# Patient Record
Sex: Male | Born: 1975 | Race: White | Hispanic: No | Smoking: Current every day smoker
Health system: Southern US, Community
[De-identification: ages and names within clinical notes are randomized; demographics above are authoritative.]

## PROBLEM LIST (undated history)

## (undated) DIAGNOSIS — K219 Gastro-esophageal reflux disease without esophagitis: Secondary | ICD-10-CM

## (undated) DIAGNOSIS — F419 Anxiety disorder, unspecified: Secondary | ICD-10-CM

## (undated) HISTORY — PX: OTHER SURGICAL HISTORY: SHX169

---

## 2004-11-08 ENCOUNTER — Emergency Department: Payer: Self-pay | Admitting: Emergency Medicine

## 2005-03-25 ENCOUNTER — Emergency Department: Payer: Self-pay | Admitting: Emergency Medicine

## 2005-07-01 ENCOUNTER — Emergency Department: Payer: Self-pay | Admitting: Emergency Medicine

## 2005-07-08 ENCOUNTER — Emergency Department: Payer: Self-pay | Admitting: Emergency Medicine

## 2006-01-12 ENCOUNTER — Emergency Department: Payer: Self-pay | Admitting: Emergency Medicine

## 2008-01-10 ENCOUNTER — Emergency Department: Payer: Self-pay | Admitting: Unknown Physician Specialty

## 2008-04-22 ENCOUNTER — Emergency Department: Payer: Self-pay | Admitting: Emergency Medicine

## 2008-04-23 ENCOUNTER — Emergency Department: Payer: Self-pay | Admitting: Emergency Medicine

## 2009-12-28 ENCOUNTER — Emergency Department: Payer: Self-pay | Admitting: Emergency Medicine

## 2010-03-08 ENCOUNTER — Emergency Department: Payer: Self-pay | Admitting: Emergency Medicine

## 2010-03-09 ENCOUNTER — Emergency Department: Payer: Self-pay | Admitting: Emergency Medicine

## 2010-05-04 ENCOUNTER — Emergency Department: Payer: Self-pay | Admitting: Emergency Medicine

## 2010-05-13 ENCOUNTER — Ambulatory Visit: Payer: Self-pay

## 2010-05-31 ENCOUNTER — Emergency Department (HOSPITAL_COMMUNITY)
Admission: EM | Admit: 2010-05-31 | Discharge: 2010-05-31 | Payer: Self-pay | Source: Home / Self Care | Admitting: Emergency Medicine

## 2010-06-13 ENCOUNTER — Emergency Department: Payer: Self-pay | Admitting: Unknown Physician Specialty

## 2010-06-18 ENCOUNTER — Emergency Department (HOSPITAL_COMMUNITY): Payer: Medicaid Other

## 2010-06-18 ENCOUNTER — Emergency Department (HOSPITAL_COMMUNITY)
Admission: EM | Admit: 2010-06-18 | Discharge: 2010-06-18 | Disposition: A | Discharge status: Home / Self Care | Payer: Medicaid Other | Attending: Emergency Medicine | Admitting: Emergency Medicine

## 2010-06-18 DIAGNOSIS — M25519 Pain in unspecified shoulder: Secondary | ICD-10-CM | POA: Insufficient documentation

## 2010-06-18 DIAGNOSIS — X500XXA Overexertion from strenuous movement or load, initial encounter: Secondary | ICD-10-CM | POA: Insufficient documentation

## 2010-06-18 DIAGNOSIS — K219 Gastro-esophageal reflux disease without esophagitis: Secondary | ICD-10-CM | POA: Insufficient documentation

## 2010-06-18 DIAGNOSIS — Y929 Unspecified place or not applicable: Secondary | ICD-10-CM | POA: Insufficient documentation

## 2010-06-18 DIAGNOSIS — E7800 Pure hypercholesterolemia, unspecified: Secondary | ICD-10-CM | POA: Insufficient documentation

## 2010-06-18 DIAGNOSIS — IMO0002 Reserved for concepts with insufficient information to code with codable children: Secondary | ICD-10-CM | POA: Insufficient documentation

## 2010-07-05 ENCOUNTER — Ambulatory Visit: Payer: Medicaid Other | Attending: Sports Medicine | Admitting: Physical Therapy

## 2010-09-03 ENCOUNTER — Inpatient Hospital Stay (INDEPENDENT_AMBULATORY_CARE_PROVIDER_SITE_OTHER)
Admission: RE | Admit: 2010-09-03 | Discharge: 2010-09-03 | Disposition: A | Discharge status: Home / Self Care | Payer: Medicaid Other | Source: Ambulatory Visit | Attending: Emergency Medicine | Admitting: Emergency Medicine

## 2010-09-03 DIAGNOSIS — R51 Headache: Secondary | ICD-10-CM

## 2012-07-18 ENCOUNTER — Emergency Department: Payer: Self-pay | Admitting: Emergency Medicine

## 2012-12-13 ENCOUNTER — Inpatient Hospital Stay: Payer: Self-pay | Admitting: Internal Medicine

## 2012-12-13 DIAGNOSIS — I369 Nonrheumatic tricuspid valve disorder, unspecified: Secondary | ICD-10-CM

## 2012-12-13 LAB — URINALYSIS, COMPLETE
Bilirubin,UR: NEGATIVE
Glucose,UR: NEGATIVE mg/dL (ref 0–75)
Granular Cast: 9
Leukocyte Esterase: NEGATIVE
Ph: 5 (ref 4.5–8.0)
Specific Gravity: 1.025 (ref 1.003–1.030)
WBC UR: 2 /HPF (ref 0–5)

## 2012-12-13 LAB — CK-MB: CK-MB: 37.4 ng/mL — ABNORMAL HIGH (ref 0.5–3.6)

## 2012-12-13 LAB — BASIC METABOLIC PANEL
Chloride: 110 mmol/L — ABNORMAL HIGH (ref 98–107)
Co2: 29 mmol/L (ref 21–32)
EGFR (Non-African Amer.): 29 — ABNORMAL LOW
Glucose: 96 mg/dL (ref 65–99)
Potassium: 4.1 mmol/L (ref 3.5–5.1)

## 2012-12-13 LAB — TROPONIN I: Troponin-I: 2.7 ng/mL — ABNORMAL HIGH

## 2012-12-13 LAB — COMPREHENSIVE METABOLIC PANEL
Bilirubin,Total: 0.8 mg/dL (ref 0.2–1.0)
Co2: 25 mmol/L (ref 21–32)
EGFR (Non-African Amer.): 21 — ABNORMAL LOW
SGPT (ALT): 43 U/L (ref 12–78)
Sodium: 136 mmol/L (ref 136–145)

## 2012-12-13 LAB — APTT: Activated PTT: >160 secs (ref 23.6–35.9)

## 2012-12-13 LAB — DRUG SCREEN, URINE
Benzodiazepine, Ur Scrn: POSITIVE (ref ?–200)
Cannabinoid 50 Ng, Ur ~~LOC~~: NEGATIVE (ref ?–50)
MDMA (Ecstasy)Ur Screen: NEGATIVE (ref ?–500)
Phencyclidine (PCP) Ur S: NEGATIVE (ref ?–25)
Tricyclic, Ur Screen: NEGATIVE (ref ?–1000)

## 2012-12-13 LAB — ETHANOL
Ethanol %: <0.003 % (ref 0.000–0.080)
Ethanol: <3 mg/dL

## 2012-12-13 LAB — PROTEIN / CREATININE RATIO, URINE: Creatinine, Urine: 319.9 mg/dL — ABNORMAL HIGH (ref 30.0–125.0)

## 2012-12-13 LAB — PHOSPHORUS: Phosphorus: 1.6 mg/dL — ABNORMAL LOW (ref 2.5–4.9)

## 2012-12-13 LAB — CBC
MCH: 29.7 pg (ref 26.0–34.0)
RBC: 5.44 10*6/uL (ref 4.40–5.90)

## 2012-12-14 DIAGNOSIS — R7989 Other specified abnormal findings of blood chemistry: Secondary | ICD-10-CM

## 2012-12-14 DIAGNOSIS — J9600 Acute respiratory failure, unspecified whether with hypoxia or hypercapnia: Secondary | ICD-10-CM

## 2012-12-14 LAB — CBC WITH DIFFERENTIAL/PLATELET
Basophil #: 0.1 10*3/uL (ref 0.0–0.1)
Basophil %: 0.5 %
Eosinophil #: 0.1 10*3/uL (ref 0.0–0.7)
Eosinophil %: 0.5 %
HGB: 12.3 g/dL — ABNORMAL LOW (ref 13.0–18.0)
MCH: 29.7 pg (ref 26.0–34.0)
Monocyte %: 3.6 %
Neutrophil #: 11.9 10*3/uL — ABNORMAL HIGH (ref 1.4–6.5)
Platelet: 180 10*3/uL (ref 150–440)

## 2012-12-14 LAB — BASIC METABOLIC PANEL
BUN: 27 mg/dL — ABNORMAL HIGH (ref 7–18)
Co2: 27 mmol/L (ref 21–32)
Creatinine: 1.67 mg/dL — ABNORMAL HIGH (ref 0.60–1.30)
EGFR (African American): >60
Glucose: 104 mg/dL — ABNORMAL HIGH (ref 65–99)

## 2012-12-14 LAB — CK: CK, Total: 3287 U/L — ABNORMAL HIGH (ref 35–232)

## 2012-12-14 LAB — MAGNESIUM: Magnesium: 1.8 mg/dL

## 2012-12-14 LAB — TSH: Thyroid Stimulating Horm: 0.649 u[IU]/mL

## 2012-12-15 LAB — BASIC METABOLIC PANEL
Anion Gap: 8 (ref 7–16)
BUN: 15 mg/dL (ref 7–18)
Chloride: 114 mmol/L — ABNORMAL HIGH (ref 98–107)
Potassium: 3.5 mmol/L (ref 3.5–5.1)
Sodium: 147 mmol/L — ABNORMAL HIGH (ref 136–145)

## 2012-12-15 LAB — POTASSIUM: Potassium: 3.3 mmol/L — ABNORMAL LOW (ref 3.5–5.1)

## 2012-12-15 LAB — MAGNESIUM: Magnesium: 2.3 mg/dL

## 2012-12-15 LAB — CK: CK, Total: 1191 U/L — ABNORMAL HIGH (ref 35–232)

## 2012-12-15 LAB — PHOSPHORUS: Phosphorus: 1.6 mg/dL — ABNORMAL LOW (ref 2.5–4.9)

## 2012-12-16 LAB — BASIC METABOLIC PANEL
BUN: 8 mg/dL (ref 7–18)
Co2: 27 mmol/L (ref 21–32)
Creatinine: 0.91 mg/dL (ref 0.60–1.30)
EGFR (Non-African Amer.): >60
Sodium: 146 mmol/L — ABNORMAL HIGH (ref 136–145)

## 2012-12-16 LAB — PHOSPHORUS: Phosphorus: 2 mg/dL — ABNORMAL LOW (ref 2.5–4.9)

## 2012-12-16 LAB — VANCOMYCIN, TROUGH: Vancomycin, Trough: 7 ug/mL — ABNORMAL LOW (ref 10–20)

## 2012-12-17 LAB — CBC WITH DIFFERENTIAL/PLATELET
Bands: 2 %
Eosinophil: 6 %
HGB: 10.1 g/dL — ABNORMAL LOW (ref 13.0–18.0)
Lymphocytes: 17 %
MCH: 29.6 pg (ref 26.0–34.0)
Platelet: 200 10*3/uL (ref 150–440)
RBC: 3.43 10*6/uL — ABNORMAL LOW (ref 4.40–5.90)
RDW: 13.8 % (ref 11.5–14.5)
WBC: 12.7 10*3/uL — ABNORMAL HIGH (ref 3.8–10.6)

## 2012-12-17 LAB — BASIC METABOLIC PANEL
BUN: 10 mg/dL (ref 7–18)
Calcium, Total: 8.5 mg/dL (ref 8.5–10.1)
Chloride: 112 mmol/L — ABNORMAL HIGH (ref 98–107)
Co2: 30 mmol/L (ref 21–32)
Creatinine: 0.9 mg/dL (ref 0.60–1.30)
EGFR (Non-African Amer.): >60
Sodium: 144 mmol/L (ref 136–145)

## 2012-12-17 LAB — MAGNESIUM: Magnesium: 2.1 mg/dL

## 2012-12-17 LAB — RAPID HIV-1/2 QL/CONFIRM: HIV-1/2,Rapid Ql: NEGATIVE

## 2012-12-17 LAB — PHOSPHORUS: Phosphorus: 3.4 mg/dL (ref 2.5–4.9)

## 2012-12-18 LAB — BASIC METABOLIC PANEL
Anion Gap: 4 — ABNORMAL LOW (ref 7–16)
BUN: 19 mg/dL — ABNORMAL HIGH (ref 7–18)
Co2: 31 mmol/L (ref 21–32)
EGFR (African American): >60
EGFR (Non-African Amer.): >60
Glucose: 111 mg/dL — ABNORMAL HIGH (ref 65–99)
Osmolality: 284 (ref 275–301)
Potassium: 4 mmol/L (ref 3.5–5.1)

## 2012-12-18 LAB — HEPATIC FUNCTION PANEL A (ARMC)
Albumin: 1.6 g/dL — ABNORMAL LOW (ref 3.4–5.0)
Bilirubin, Direct: 0.5 mg/dL — ABNORMAL HIGH (ref 0.00–0.20)
Bilirubin,Total: 0.8 mg/dL (ref 0.2–1.0)
SGOT(AST): 31 U/L (ref 15–37)
SGPT (ALT): 26 U/L (ref 12–78)

## 2012-12-18 LAB — PHOSPHORUS: Phosphorus: 3.6 mg/dL (ref 2.5–4.9)

## 2012-12-19 LAB — FERRITIN: Ferritin (ARMC): 431 ng/mL — ABNORMAL HIGH (ref 8–388)

## 2012-12-19 LAB — TRIGLYCERIDES: Triglycerides: 200 mg/dL (ref 0–200)

## 2012-12-19 LAB — IRON AND TIBC
Iron Bind.Cap.(Total): 136 ug/dL — ABNORMAL LOW (ref 250–450)
Iron Saturation: 17 %
Iron: 23 ug/dL — ABNORMAL LOW (ref 65–175)
Unbound Iron-Bind.Cap.: 113 ug/dL

## 2012-12-19 LAB — BASIC METABOLIC PANEL
Calcium, Total: 8.5 mg/dL (ref 8.5–10.1)
Chloride: 105 mmol/L (ref 98–107)
Co2: 32 mmol/L (ref 21–32)
Creatinine: 0.88 mg/dL (ref 0.60–1.30)
EGFR (African American): >60
EGFR (Non-African Amer.): >60
Glucose: 91 mg/dL (ref 65–99)
Osmolality: 283 (ref 275–301)
Potassium: 3.8 mmol/L (ref 3.5–5.1)

## 2012-12-19 LAB — CBC WITH DIFFERENTIAL/PLATELET
Bands: 6 %
Eosinophil: 4 %
HCT: 25.1 % — ABNORMAL LOW (ref 40.0–52.0)
HGB: 8.6 g/dL — ABNORMAL LOW (ref 13.0–18.0)
Lymphocytes: 15 %
MCH: 29.5 pg (ref 26.0–34.0)
MCHC: 34.5 g/dL (ref 32.0–36.0)
Metamyelocyte: 3 %
Monocytes: 3 %
Platelet: 261 10*3/uL (ref 150–440)
RBC: 2.93 10*6/uL — ABNORMAL LOW (ref 4.40–5.90)
RDW: 13.2 % (ref 11.5–14.5)
WBC: 12.2 10*3/uL — ABNORMAL HIGH (ref 3.8–10.6)

## 2012-12-19 LAB — FOLATE: Folic Acid: 4.9 ng/mL (ref 3.1–100.0)

## 2012-12-19 LAB — PHOSPHORUS: Phosphorus: 2.5 mg/dL (ref 2.5–4.9)

## 2012-12-19 LAB — HEPATIC FUNCTION PANEL A (ARMC)
Alkaline Phosphatase: 230 U/L — ABNORMAL HIGH (ref 50–136)
Bilirubin, Direct: 0.3 mg/dL — ABNORMAL HIGH (ref 0.00–0.20)
Bilirubin,Total: 0.6 mg/dL (ref 0.2–1.0)
SGOT(AST): 38 U/L — ABNORMAL HIGH (ref 15–37)
Total Protein: 5.4 g/dL — ABNORMAL LOW (ref 6.4–8.2)

## 2012-12-19 LAB — HEMOGLOBIN: HGB: 8.8 g/dL — ABNORMAL LOW

## 2012-12-20 LAB — CBC WITH DIFFERENTIAL/PLATELET
Bands: 3 %
HGB: 9 g/dL — ABNORMAL LOW (ref 13.0–18.0)
Lymphocytes: 14 %
MCHC: 34.6 g/dL (ref 32.0–36.0)
Metamyelocyte: 2 %
Myelocyte: 1 %
Platelet: 359 10*3/uL (ref 150–440)
Segmented Neutrophils: 72 %
WBC: 11.6 10*3/uL — ABNORMAL HIGH (ref 3.8–10.6)

## 2012-12-20 LAB — BASIC METABOLIC PANEL
Anion Gap: 2 — ABNORMAL LOW (ref 7–16)
BUN: 21 mg/dL — ABNORMAL HIGH (ref 7–18)
Creatinine: 0.71 mg/dL (ref 0.60–1.30)
EGFR (African American): >60
EGFR (Non-African Amer.): >60
Glucose: 97 mg/dL (ref 65–99)
Sodium: 141 mmol/L (ref 136–145)

## 2012-12-21 DIAGNOSIS — I38 Endocarditis, valve unspecified: Secondary | ICD-10-CM

## 2012-12-21 DIAGNOSIS — J9600 Acute respiratory failure, unspecified whether with hypoxia or hypercapnia: Secondary | ICD-10-CM

## 2012-12-21 LAB — COMPREHENSIVE METABOLIC PANEL
Albumin: 1.6 g/dL — ABNORMAL LOW (ref 3.4–5.0)
Alkaline Phosphatase: 219 U/L — ABNORMAL HIGH (ref 50–136)
Anion Gap: 2 — ABNORMAL LOW (ref 7–16)
Chloride: 104 mmol/L (ref 98–107)
Co2: 36 mmol/L — ABNORMAL HIGH (ref 21–32)
Creatinine: 0.74 mg/dL (ref 0.60–1.30)
EGFR (African American): >60
Potassium: 3.8 mmol/L (ref 3.5–5.1)
SGPT (ALT): 32 U/L (ref 12–78)
Total Protein: 5.6 g/dL — ABNORMAL LOW (ref 6.4–8.2)

## 2012-12-21 LAB — PHOSPHORUS: Phosphorus: 3.9 mg/dL (ref 2.5–4.9)

## 2012-12-21 LAB — CBC WITH DIFFERENTIAL/PLATELET
Eosinophil %: 2.2 %
HCT: 27.2 % — ABNORMAL LOW (ref 40.0–52.0)
HGB: 9.4 g/dL — ABNORMAL LOW (ref 13.0–18.0)
Lymphocyte %: 10 %
MCH: 29.4 pg (ref 26.0–34.0)
MCHC: 34.7 g/dL (ref 32.0–36.0)
Monocyte #: 1.1 x10 3/mm — ABNORMAL HIGH (ref 0.2–1.0)
Neutrophil #: 11.4 10*3/uL — ABNORMAL HIGH (ref 1.4–6.5)
Neutrophil %: 79.6 %
RDW: 13.5 % (ref 11.5–14.5)

## 2012-12-21 LAB — PRO B NATRIURETIC PEPTIDE: B-Type Natriuretic Peptide: 404 pg/mL — ABNORMAL HIGH (ref 0–125)

## 2012-12-21 LAB — MAGNESIUM: Magnesium: 2.1 mg/dL

## 2012-12-22 DIAGNOSIS — I495 Sick sinus syndrome: Secondary | ICD-10-CM

## 2012-12-22 LAB — BASIC METABOLIC PANEL
BUN: 25 mg/dL — ABNORMAL HIGH (ref 7–18)
Creatinine: 0.59 mg/dL — ABNORMAL LOW (ref 0.60–1.30)
EGFR (African American): >60
EGFR (Non-African Amer.): >60
Potassium: 3.6 mmol/L (ref 3.5–5.1)

## 2012-12-22 LAB — CULTURE, BLOOD (SINGLE)

## 2012-12-22 LAB — CBC WITH DIFFERENTIAL/PLATELET
Basophil #: 0.1 10*3/uL (ref 0.0–0.1)
Basophil %: 0.5 %
Eosinophil #: 0.3 10*3/uL (ref 0.0–0.7)
HCT: 26.7 % — ABNORMAL LOW (ref 40.0–52.0)
Lymphocyte %: 14.1 %
MCHC: 34.8 g/dL (ref 32.0–36.0)
MCV: 84 fL (ref 80–100)
Monocyte #: 1.1 x10 3/mm — ABNORMAL HIGH (ref 0.2–1.0)
Monocyte %: 6.2 %
Neutrophil #: 13.2 10*3/uL — ABNORMAL HIGH (ref 1.4–6.5)
Neutrophil %: 77.4 %
Platelet: 532 10*3/uL — ABNORMAL HIGH (ref 150–440)
RBC: 3.18 10*6/uL — ABNORMAL LOW (ref 4.40–5.90)
WBC: 17.1 10*3/uL — ABNORMAL HIGH (ref 3.8–10.6)

## 2012-12-23 LAB — CBC WITH DIFFERENTIAL/PLATELET
Basophil #: 0.3 10*3/uL — ABNORMAL HIGH (ref 0.0–0.1)
Basophil %: 1 %
Eosinophil #: 0.3 10*3/uL (ref 0.0–0.7)
Eosinophil %: 0.9 %
HCT: 27 % — ABNORMAL LOW (ref 40.0–52.0)
MCH: 28.5 pg (ref 26.0–34.0)
MCHC: 33.5 g/dL (ref 32.0–36.0)
Neutrophil %: 86.3 %
Platelet: 576 10*3/uL — ABNORMAL HIGH (ref 150–440)
RBC: 3.17 10*6/uL — ABNORMAL LOW (ref 4.40–5.90)
RDW: 13.7 % (ref 11.5–14.5)
WBC: 29.1 10*3/uL — ABNORMAL HIGH (ref 3.8–10.6)

## 2012-12-23 LAB — BASIC METABOLIC PANEL
Anion Gap: 3 — ABNORMAL LOW (ref 7–16)
Calcium, Total: 8.3 mg/dL — ABNORMAL LOW (ref 8.5–10.1)
Co2: 34 mmol/L — ABNORMAL HIGH (ref 21–32)
Creatinine: 0.88 mg/dL (ref 0.60–1.30)
EGFR (African American): >60
EGFR (Non-African Amer.): >60
Osmolality: 281 (ref 275–301)
Potassium: 3.1 mmol/L — ABNORMAL LOW (ref 3.5–5.1)
Sodium: 138 mmol/L (ref 136–145)

## 2012-12-23 LAB — POTASSIUM: Potassium: 3.8 mmol/L (ref 3.5–5.1)

## 2012-12-23 LAB — MAGNESIUM: Magnesium: 1.8 mg/dL

## 2012-12-23 LAB — PHOSPHORUS: Phosphorus: 3.3 mg/dL (ref 2.5–4.9)

## 2012-12-24 LAB — CBC WITH DIFFERENTIAL/PLATELET
Bands: 3 %
HCT: 27.7 % — ABNORMAL LOW (ref 40.0–52.0)
Lymphocytes: 13 %
MCH: 28.9 pg (ref 26.0–34.0)
MCHC: 34 g/dL (ref 32.0–36.0)
MCV: 85 fL (ref 80–100)
Metamyelocyte: 2 %
RBC: 3.25 10*6/uL — ABNORMAL LOW (ref 4.40–5.90)
RDW: 13.6 % (ref 11.5–14.5)
Segmented Neutrophils: 76 %
WBC: 23.1 10*3/uL — ABNORMAL HIGH (ref 3.8–10.6)

## 2012-12-24 LAB — BASIC METABOLIC PANEL
Anion Gap: 5 — ABNORMAL LOW (ref 7–16)
BUN: 25 mg/dL — ABNORMAL HIGH (ref 7–18)
Calcium, Total: 8.6 mg/dL (ref 8.5–10.1)
Co2: 33 mmol/L — ABNORMAL HIGH (ref 21–32)
EGFR (African American): >60
Osmolality: 282 (ref 275–301)
Potassium: 3.5 mmol/L (ref 3.5–5.1)

## 2012-12-24 LAB — CALCIUM: Calcium, Total: 8.2 mg/dL — ABNORMAL LOW (ref 8.5–10.1)

## 2012-12-24 LAB — MAGNESIUM: Magnesium: 1.9 mg/dL

## 2012-12-25 LAB — CBC WITH DIFFERENTIAL/PLATELET
Bands: 1 %
HCT: 29.8 % — ABNORMAL LOW (ref 40.0–52.0)
HGB: 10.1 g/dL — ABNORMAL LOW (ref 13.0–18.0)
MCH: 28.5 pg (ref 26.0–34.0)
MCHC: 34 g/dL (ref 32.0–36.0)
MCV: 84 fL (ref 80–100)
Metamyelocyte: 2 %
RDW: 13.3 % (ref 11.5–14.5)
Segmented Neutrophils: 84 %

## 2012-12-25 LAB — BASIC METABOLIC PANEL
BUN: 20 mg/dL — ABNORMAL HIGH (ref 7–18)
Creatinine: 0.63 mg/dL (ref 0.60–1.30)
EGFR (African American): >60
EGFR (Non-African Amer.): >60
Glucose: 119 mg/dL — ABNORMAL HIGH (ref 65–99)
Osmolality: 281 (ref 275–301)
Sodium: 139 mmol/L (ref 136–145)

## 2012-12-25 LAB — MAGNESIUM: Magnesium: 2.1 mg/dL

## 2012-12-25 LAB — PHOSPHORUS: Phosphorus: 2.1 mg/dL — ABNORMAL LOW (ref 2.5–4.9)

## 2012-12-25 LAB — POTASSIUM: Potassium: 3.5 mmol/L (ref 3.5–5.1)

## 2012-12-26 LAB — BASIC METABOLIC PANEL
Calcium, Total: 8.1 mg/dL — ABNORMAL LOW (ref 8.5–10.1)
Chloride: 108 mmol/L — ABNORMAL HIGH (ref 98–107)
Creatinine: 0.55 mg/dL — ABNORMAL LOW (ref 0.60–1.30)
EGFR (African American): >60
EGFR (Non-African Amer.): >60
Glucose: 104 mg/dL — ABNORMAL HIGH (ref 65–99)
Osmolality: 278 (ref 275–301)
Sodium: 139 mmol/L (ref 136–145)

## 2012-12-26 LAB — PHOSPHORUS: Phosphorus: 2.6 mg/dL (ref 2.5–4.9)

## 2012-12-26 LAB — CBC WITH DIFFERENTIAL/PLATELET
Basophil #: 0.1 10*3/uL (ref 0.0–0.1)
Basophil %: 0.6 %
Eosinophil %: 0.8 %
HGB: 10.1 g/dL — ABNORMAL LOW (ref 13.0–18.0)
Lymphocyte #: 2.7 10*3/uL (ref 1.0–3.6)
MCH: 29.2 pg (ref 26.0–34.0)
Monocyte #: 1.2 x10 3/mm — ABNORMAL HIGH (ref 0.2–1.0)
RDW: 13.2 % (ref 11.5–14.5)
WBC: 19.2 10*3/uL — ABNORMAL HIGH (ref 3.8–10.6)

## 2012-12-26 LAB — MAGNESIUM: Magnesium: 2.1 mg/dL

## 2012-12-27 LAB — CBC WITH DIFFERENTIAL/PLATELET
HCT: 27.5 % — ABNORMAL LOW (ref 40.0–52.0)
Lymphocytes: 17 %
MCHC: 35 g/dL (ref 32.0–36.0)
Metamyelocyte: 3 %
Myelocyte: 2 %
Platelet: 750 10*3/uL — ABNORMAL HIGH (ref 150–440)
RBC: 3.3 10*6/uL — ABNORMAL LOW (ref 4.40–5.90)
RDW: 13.1 % (ref 11.5–14.5)
Segmented Neutrophils: 70 %
WBC: 16.4 10*3/uL — ABNORMAL HIGH (ref 3.8–10.6)

## 2012-12-27 LAB — BASIC METABOLIC PANEL
Calcium, Total: 8.4 mg/dL — ABNORMAL LOW (ref 8.5–10.1)
Creatinine: 0.56 mg/dL — ABNORMAL LOW (ref 0.60–1.30)
EGFR (African American): >60
Osmolality: 280 (ref 275–301)
Potassium: 3.4 mmol/L — ABNORMAL LOW (ref 3.5–5.1)

## 2012-12-27 LAB — CULTURE, BLOOD (SINGLE)

## 2012-12-29 ENCOUNTER — Ambulatory Visit: Payer: Self-pay | Admitting: Neurology

## 2012-12-29 LAB — BASIC METABOLIC PANEL
Anion Gap: 7 (ref 7–16)
Co2: 24 mmol/L (ref 21–32)
Creatinine: 0.69 mg/dL (ref 0.60–1.30)
EGFR (African American): >60
EGFR (Non-African Amer.): >60
Glucose: 93 mg/dL (ref 65–99)
Potassium: 3.2 mmol/L — ABNORMAL LOW (ref 3.5–5.1)
Sodium: 139 mmol/L (ref 136–145)

## 2012-12-30 LAB — CBC WITH DIFFERENTIAL/PLATELET
Bands: 3 %
Comment - H1-Com3: NORMAL
Eosinophil: 1 %
MCH: 29.2 pg (ref 26.0–34.0)
MCHC: 35.1 g/dL (ref 32.0–36.0)
MCV: 83 fL (ref 80–100)
Metamyelocyte: 4 %
Monocytes: 11 %
Platelet: 760 10*3/uL — ABNORMAL HIGH (ref 150–440)
Segmented Neutrophils: 62 %
WBC: 17 10*3/uL — ABNORMAL HIGH (ref 3.8–10.6)

## 2013-01-02 LAB — CBC WITH DIFFERENTIAL/PLATELET
Basophil %: 0.7 %
Comment - H1-Com2: NORMAL
HCT: 34.6 % — ABNORMAL LOW (ref 40.0–52.0)
Lymphocyte #: 2.9 10*3/uL (ref 1.0–3.6)
Lymphocyte %: 20.4 %
MCH: 29.1 pg (ref 26.0–34.0)
MCHC: 34.5 g/dL (ref 32.0–36.0)
MCV: 84 fL (ref 80–100)
Monocyte %: 5 %
Neutrophil %: 71.6 %
Platelet: 633 10*3/uL — ABNORMAL HIGH (ref 150–440)
RBC: 4.11 10*6/uL — ABNORMAL LOW (ref 4.40–5.90)
RDW: 13.7 % (ref 11.5–14.5)

## 2013-01-02 LAB — BASIC METABOLIC PANEL
BUN: 9 mg/dL (ref 7–18)
Calcium, Total: 9.3 mg/dL (ref 8.5–10.1)
Co2: 22 mmol/L (ref 21–32)
Creatinine: 0.62 mg/dL (ref 0.60–1.30)
EGFR (African American): >60
EGFR (Non-African Amer.): >60
Glucose: 98 mg/dL (ref 65–99)
Potassium: 3.7 mmol/L (ref 3.5–5.1)
Sodium: 139 mmol/L (ref 136–145)

## 2013-03-17 ENCOUNTER — Emergency Department: Payer: Self-pay | Admitting: Internal Medicine

## 2013-06-24 ENCOUNTER — Emergency Department: Payer: Self-pay | Admitting: Emergency Medicine

## 2013-08-16 ENCOUNTER — Emergency Department: Payer: Self-pay | Admitting: Emergency Medicine

## 2013-09-13 ENCOUNTER — Ambulatory Visit: Payer: Self-pay | Admitting: Family Medicine

## 2014-02-19 ENCOUNTER — Emergency Department: Payer: Self-pay | Admitting: Emergency Medicine

## 2014-07-17 ENCOUNTER — Emergency Department: Payer: Self-pay | Admitting: Internal Medicine

## 2014-08-22 NOTE — H&P (Signed)
PATIENT NAME:  Philip Owen, Philip Owen MR#:  [id number] DATE OF BIRTH:  1976-04-17  DATE OF ADMISSION:  12/13/2012  REFERRING PHYSICIAN: Rebecka Apley, MD  PRIMARY CARE PHYSICIAN: None.   CHIEF COMPLAINT: Unresponsiveness.   HISTORY OF PRESENT ILLNESS: A 39 year old gentleman with history of obstructive sleep apnea, clinically diagnosed, no use of CPAP, anxiety, GERD, shoulder osteoarthritis and hyperlipidemia, who comes today with a history of being sick of his stomach 2 nights ago, had 1 episode of large vomiting which was colored dark, black to green. After that, he slept for over 24 hours. His wife was not concerned about this because he does that all the time whenever he is not feeling good. At 3:00 a.m. in the morning, he started gasping for air, and he was not able to be aroused. The wife noticed that he was having some difficulty breathing, with increase of respiratory rate and increase of sounds whenever he breathed, stridulous as described, for which she called EMS. EMS told her to start CPR. The patient was given CPR by the wife, but whenever EMS arrived, he had a pulse, but he was unresponsive. CT scan of the head was done here in the ER and was negative. He was acidotic with respiratory acidosis and a potassium of 7.7, acute kidney injury with a creatinine of 3.5. The patient was given insulin, D50, Kayexalate, boluses of NS and also calcium chloride. He was started on Zosyn and vancomycin for aspiration pneumonia. The patient is going to be transferred to the CCU, and he is sedated on propofol. I had a long discussion with his wife. The patient is positive for benzodiazepines, methadone and cocaine. The wife states that he has never used any drugs. His troponin is slightly elevated likely due to cocaine use. We are going to cycle those. His AST is elevated at 90. The wife states that he does not drink much, but we are going to start him on CIWA protocol. His ethanol level is negative. Other  than that, his blood pressure is starting to drop, for which we are going to proceed to put in a central line and start Levophed if necessary. The patient is going to be critically ill in the Critical Care Unit. The wife cannot give me any further information about what is going on, but she feels like he vomited overnight and swallowed it back again, for which this is very likely to be an aspiration. His chest x-ray showed significant whiteout of the right lung, and he has been requiring a PEEP of 12 to be ventilated.   REVIEW OF SYSTEMS: Unable to obtain due to altered mental status.   PAST MEDICAL HISTORY:  1. Obstructive of sleep apnea; apparently clinical diagnosis, never had a sleep study. He does not use a CPAP.  2. Anxiety.  3. GERD.  4. Chronic shoulder pain.  5. Hyperlipidemia.   ALLERGIES: NOVOCAINE, NAPROXEN AND ULTRAM.   SURGICAL HISTORY: None.   FAMILY HISTORY: Negative for MI. Positive for cancer in his mom of unknown source. No other family history.   MEDICATIONS: Xanax and Protonix.  SOCIAL HISTORY: The patient drinks apparently on the weekends. He drinks from 2 beers to a 6-pack or sometimes more, but the wife states that they do not drink every day. He smokes 1/2-pack a day. He has been smoking since his 29s. He is positive for cocaine, methadone and benzodiazepines on his urinalysis. The wife states that he does not use drugs, and she is with him all  the time. He is unemployed.   PHYSICAL EXAMINATION:  VITAL SIGNS: Blood pressure right now 80s over 60s. Heart rate around 110 to 112. Respirations around 22. Oxygen saturation right now is 99% on FiO2 of I believe 100%. He is on assist control with a PEEP of 12, rate of 20, tidal volume of 450.  GENERAL: The patient is unresponsive, sedated on propofol.  HEENT: Pupils pinpoint, sluggish reaction. Extraocular movements unable to assess. Anicteric sclerae. Pink conjunctivae. No oral lesions. No oropharyngeal exudates. ET tube in  place. No major secretions aspirated from the ET tube.  NECK: Supple. No JVD. No thyromegaly. No adenopathy. No carotid bruits. No rigidity.  CARDIOVASCULAR: Tachycardic. Regular rate and rhythm. No murmurs, rubs or gallops are appreciated. No displacement of PMI.  LUNGS: Significant decrease of respiratory sounds at the level of the right lower lung. The patient has some rales on the right base, but good aeration, good expansion with mechanical ventilation. Left lung is clear. No dullness to percussion.  ABDOMEN: Soft, nontender, nondistended. No hepatosplenomegaly. No masses. Bowel sounds are positive.  GENITAL: Negative for external lesions.  EXTREMITIES: No edema, cyanosis or clubbing. No track marks. No ulcers.  SKIN: Without any rashes or petechiae. The patient is slightly diaphoretic.  LYMPHATIC: Negative for lymphadenopathy in neck or supraclavicular areas.  NEUROLOGIC: Unable to fully assess due to sedation, but the patient was moving 4 extremities earlier, withdrawing to pain. Right now, he is deeply sedated on propofol. Babinski is upright.  PSYCHIATRIC: Mood: Unable to assess due to sedation.  MUSCULOSKELETAL: No significant joint effusions. Normal nails. No hemorrhages on nail beds. No Osler nodules.   LABORATORY RESULTS: Glucose 117, BUN 33, creatinine 3.5, sodium 136, potassium 7.7, CO2 25. Other electrolytes overall within normal limits. Calcium is 8.7. LFTs with AST of 90, others are normal. Troponin is 0.19. UDS positive for benzos, cocaine and methadone. White count is 12,000, hemoglobin 16, platelet count is 358. Urinalysis shows 2 white blood cells, negative nitrites, negative leukocyte esterase. Initial ABG: pH 7.23, pCO2 of 53, pO2 of 54 with FiO2 of 100. At this moment, his pH is 7.31, pCO2 is 50 and SpO2 is 80. Right now, he is saturating 100%, and he is on FiO2 of 100% as well. He is assist control 20, 450 and a PEEP of 12.   IMAGING: Chest x-ray, as mentioned above, right  lung aspiration pneumonia.   ELECTROCARDIOGRAM: Showed hyperacute T waves initially due to hyperkalemia. After treatment, actually the T waves came down, and they are normal now. Now has sinus tachycardia, no ST depression or elevation.   ASSESSMENT AND PLAN: This is a 39 year old gentleman, apparently healthy, who comes with unresponsiveness, acute respiratory failure and aspiration pneumonia.   1. Unresponsiveness. This is likely due to multiple issues: One, drugs with methadone. He did not respond to Narcan. He is now intubated, and he is now on propofol. He likely has metabolic encephalopathy due to these issues. The other reason could be also the aspiration pneumonia with hypoxemia and respiratory distress. At this moment, his CT scan is normal. We are unable to give a full prognosis on any type of ischemic injury of the brain like anoxic encephalopathy since the patient has been so hypoxic prior to coming over here, prior to being intubated. This is high a probability. We are going to continue to monitor and try to decrease sedation as his pneumonia clears up to evaluate this. Repeat CT scan if necessary.  2. Systemic inflammatory response  syndrome, tachycardia, increased white blood cells, tachypnea, unresponsiveness, acute kidney injury. This is likely due to aspiration. We are going to cover with Zosyn. The patient received 1 dose of vancomycin. Since he has acute kidney injury, this could be sufficient, and I am not going to continue this medication as this is clearcut aspiration. He does have aspiration pneumonia, not community-acquired pneumonia, not hospital-acquired pneumonia.  3. Acute respiratory failure. This is likely secondary to aspiration. The patient requiring high PEEPs. We are starting to see some changes and improvement in his respiratory pattern, for which I think we can wean off the PEEP. If not, we are going to change settings to pressure control. Pulmonary consult has been  requested. Continue nebulizers with albuterol and Atrovent to help secretions with MDI since the patient is intubated. Try to obtain a sputum culture if possible. The patient likely needs to be intubated for the next 24 to 48 hours.  4. Increased white blood cells, likely due to aspiration. We are going to follow up.  5. Acute kidney injury, likely due to acute tubular necrosis secondary to aspiration, hypoxia and other issues, including not able to drink anything for over 24 hours since he was unresponsive. The patient receiving IV fluids, large amounts. I am going to check a CK to rule out also rhabdomyolysis and check a myoglobin. Nephrology consultation obtained. The patient actually has been taking ibuprofen. We are going to avoid these medications.  6. Hyperkalemia, really high levels of 7.7. The patient has changes on his EKG. Calcium chloride has been given. The patient actually has changes on his EKG that have resolved.  7. The patient is a full code.  8. Drug use with cocaine. Avoid beta blockers. Troponin is elevated likely due to cocaine use. Consider benzodiazepines.  9. We are going to get an echo. Consult cardiology as needed.  10. Other medical issues seems to be stable. The patient is starting to show decrease of blood pressure. We are going to bolus him and put in a central line.   TIME SPENT: I spent about 60 minutes with this patient so far of critical care time. The patient critically ill with risk of potential cardiovascular collapse.   ____________________________ Felipa Furnace, MD rsg:OSi D: 12/13/2012 07:57:36 ET T: 12/13/2012 08:26:59 ET JOB#: [id number]  cc: Felipa Furnace, MD, <Dictator> Darcell Yacoub Juanda Chance MD ELECTRONICALLY SIGNED 12/15/2012 15:32

## 2014-08-22 NOTE — Consult Note (Signed)
Brief Consult Note: Diagnosis: Aspiration PNA, fevers, rash.   Patient was seen by consultant.   Consult note dictated.   Recommend further assessment or treatment.   Orders entered.   Comments: Rash likely from zosyn Rec check lfts stop zosyn and vanco cont levo and add clinda for anaerobic coverage.  This is more than enough coverage for the Group C and F strep which are likely from oral flora.  I will follow with you.Thank you for consult.  Electronic Signatures: Dierdre HarnessFitzgerald, David Patrick (MD)  (Signed 19-Aug-14 12:59)  Authored: Brief Consult Note   Last Updated: 19-Aug-14 12:59 by Dierdre HarnessFitzgerald, David Patrick (MD)

## 2014-08-22 NOTE — Consult Note (Signed)
Brief Consult Note: Diagnosis: Multifactorial delirium resolving.   Patient was seen by consultant.   Recommend further assessment or treatment.   Orders entered.   Comments: Attempted to see the patient again but he did not feel it was necessary to talk. He invited me to "come back on Tuesday". Will try again on Monday.   The patient seems much improved. He is able to hold a brief conversation. He is focused o getting a wheel chair and going home. He denies psychotic symptoms but reportedly he is still hallucinating and agitated at times.   PLAN: 1. We started low dose risperdal for delirium but it was discontinued by his primary attending except for the evening dose.   2. Will follow on Monday.  Electronic Signatures: Kristine LineaPucilowska, Conni Knighton (MD)  (Signed 01-Sep-14 00:23)  Authored: Brief Consult Note   Last Updated: 01-Sep-14 00:23 by Kristine LineaPucilowska, Jodie Leiner (MD)

## 2014-08-22 NOTE — Consult Note (Signed)
Referring Physician:  James Ivanoff, Roselie Awkward :   Primary Care Physician:  James Ivanoff, [facility] : [facility], [address], [place], [place] [address], [place] [phone]  Reason for Consult: Admit Date: 13-Dec-2012  Chief Complaint: altered mental status  Reason for Consult: altered mental status   History of Present Illness: History of Present Illness:   39 yo RHD M who was noted to be vomiting by wife and then became unresponsive.  Wife tried to perform CPR until EMS arrived who resumed this from her and intubated pt on the scene.  Pt was weaned off vent but still not cognitively quite right.  Per PT, pt does not always follow commands and is unable to ambulate.  Family notes that he has been having hallucinations and delusions as well.  Pt states that he knows he has memory problems but [place] unable to express the degree to which.  ROS:  Review of Systems   unobtainable secondary to mental status  Past Medical/Surgical Hx:  Hyperlipidemia:   HTN:   Questionable sleep apnea:   Panic Attacks:   anxiety:   GERD - Esophageal Reflux:   Denies surgical history.:   Past Medical/ Surgical Hx:  Past Medical History as above   Past Surgical History as above   Home Medications: Medication Instructions Last Modified Date/Time  Protonix 40 mg oral delayed release tablet 1 tab(s) orally once a day 14-Aug-14 07:42  Xanax 1 mg oral tablet 1 tab(s) orally 3 times a day 14-Aug-14 07:42   Allergies:  Naproxen: Itching  Nubain: Itching, Other  Ultram: Itching  Zosyn: Rash  Allergies:  Allergies NKDA   Social/Family History: Employment Status: currently employed  Lives With: spouse  Living Arrangements: apartment  Social History: + tob, social EtOH, ??? drug use  Family History: no seizures   Vital Signs: **Vital Signs.:   29-Aug-14 13:34  Vital Signs Type Routine  Temperature Temperature (F) 98.6  Celsius 37  Pulse Pulse 68  Respirations Respirations  20  Systolic BP Systolic BP 030  Diastolic BP (mmHg) Diastolic BP (mmHg) 88  Mean BP 113  Pulse Ox % Pulse Ox % 95  Oxygen Delivery 3L   Physical Exam: General: mild distress, slightly overweight  HEENT: normocephalic, sclera nonicteric, oropharynx clear  Neck: supple, no JVD, no bruits  Chest: CTA B, no wheezing, good movement  Cardiac: RRR, no murmurs, 1+ edema in lower ext, 2+ pulses  Extremities: no C/C;  mild contractures on R   Neurologic Exam: Mental Status: alert and oriented x 2 not time, moderate dysarthria, cognitive slowing with difficulty with complex commands;  nl naming and repetition, MMSE 18/30 ( can not write or draw, concentration poor with serial sevens, mild orientation misses)  Cranial Nerves: pupils 5m B and reactive, EOMI, nl VF, slight R droop, tongue midline  Motor Exam: 4/5 B but moves L>R, increased tone on R, occassional myoclonus noted  Deep Tendon Reflexes: 3+/4 B, B babinskis  Sensory Exam: intact to light touch and vibration  Coordination: unable to touch nose   Lab Results: Thyroid:  15-Aug-14 04:15   Thyroid Stimulating Hormone 0.649 (0.45-4.50 (International Unit)  ----------------------- Pregnant patients have  different reference  ranges for TSH:  - - - - - - - - - -  Pregnant, first trimetser:  0.36 - 2.50 uIU/mL)  LabObservation:  22-Aug-14 15:31   OBSERVATION Reason for Test  Hepatic:  20-Aug-14 04:00   Bilirubin, Direct  0.3 (Result(s) reported on 19 Dec 2012 at  08:23AM.)  22-Aug-14 04:47   Bilirubin, Total 0.4  Alkaline Phosphatase  219  SGPT (ALT) 32  SGOT (AST) 31  Total Protein, Serum  5.6  Albumin, Serum  1.6  TDMs:  17-Aug-14 20:34   Vancomycin, Trough LAB  7 (Result(s) reported on 16 Dec 2012 at 08:54PM.)  Routine Micro:  14-Aug-14 12:22   Organism Name GROUP F STREPTOCOCCUS  Organism Quantity MODERATE GROWTH  Organism 1 MODERATE GROWTH GROUP C STREPTOCOCCUS  Organism 2 MODERATE GROWTH GROUP F STREPTOCOCCUS   23-Aug-14 08:31   Gram Stain 1 MODERATE WHITE BLOOD CELLS  Gram Stain 2 FEW GRAM POSITIVE COCCI IN CLUSTERS  Gram Stain 3 MODERATE GRAM POSITIVE ROD  Result(s) reported on 24 Dec 2012 at 12:15PM.    16:20   Specimen Source LINE  Culture Comment NO GROWTH AEROBICALLY/ANAEROBICALLY IN 5 DAYS  Result(s) reported on 27 Dec 2012 at 04:00PM.  24-Aug-14 08:03   Micro Text Report CLOS.DIFF ASSAY, RT-PCR   COMMENT                   POSITIVE-CLOS.DIFFICILE TOXIN DETECTED BY PCR   ANTIBIOTIC                       General Ref:  14-Aug-14 12:23   ANA w/Reflex to 9 Conf.Tests ========== TEST NAME ==========  ========= RESULTS =========  = REFERENCE RANGE =  ANA W/REFLEX-9 CONF.TEST  ANA w/Reflex if Positive ANA Direct                      [   Negative             ]          Negative               [facility]            No: [id number]           [address], [place], [place] [address]           Lindon Romp, MD         [phone]   Result(s) reported on 14 Dec 2012 at 03:25PM.  ANCA Panel ========== TEST NAME ==========  ========= RESULTS =========  = REFERENCE RANGE =  ANCA PANEL  ANCA Panel Antimyeloperoxidase (MPO) Abs   [   <9.0 U/mL            ]           0.0-9.0 Antiproteinase 3 (PR-3) Abs     [   <3.5 U/mL            ]       0.0-3.5 Cytoplasmic (C-ANCA)            [   <1:20 titer          ]         Neg:<1:20 Perinuclear (P-ANCA)            [   <1:20 titer          ]         Neg:<1:20 The presence of positive fluorescence exhibiting P-ANCA or C-ANCA patterns alone is not specific for the diagnosis of Wegener's Granulomatosis (WG) or microscopic polyangiitis. Decisions about treatment should not be based solely on ANCA IFA results.  The International ANCA Group Consensus recommends follow up testing of positive sera with both PR-3 and MPO-ANCA enzyme immunoassays. As many as 5% serum  samples are positive only by EIA. Ref. AM J Clin Pathol  1999;111:507-513. Atypical pANCA                  [   <1:20 titer          ]         Neg:<1:20 The atypical pANCA pattern has been observed in a significant percentage of patients with ulcerative colitis, primary sclerosing cholangitis and autoimmune hepatitis.               [facility]            No: [id number]           [address], [place], [place] [address]           Lindon Romp, MD         [phone]   Result(s) reported on 14 Dec 2012 at 05:49PM.  Antiglomerular Base Membrane ========== TEST NAME ==========  ========= RESULTS =========  = REFERENCE RANGE =  ANTIGLOMERULAR BASE MEM.  Antiglomerular BM Ab Antiglomerular BM Ab            [   3 units              ]              0-20                                   Negative                   0 - 20                                   Weak Positive             21 - 30                                   Moderate to Strong Positive   >30               [facility]            No: [id number]           [address], [place], [place] [address]           Lindon Romp, MD         [phone]   Result(s) reported on 14 Dec 2012 at 05:49PM.  Complement C3, Serum ========== TEST NAME ==========  ========= RESULTS =========  = REFERENCE RANGE =  COMPLEMENT C3, SERUM  Complement C3, Serum Complement C3, Serum            [L  85 mg/dL Adult       ]            90-180               [facility]    No: [id number]           [address], [place], [place] [address]           Lindon Romp, MD         [phone]   Result(s) reported on 14 Dec 2012 at 05:49PM.  Complement C4 ========== TEST NAME ==========  ========= RESULTS =========  =  REFERENCE RANGE =  COMPLEMENT C4  Complement C4, Serum Complement C4, Serum            [   21 mg/dL Adult       ]              [address]            No: [id number]           [address] [place], [place] [address]            Lindon Romp, MD         [phone]   Result(s) reported on 14 Dec 2012 at 05:49PM.  HBsAg ========== TEST NAME ==========  ========= RESULTS =========  = REFERENCE RANGE =  HEPATITIS B SURFACE AG  HBsAg Screen HBsAg Screen                    [   Negative             ]          Negative               [facility]            No: [id number]           [address], [place], [place] [address]           Lindon Romp, MD         [phone]   Result(s) reported on 14 Dec 2012 at 03:47AM.  Hepatitis C Virus Antibody ========== TEST NAME ==========  ========= RESULTS =========  = REFERENCE RANGE =  HCV ANTIBODY  HCV Antibody Hep C Virus Ab                  [   <0.1 s/co ratio      ]           0.0-0.9                                                  Negative:     < 0.8                                              Indeterminate 0.8 - 0.9                                                  Positive:     > 0.9                                                                      .                  In order to reduce the  incidence of a false positive                  result, the CDC recommends that all s/co ratios                  between 1.0 and 10.9 be confirmed by a more specific                  supplemental or PCR testing. [facility] offers HCV Ab                 w/Reflex to Verification test [phone].               [facility]            No: [id number]           [address], [place], [place] [address]           Lindon Romp, MD         [phone]   Result(s) reportedon 14 Dec 2012 at 05:49PM.  Myoglobin, Urine ========== TEST NAME ==========  ========= RESULTS =========  = REFERENCE RANGE =  MYOGLOBIN,URINE  Myoglobin, Urine Myoglobin, Urine                [   12 ng/mL             ]              [address]               [facility]            No: [id number]           [address], [place], [place] [address]           Lindon Romp, MD         [phone]   Result(s) reported on 14 Dec 2012 at 03:49PM.  20-Aug-14 09:45   Haptoglobin ========== TEST NAME ==========  ========= RESULTS =========  = REFERENCE RANGE =  HAPTOGLOBIN  Haptoglobin Haptoglobin                     [H  311 mg/dL            ]            [address]               [facility]            No: [id number]           [address], [place], [place] [address]           Lindon Romp, MD         [phone]   Result(s) reported on 20 Dec 2012 at 06:49AM.  Routine Chem:  14-Aug-14 03:53   Ethanol, S. < 3  Ethanol % (comp) < 0.003 (Result(s) reported on 13 Dec 2012 at 05:22AM.)  27-CWC-37 62:83   Folic Acid, Serum 4.9 (Result(s) reported on 19 Dec 2012 at 10:44AM.)  Iron Binding Capacity (TIBC)  136  Unbound Iron Binding Capacity 113  Iron, Serum  23  Iron Saturation 17 (Result(s) reported on 19 Dec 2012 at 10:30AM.)  Ferritin [facility])  431 (Result(s) reported on 19 Dec 2012 at 10:44AM.)  LDH, Serum 191 (Result(s) reported on 19 Dec 2012 at 10:23AM.)  22-Aug-14 04:47   B-Type Natriuretic Peptide [facility])  404  25-Aug-14 08:04   Triglycerides, Serum  262 (Result(s) reported on  24 Dec 2012 at 08:41AM.)  27-Aug-14 04:08   Magnesium, Serum 2.1 (1.8-2.4 THERAPEUTIC RANGE: 4-7 mg/dL TOXIC: > 10 mg/dL  -----------------------)  Phosphorus, Serum 2.6 (Result(s) reported on 26 Dec 2012 at 04:57AM.)  Result Comment PLATELET - SLIGHT PLATELET CLUMPING IN SPECIMEN. ACTUAL  - NUMERICAL COUNT MAY BE SOMEWHAT HIGHER THAN  - THE REPORTED VALUE.  Result(s) reported on 26 Dec 2012 at 05:18AM.  28-Aug-14 06:05   Glucose, Serum  100  BUN 13  Creatinine (comp)  0.56  Sodium, Serum 140  Potassium, Serum  3.4  Chloride, Serum  110  CO2, Serum 21  Calcium (Total), Serum  8.4  Anion Gap 9  Osmolality (calc) 280  eGFR (African American) >60  eGFR (Non-African American) >60 (eGFR values <74m/min/1.73 m2 may be an indication of  chronic kidney disease (CKD). Calculated eGFR is useful in patients with stable renal function. The eGFR calculation will not be reliable in acutely ill patients when serum creatinine is changing rapidly. It is not useful in  patients on dialysis. The eGFR calculation may not be applicable to patients at the low and high extremes of body sizes, pregnant women, and vegetarians.)  Misc Urine Chem:  14-Aug-14 12:23   Creatinine, Urine  319.9  Protein, Random Urine  69  Protein/Creat Ratio (comp)  216  Urine Drugs:  [id number]  Tricyclic Antidepressant, Ur Qual (comp) NEGATIVE (Result(s) reported on 13 Dec 2012 at 06:17AM.)  Amphetamines, Urine Qual. NEGATIVE  MDMA, Urine Qual. NEGATIVE  Cocaine Metabolite, Urine Qual. POSITIVE  Opiate, Urine qual NEGATIVE  Phencyclidine, Urine Qual. NEGATIVE  Cannabinoid, Urine Qual. NEGATIVE  Barbiturates, Urine Qual. NEGATIVE  Benzodiazepine, Urine Qual. POSITIVE (----------------- The URINE DRUG SCREEN provides only a preliminary, unconfirmed analytical test result and should not be used for non-medical  purposes.  Clinical consideration and professional judgment should be  applied to any positive drug screen result due to possible interfering substances.  A more specific alternate chemical method must be used in order to obtain a confirmed analytical result.  Gas chromatography/mass spectrometry (GC/MS) is the preferred confirmatory method.)  Methadone, Urine Qual. POSITIVE  Cardiac:  14-Aug-14 10:39   CPK-MB, Serum  37.4 (Result(s) reported on 13 Dec 2012 at 01:17PM.)    18:36   Troponin I  2.70 (0.00-0.05 0.05 ng/mL or less: NEGATIVE  Repeat testing in 3-6 hrs  if clinically indicated. >0.05 ng/mL: POTENTIAL  MYOCARDIAL INJURY. Repeat  testing in 3-6 hrs if  clinically indicated. NOTE: An increase or decrease  of 30% or more on serial  testing suggests a  clinically important change)  16-Aug-14 04:02   CK, Total  1191  (Result(s) reported on 15 Dec 2012 at 09:54AM.)  Routine UA:  14-Aug-14 05:49   Color (UA) Yellow  Clarity (UA) Clear  Glucose (UA) Negative  Bilirubin (UA) Negative  Ketones (UA) 1+  Specific Gravity (UA) 1.025  Blood (UA) 3+  pH (UA) 5.0  Protein (UA) 25 mg/dL  Nitrite (UA) Negative  Leukocyte Esterase (UA) Negative (Result(s) reported on 13 Dec 2012 at 06:18AM.)  RBC (UA) 9 /HPF  WBC (UA) 2 /HPF  Bacteria (UA) TRACE  Epithelial Cells (UA) 1 /HPF  Mucous (UA) PRESENT  Granular Cast (UA) 9 /LPF (Result(s) reported on 13 Dec 2012 at 06:18AM.)  Routine Sero:  18-Aug-14 03:45   Rapid HIV 1/2 Ab Test with Confirmation ([facility]) NEG - HIV 1/2 AB This is a screening test for the presence of HIV-1/2 antibodies. False-negative and false-positive results  can occur. All preliminary positive samples are sent for confirmatory testing. Clinical correlation is necessary to assess whether repeat testing may be needed for negative results.  24-Aug-14 08:03   Occult Blood, Feces NEGATIVE (Result(s) reported on 23 Dec 2012 at 02:11PM.)  Routine Coag:  16-Aug-14 04:02   Activated PTT (APTT)  38.3 (A HCT value >55% may artifactually increase the APTT. In one study, the increase was an average of 19%. Reference: "Effect on Routine and Special Coagulation Testing Values of Citrate Anticoagulant Adjustment in Patients with High HCT Values." American Journal of Clinical Pathology 2006;126:400-405.)  Routine Hem:  26-Aug-14 04:19   Bands 1  Diff Comment 3 POLYCHROMASIA  Diff Comment 4 MICROCYTES PRESENT  Diff Comment 5 PLTS VARIED IN SIZE  Result(s) reported on 25 Dec 2012 at 05:28AM.  27-Aug-14 04:08   Neutrophil % 78.1  Lymphocyte % 14.1  Monocyte % 6.4  Eosinophil % 0.8  Basophil % 0.6  Neutrophil #  15.0  Lymphocyte # 2.7  Monocyte #  1.2  Eosinophil # 0.1  Basophil # 0.1  28-Aug-14 06:05   WBC (CBC)  16.4  RBC (CBC)  3.30  Hemoglobin (CBC)  9.6  Hematocrit (CBC)  27.5  Platelet  Count (CBC)  750 (Result(s) reported on 27 Dec 2012 at 07:53AM.)  MCV 83  MCH 29.1  MCHC 35.0  RDW 13.1  Segmented Neutrophils 70  Lymphocytes 17  Monocytes 7  Eosinophil 1  Metamyelocyte 3  Myelocyte 2  Diff Comment 1 ANISOCYTOSIS  Diff Comment 2 PLTS VARIED IN SIZE  Result(s) reported on 27 Dec 2012 at 07:53AM.   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  There is concern that this could be epileptic in origin as well. Mild R sided weakness-  this is most likely secondary to 1. needs EEG at some time start clonazepam 0.65m q8h for seizure and myoclonus control ok to use risperdone PRN for agitation may benefit from neuro-stimulation at some time after EEG recommend rehab placement with cognitive therapy please call on-call neurology with questions  Electronic Signatures: SJamison Neighbor(MD)  (Signed 29-Aug-14 14:56)  Authored: REFERRING PHYSICIAN, Primary Care Physician, Consult, History of Present Illness, Review of Systems, PAST MEDICAL/SURGICAL HISTORY, HOME MEDICATIONS, ALLERGIES, Social/Family History, NURSING VITAL SIGNS, Physical Exam-, LAB RESULTS, RADIOLOGY RESULTS, Recommendations   Last Updated: 29-Aug-14 14:56 by SJamison Neighbor(MD)

## 2014-08-22 NOTE — Discharge Summary (Signed)
PATIENT NAME:  Philip Owen, Philip Owen MR#:  [id number] DATE OF BIRTH:  1976/04/04  DATE OF ADMISSION:  12/13/2012 DATE OF DISCHARGE:  01/04/2013  FINAL DISCHARGE SUMMARY  I saw the patient only on 01/04/2013. Interim discharge summary dictated by Dr. Luberta MutterKonidena on 01/01/2013.  FINAL DIAGNOSES:  1.  Acute hypoxic respiratory failure secondary to aspiration pneumonia, resolved.  2.  Anoxic encephalopathy, improving.  3.  Anxiety/delirium, resolved. The patient is off Restoril.  4.  C. difficile colitis on vancomycin, the last dose September 8.  5.  Substance abuse with cocaine.  6.  Acute rhabdomyolysis.  7.  Generalized weakness with bilateral lower extremity weakness.  8.  Right upper extremity deep vein thrombosis on Xarelto.   CODE STATUS:  FULL CODE.   MEDICATIONS:  1.  Protonix 40 mg daily.  2.  Xanax 1 mg 3 times a day.  3.  Clonidine 0.1 mg b.i.d.  4.  Metoprolol 50 mg b.i.d.  5.  Amlodipine 5 mg daily.  6.  Clonazepam 0.5 mg daily.  7.  Xarelto 15 mg b.i.d. The last dose September 15.  8.  Xarelto 20 mg p.o. daily start from 16th of September.  9.  Vancomycin 250 mg 2 capsules every 8 hours for 3 days.  10.  Low-sodium, mechanical soft diet.   FOLLOWUP:  With Dr. Lacie ScottsNiemeyer in 2 to 4 weeks.   Physical therapy at acute inpatient rehab.   HOSPITAL COURSE:  1.  Consultations: As outlined by Dr. Luberta MutterKonidena.  2.  Overall the patient has shown a remarkable improvement regarding his cognition. He is tolerating PT. He is tolerating diet well. The patient does not have any more diarrhea, however, will complete the course with vancomycin as suggested by Infectious Disease. He is also on Zoloft for his right upper extremity DVT. Hospital stay was prolonged; however, stable at this time. The patient is also recommended to follow up with Neurology once discharged from rehab.  ____________________________ Jearl KlinefelterSona A. Allena KatzPatel, MD sap:jm D: 01/04/2013 09:38:21 ET T: 01/04/2013 09:57:00  ET JOB#: [id number]  cc: Melvenia Favela A. Allena KatzPatel, MD, <Dictator> Meindert A. Lacie ScottsNiemeyer, MD Willow OraSONA A Kamiya Acord MD ELECTRONICALLY SIGNED 01/09/2013 12:13 Jearl KlinefelterSONA A Donyae Kilner MD ELECTRONICALLY SIGNED 01/09/2013 12:13

## 2014-08-22 NOTE — Consult Note (Signed)
PATIENT NAME:  Nicole KindredBUNTING, Jodi S MR#:  [id number] DATE OF BIRTH:  [birth year]  DATE OF CONSULTATION:  12/18/2012  REFERRING PHYSICIAN:  Srikar R. Sudini, MD CONSULTING PHYSICIAN:  Stann Mainlandavid P. Sampson GoonFitzgerald, MD  REASON FOR CONSULTATION: Fever, rash and aspiration pneumonia.   HISTORY OF PRESENT ILLNESS: This is an unfortunate 39 year old gentleman who was admitted August 14 after being found unresponsive in a large amount of vomitus. He was found to be in marked respiratory distress. His tox screen was positive for cocaine, methadone and benzodiazepines. He was intubated and has been in the ICU for the last 6 days. He has been hemodynamically relatively stable, although is having difficulty with ventilation. He has had fevers off and on since admission as well as an elevated white count. He has been covered with antibiotics for aspiration pneumonia. We are consulted for further antibiotic management.   He has been noted to develop a rash on his back, abdomen, arms and legs over the last 1 to 2 days. He has had sputum cultures growing group C strep and group F strep. He continues to make some sputum on trach aspirate. His course has also been complicated by acute renal failure, which is now improving with creatinine back to baseline. His mental status remains quite poor.   PAST MEDICAL HISTORY: 1.  Anxiety.  2.  GERD.  3.  OSA.  4.  Hyperlipidemia.  5.  Osteoarthritis.   PAST SURGICAL HISTORY: None.   ALLERGIES: NOVOCAINE, NAPROXEN AND ULTRAM. I DISCUSSED WITH THE PATIENT'S PARENTS AND THEY DO NOT REMEMBER ANY ALLERGIES TO ANTIBIOTICS AS A CHILD.   FAMILY HISTORY: Positive for cancer in his mother, otherwise negative.   SOCIAL HISTORY: The patient is separated from his wife. He drinks off and on per his wife. He does continue to smoke. His urine tox is positive for cocaine, benzos and methadone, although his wife had stated she did not know he used drugs. He is currently unemployed. He does not per  the family do any unusual activities such as hiking, hunting, or have any contact with animals. He has no pets at home.   MEDICATIONS: As an outpatient included only Xanax and Protonix. Antibiotics since admission include levofloxacin begun August 15 and continued currently; Zosyn begun on admission August 13 and continued currently; vancomycin, received a dose on the 13th, again on the 15th and the 17th and today. Other medications include Lovenox, aspirin, propofol drip, fentanyl drip, Tylenol, DuoNeb, diltiazem, Colace, Haldol, Ativan, nitroglycerin ointment, lactulose, scopolamine, sliding scale insulin.   REVIEW OF SYSTEMS: Unable to be obtained.   PHYSICAL EXAMINATION: VITAL SIGNS: T-max over the last 24 hours is 101.3. He has had intermittent daily fevers over the last several days, but no high spiking fevers. Back on the 16th, his temperature had been elevated similar to 101.3. Current pulse rate is 80, blood pressure 90/52, respirations 20. He is ventilated 99%.  GENERAL: He is sedated, lying in bed, eyes open with the ventilator on. He has a Foley catheter in place.  HEENT: His pupils are equal, round, reactive to light. Conjunctivae are anicteric and not pale. ET tube is in his oropharynx.  HEART: Regular with no murmurs.  LUNGS: Coarse breath sounds bilaterally.  ABDOMEN: Soft, nontender, nondistended. No hepatosplenomegaly.  EXTREMITIES: There is 2+ edema, bilateral upper extremity and lower extremity.  SKIN: He has a diffuse morbilliform rash on his arms, legs, trunk and back.  GENITOURINARY: He has a Foley catheter in place.   LABORATORY, DIAGNOSTIC AND  RADIOLOGICAL DATA: Blood cultures x 2 from August 18 are negative. Sputum culture from August 14 is growing group C strep and group F strep. Urinalysis done on August 14 is negative. HIV rapid test was negative. White blood count day of admission was 12.1; today it is 0.7, hemoglobin 10.0, platelets of 200. He has 6% eosinophils, 66%  neutrophils, 17% lymphocytes. Creatinine currently is 1.08. Other electrolytes are normal. Last LFT check on August 14 was normal, except an AST of 90. Troponin was positive at 0.19. CT scan of the chest done August 17 showed extensive areas of air space disease bilaterally with complete consolidation in the lower lobes and significant consolidation in the upper lobes. CT of the head August 14 showed near effacement of the sulci. There was no evidence of acute ischemic or hemorrhagic infarction. Ultrasound of the right upper extremity does show DVT. Chest x-ray August 18 shows improving infiltrates both lungs, interim resolution of pleural effusions.   IMPRESSION: Unfortunate 39 year old gentleman who appears to have had a drug overdose leading to vomiting and unconsciousness and an aspiration pneumonia. His lung CAT scan was very impressive when he was admitted. He is currently requiring continued vent support. He is hemodynamically stable, has a mildly elevated white count, but has persistent low to moderate-grade fevers. He also has a new rash consistent with a drug rash.   It is most likely that this fever is just chronic critical illness with his severe pulmonary findings. The aspiration pneumonia is more likely chemical pneumonitis. However, there is high risk of infection and I would recommend continuing him on antibiotics for this. At this point, however, he has what probably is a drug rash, likely either from the Zosyn or the vancomycin. Other possible sources of infection causing fevers would of course be the deep vein thrombosis, ventilator-associated pneumonia, urinary tract infection with his Foley and hepatitis.   RECOMMENDATIONS: 1.  At this point, I would recommend stopping the Zosyn and vancomycin. We have not isolated any persistent organisms. I WOULD LIKELY CONSIDER HIM ALLERGIC TO PENICILLINS IN THE FUTURE. For coverage, I would recommend that we switch him to   levofloxacin and  clindamycin for better anaerobic coverage. If he worsens, we could change that and consolidate it to meropenem.  2.  Also recommend rechecking LFTs and we will add those on to the labs this morning.   Thank you for the consult. I will be glad to follow with you.   ____________________________ Stann Mainland. Sampson Goon, MD dpf:jm D: 12/18/2012 12:56:51 ET T: 12/18/2012 13:28:40 ET JOB#: [id number]  cc: Stann Mainland. Sampson Goon, MD, <Dictator> Ronnica Dreese Sampson Goon MD ELECTRONICALLY SIGNED 12/21/2012 19:21

## 2014-08-22 NOTE — Consult Note (Signed)
Brief Consult Note: Diagnosis: Multifactorial delirium.   Patient was seen by consultant.   Recommend further assessment or treatment.   Orders entered.   Comments: The patient refused to see me. He agreed to try again on Sunday. Dr. Katrinka BlazingSmith is yet to see the patient. Per family, the patient confused and hallucinating at times.   PLAN: 1. Will start low dose risperdal for delirium if Dr. Katrinka BlazingSmith agrees.  2. Will follow on Sunday.  Electronic Signatures: Kristine LineaPucilowska, Gabrien Mentink (MD)  (Signed 29-Aug-14 13:42)  Authored: Brief Consult Note   Last Updated: 29-Aug-14 13:42 by Kristine LineaPucilowska, Adebayo Ensminger (MD)

## 2014-08-22 NOTE — Consult Note (Signed)
General Aspect 39 year old gentleman with history of obstructive sleep apnea, not on CPAP, anxiety, GERD, and hyperlipidemia, history of GI issues, who presents with respiratory distress, LOC. Cardiology was consulted for  elevated cardiac enz.Tox screen positive for cocaine, methadone, among others.  He slept for >30 hours, then at 3:00 a.m. in the morning, he started gasping for air, and he was not able to be aroused. The wife called EMS. EMS told her to start CPR. The patient was given CPR by the wife, but whenever EMS arrived, he had a pulse, but he was unresponsive.   In teh ER, CT scan of the head was done.  He was acidotic with respiratory acidosis and a potassium of 7.7, acute kidney injury with a creatinine of 3.5. The patient was given insulin, D50, Kayexalate, boluses of NS and also calcium chloride. He was started on Zosyn and vancomycin for aspiration pneumonia. he is sedated on propofol. The patient is positive for benzodiazepines, methadone and cocaine.   Present Illness . SURGICAL HISTORY: None.   FAMILY HISTORY: Negative for MI. Positive for cancer in his mom of unknown source. No other family history.   Physical Exam:  GEN well developed   HEENT sedated   NECK supple   RESP intubated   CARD Regular rate and rhythm  No murmur   ABD soft  normal BS   LYMPH negative neck   EXTR negative edema   SKIN normal to palpation   NEURO unable to test   [facility] sedated   Review of Systems:  ROS Pt not able to provide ROS     Hyperlipidemia:    HTN:    Questionable sleep apnea:    Panic Attacks:    anxiety:    GERD - Esophageal Reflux:    Denies surgical history.:        Admit Diagnosis:   UNRESPONSIVE: Onset Date: 13-Dec-2012, Status: Active, Description: UNRESPONSIVE  Home Medications: Medication Instructions Status  Protonix 40 mg oral delayed release tablet 1 tab(s) orally once a day Active  Xanax 1 mg oral tablet 1 tab(s) orally 3 times a day  Active   Lab Results:  Thyroid:  15-Aug-14 04:15   Thyroid Stimulating Hormone 0.649 (0.45-4.50 (International Unit)  ----------------------- Pregnant patients have  different reference  ranges for TSH:  - - - - - - - - - -  Pregnant, first trimetser:  0.36 - 2.50 uIU/mL)  Routine Chem:  15-Aug-14 04:15   Glucose, Serum  104  BUN  27  Creatinine (comp)  1.67  Sodium, Serum 142  Potassium, Serum 4.3  Chloride, Serum  110  CO2, Serum 27  Calcium (Total), Serum  7.3  Anion Gap  5  Osmolality (calc) 289  eGFR (African American) >60  eGFR (Non-African American)  53 (eGFR values <24m/min/1.73 m2 may be an indication of chronic kidney disease (CKD). Calculated eGFR is useful in patients with stable renal function. The eGFR calculation will not be reliable in acutely ill patients when serum creatinine is changing rapidly. It is not useful in  patients on dialysis. The eGFR calculation may not be applicable to patients at the low and high extremes of body sizes, pregnant women, and vegetarians.)  Magnesium, Serum 1.8 (1.8-2.4 THERAPEUTIC RANGE: 4-7 mg/dL TOXIC: > 10 mg/dL  -----------------------)  Routine Hem:  15-Aug-14 04:15   WBC (CBC)  14.1  RBC (CBC)  4.13  Hemoglobin (CBC)  12.3  Hematocrit (CBC)  35.5  Platelet Count (CBC) 180  MCV 86  MCH 29.7  MCHC 34.6  RDW 13.6  Neutrophil % 84.3  Lymphocyte % 11.1  Monocyte % 3.6  Eosinophil % 0.5  Basophil % 0.5  Neutrophil #  11.9  Lymphocyte # 1.6  Monocyte # 0.5  Eosinophil # 0.1  Basophil # 0.1 (Result(s) reported on 14 Dec 2012 at 04:52AM.)   EKG:  Interpretation EKG shows sinus tachycardia with no significant ST or T wave changes   Radiology Results: XRay:    14-Aug-14 04:09, Chest Portable Single View  Chest Portable Single View   REASON FOR EXAM:    post intubation  COMMENTS:       PROCEDURE: DXR - DXR PORTABLE CHEST SINGLE VIEW  - Dec 13 2012  4:09AM     RESULT: Comparison: None    Findings:      Single portable AP chest radiograph is provided. There is right lower   lobe consolidation. The left lung is clear. There is no pleural effusion.   There is an endotracheal tube 7 cm above the carina. Normal   cardiomediastinal silhouette. The osseous structures are unremarkable.    IMPRESSION:   Right lower lobe pneumonia.    Dictation Site: 1        Verified By: Jennette Banker, M.D., MD    15-Aug-14 06:04, Chest Portable Single View  Chest Portable Single View   REASON FOR EXAM:    aspiration  COMMENTS:       PROCEDURE: DXR - DXR PORTABLE CHEST SINGLE VIEW  - Dec 14 2012  6:04AM     RESULT: Comparison: 12/13/2012    Findings:     Single portable AP chest radiograph is provided. The support lines and   tubing are in satisfactory position. There is right lower lobe   consolidation consistent with pneumonia. There is left basilar airspace   disease which may reflect atelectasis versus developing pneumonia. There   is no pleural effusion or pneumothorax. Normal cardiomediastinal   silhouette. The osseous structures are unremarkable.  IMPRESSION:     Please see above.    Dictation Site: 1        Verified By: Jennette Banker, M.D., MD  Cardiology:    14-Aug-14 13:34, Echo Doppler  Echo Doppler   REASON FOR EXAM:      COMMENTS:       PROCEDURE: [facility] - ECHO DOPPLER COMPLETE(TRANSTHOR)  - Dec 13 2012  1:34PM     RESULT: Echocardiogram Report    Patient Name:   PESACH FRISCH Date of Exam: 12/13/2012  Medical Rec #:  [id number]                Custom1:  Date of Birth:  1975/10/26             Height:       72.0 in  Patient Age:    35 years              Weight:       185.0 lb  Patient Gender: M                     BSA:          2.06 m??    Indications: Respiratory Failure  Sonographer:    Sherrie Sport RDCS  Referring Phys: James Ivanoff, ROBERTO    Sonographer Comments: Technically very difficult study due to very poor   echo windows, no parasternal window, no  apical window, echo performed   with patient  supine and on artificial respiratorand The only view   obtainable was subcostal.    Summary:   1. Challenging image quality secondary to inability to position patient.   2. Left ventricular ejection fraction, by visual estimation, is grossly   45 to 50%.   3. Mildly decreased global left ventricular systolic function.   4. Moderately reduced RV systolic function.   5. Mild tricuspid regurgitation.   6. Normal RVSP.   7. If clinically indicated, consider repeat echocardiogram once able to   position patient.  2D AND M-MODE MEASUREMENTS (normal ranges within parentheses):  Left Ventricle:          Normal  IVSd (2D):      0.98 cm (0.7-1.1)  LVPWd (2D):     1.22 cm (0.7-1.1) Aorta/LA:                  Normal  LVIDd (2D):     3.65 cm (3.4-5.7) Left Atrium (2D): 4.10 cm (1.9-4.0)  LVIDs (2D):     3.21 cm  LV FS (2D):     12.1 %   (>25%)  LV EF (2D):     26.6 %   (>50%)   Right Ventricle:                                    RVd (2D):        3.29 cm  SPECTRAL DOPPLER ANALYSIS (where applicable):  Tricuspid Valve and PA/RV Systolic Pressure: TR Max Velocity: 1.57 m/s RA   Pressure: 10 mmHg RVSP/PASP: 19.9 mmHg  Pulmonic Valve:  PV Max Velocity: 1.09 m/s PV Max PG: 4.8 mmHg PV Mean PG:    PHYSICIAN INTERPRETATION:  Left Ventricle: The left ventricular internal cavity size was normal. LV   posterior wall thickness was normal. No left ventricular hypertrophy.   Global LV systolic function was mildly decreased. Left ventricular   ejection fraction, by visual estimation, is 45 to 50%.  Right Ventricle: The right ventricular size is normal. Global RV systolic   function is moderately reduced.  Left Atrium: The left atrium is normal in size.  Right Atrium: The right atrium is normal in size.  Pericardium: There is no evidence of pericardial effusion.  Mitral Valve: The mitral valve is normal in structure. Trace mitral valve   regurgitation is  seen.  Tricuspid Valve: The tricuspid valve is normal. Mild tricuspid     regurgitation is visualized. The tricuspid regurgitant velocity is 1.57   m/s, and with an assumed right atrial pressure of 10 mmHg, the estimated   right ventricular systolic pressure is normal at 19.9 mmHg.  Aortic Valve: The aortic valve is normal. No evidence of aortic valve   regurgitation is seen.  Pulmonic Valve: The pulmonic valve is normal. Trace pulmonic valve   regurgitation.  Aorta: The aortic root is normal in size and structure.    [id number] Ida Rogue MD  Electronically signed by [id number] Ida Rogue MD  Signature Date/Time: 12/14/2012/7:59:55 AM    *** Final ***  IMPRESSION: .        Verified By: Minna Merritts, M.D., MD    Naproxen: Itching  Nubain: Itching, Other  Ultram: Itching  Vital Signs/Nurse's Notes: **Vital Signs.:   15-Aug-14 06:00  Vital Signs Type Routine  Temperature Temperature (F) 100.3  Temperature Source rectal  Pulse Pulse 98  Respirations Respirations 20  Systolic BP Systolic BP 175  Diastolic BP (mmHg) Diastolic BP (mmHg) 54  Mean BP 72  Pulse Ox % Pulse Ox % 98  Oxygen Delivery Ventilator Assisted  Pulse Ox Heart Rate 98    Impression 39 year old gentleman with history of obstructive sleep apnea, not on CPAP, anxiety, GERD, and hyperlipidemia, history of GI issues, who presents with respiratory distress, LOC. Cardiology was consulted for  elevated cardiac enz. Tox screen positive for cocaine, methadone, among others.  1) Elevated cardiac enz: Secondary to respiratory distress, hypoxia, cocaine Echo showing moderately depressed RV function, mildly depressed LV function --Supportive care at this time, hold all b-blockers Ok to use Ca channel blockers for rate control. Noral RVSP on echo, ok to give fluids. Elevated troponin likely demand ischemia, not from primary ischemic event. O to hold heparin  2) Drug positive Likely contributing to mental status  change, respiratory distress, aspiration PNA  3) Aspiration PNA from LOC from drugs, CPR given on broad ABx intubated, on pressors for BP suport.   Electronic Signatures: Ida Rogue (MD)  (Signed 15-Aug-14 08:51)  Authored: General Aspect/Present Illness, History and Physical Exam, Review of System, Past Medical History, Health Issues, Home Medications, Labs, EKG , Radiology, Allergies, Vital Signs/Nurse's Notes, Impression/Plan   Last Updated: 15-Aug-14 08:51 by Ida Rogue (MD)

## 2014-08-24 ENCOUNTER — Emergency Department
Admit: 2014-08-24 | Disposition: A | Discharge status: Home / Self Care | Payer: Self-pay | Admitting: Physician Assistant

## 2014-12-25 ENCOUNTER — Emergency Department
Admission: EM | Admit: 2014-12-25 | Discharge: 2014-12-25 | Disposition: A | Discharge status: Home / Self Care | Payer: Medicaid Other | Attending: Emergency Medicine | Admitting: Emergency Medicine

## 2014-12-25 ENCOUNTER — Emergency Department: Payer: Medicaid Other

## 2014-12-25 ENCOUNTER — Encounter: Payer: Self-pay | Admitting: Emergency Medicine

## 2014-12-25 DIAGNOSIS — S92911A Unspecified fracture of right toe(s), initial encounter for closed fracture: Secondary | ICD-10-CM

## 2014-12-25 DIAGNOSIS — Y998 Other external cause status: Secondary | ICD-10-CM | POA: Insufficient documentation

## 2014-12-25 DIAGNOSIS — Y9289 Other specified places as the place of occurrence of the external cause: Secondary | ICD-10-CM | POA: Insufficient documentation

## 2014-12-25 DIAGNOSIS — Y9389 Activity, other specified: Secondary | ICD-10-CM | POA: Insufficient documentation

## 2014-12-25 DIAGNOSIS — S99921A Unspecified injury of right foot, initial encounter: Secondary | ICD-10-CM | POA: Diagnosis present

## 2014-12-25 DIAGNOSIS — Z720 Tobacco use: Secondary | ICD-10-CM | POA: Insufficient documentation

## 2014-12-25 DIAGNOSIS — S92414A Nondisplaced fracture of proximal phalanx of right great toe, initial encounter for closed fracture: Secondary | ICD-10-CM | POA: Insufficient documentation

## 2014-12-25 DIAGNOSIS — R000 Tachycardia, unspecified: Secondary | ICD-10-CM | POA: Diagnosis not present

## 2014-12-25 DIAGNOSIS — W2209XA Striking against other stationary object, initial encounter: Secondary | ICD-10-CM | POA: Insufficient documentation

## 2014-12-25 HISTORY — DX: Gastro-esophageal reflux disease without esophagitis: K21.9

## 2014-12-25 MED ORDER — OXYCODONE-ACETAMINOPHEN 5-325 MG PO TABS: 1.0000 | ORAL_TABLET | ORAL | Status: DC | PRN

## 2014-12-25 MED ORDER — OXYCODONE-ACETAMINOPHEN 5-325 MG PO TABS
2.0000 | ORAL_TABLET | Freq: Once | ORAL | Status: AC
  Administered 2014-12-25: 2 via ORAL
  Filled 2014-12-25: qty 2

## 2014-12-25 NOTE — Discharge Instructions (Signed)
Please keep your splint clean and dry.  Do not bear any weight on your injured foot and try to keep it is still as possible.  Use her crutches when moving around.  Keep your foot elevated when you are resting in you may also use ice packs as needed.  He can take over-the-counter pain medication as needed for mild to moderate pain.  Take Percocet as prescribed for severe pain. Do not drink alcohol, drive or participate in any other potentially dangerous activities while taking this medication as it may make you sleepy. Do not take this medication with any other sedating medications, either prescription or over-the-counter. If you were prescribed Percocet or Vicodin, do not take these with acetaminophen (Tylenol) as it is already contained within these medications.   This medication is an opiate (or narcotic) pain medication and can be habit forming.  Use it as little as possible to achieve adequate pain control.  Do not use or use it with extreme caution if you have a history of opiate abuse or dependence.  If you are on a pain contract with your primary care doctor or a pain specialist, be sure to let them know you were prescribed this medication today from the [facility] Emergency Department.  This medication is intended for your use only - do not give any to anyone else and keep it in a secure place where nobody else, especially children, have access to it.  It will also cause or worsen constipation, so you may want to consider taking an over-the-counter stool softener while you are taking this medication.  Follow up with Dr. Ether Griffins early next week.   Toe Fracture Your caregiver has diagnosed you as having a fractured toe. A toe fracture is a break in the bone of a toe. "Buddy taping" is a way of splinting your broken toe, by taping the broken toe to the toe next to it. This "buddy taping" will keep the injured toe from moving beyond normal range of motion. Buddy taping also helps the toe heal in  a more normal alignment. It may take 6 to 8 weeks for the toe injury to heal. HOME CARE INSTRUCTIONS   Leave your toes taped together for as long as directed by your caregiver or until you see a doctor for a follow-up examination. You can change the tape after bathing. Always use a small piece of gauze or cotton between the toes when taping them together. This will help the skin stay dry and prevent infection.  Apply ice to the injury for 15-20 minutes each hour while awake for the first 2 days. Put the ice in a plastic bag and place a towel between the bag of ice and your skin.  After the first 2 days, apply heat to the injured area. Use heat for the next 2 to 3 days. Place a heating pad on the foot or soak the foot in warm water as directed by your caregiver.  Keep your foot elevated as much as possible to lessen swelling.  Wear sturdy, supportive shoes. The shoes should not pinch the toes or fit tightly against the toes.  Your caregiver may prescribe a rigid shoe if your foot is very swollen.  Your may be given crutches if the pain is too great and it hurts too much to walk.  Only take over-the-counter or prescription medicines for pain, discomfort, or fever as directed by your caregiver.  If your caregiver has given you a follow-up appointment, it is  very important to keep that appointment. Not keeping the appointment could result in a chronic or permanent injury, pain, and disability. If there is any problem keeping the appointment, you must call back to this facility for assistance. SEEK MEDICAL CARE IF:   You have increased pain or swelling, not relieved with medications.  The pain does not get better after 1 week.  Your injured toe is cold when the others are warm. SEEK IMMEDIATE MEDICAL CARE IF:   The toe becomes cold, numb, or white.  The toe becomes hot (inflamed) and red. Document Released: 04/15/2000 Document Revised: 07/11/2011 Document Reviewed: 12/03/2007 [facility]  Patient Information 2015 [place], [place]. This information is not intended to replace advice given to you by your health care provider. Make sure you discuss any questions you have with your health care provider.  Cast or Splint Care Casts and splints support injured limbs and keep bones from moving while they heal. It is important to care for your cast or splint at home.  HOME CARE INSTRUCTIONS  Keep the cast or splint uncovered during the drying period. It can take 24 to 48 hours to dry if it is made of plaster. A fiberglass cast will dry in less than 1 hour.  Do not rest the cast on anything harder than a pillow for the first 24 hours.  Do not put weight on your injured limb or apply pressure to the cast until your health care provider gives you permission.  Keep the cast or splint dry. Wet casts or splints can lose their shape and may not support the limb as well. A wet cast that has lost its shape can also create harmful pressure on your skin when it dries. Also, wet skin can become infected.  Cover the cast or splint with a plastic bag when bathing or when out in the rain or snow. If the cast is on the trunk of the body, take sponge baths until the cast is removed.  If your cast does become wet, dry it with a towel or a blow dryer on the cool setting only.  Keep your cast or splint clean. Soiled casts may be wiped with a moistened cloth.  Do not place any hard or soft foreign objects under your cast or splint, such as cotton, toilet paper, lotion, or powder.  Do not try to scratch the skin under the cast with any object. The object could get stuck inside the cast. Also, scratching could lead to an infection. If itching is a problem, use a blow dryer on a cool setting to relieve discomfort.  Do not trim or cut your cast or remove padding from inside of it.  Exercise all joints next to the injury that are not immobilized by the cast or splint. For example, if you have a long leg cast,  exercise the hip joint and toes. If you have an arm cast or splint, exercise the shoulder, elbow, thumb, and fingers.  Elevate your injured arm or leg on 1 or 2 pillows for the first 1 to 3 days to decrease swelling and pain.It is best if you can comfortably elevate your cast so it is higher than your heart. SEEK MEDICAL CARE IF:   Your cast or splint cracks.  Your cast or splint is too tight or too loose.  You have unbearable itching inside the cast.  Your cast becomes wet or develops a soft spot or area.  You have a bad smell coming from inside your cast.  You get an object stuck under your cast.  Your skin around the cast becomes red or raw.  You have new pain or worsening pain after the cast has been applied. SEEK IMMEDIATE MEDICAL CARE IF:   You have fluid leaking through the cast.  You are unable to move your fingers or toes.  You have discolored (blue or white), cool, painful, or very swollen fingers or toes beyond the cast.  You have tingling or numbness around the injured area.  You have severe pain or pressure under the cast.  You have any difficulty with your breathing or have shortness of breath.  You have chest pain. Document Released: 04/15/2000 Document Revised: 02/06/2013 Document Reviewed: 10/25/2012 [facility] Patient Information 2015 [place], [place]. This information is not intended to replace advice given to you by your health care provider. Make sure you discuss any questions you have with your health care provider.

## 2014-12-25 NOTE — ED Notes (Signed)
Patient to ER for c/o pain to right great toe and right 2nd toe after kicking metal bed frame.

## 2014-12-25 NOTE — ED Provider Notes (Addendum)
[facility] Emergency Department Provider Note  ____________________________________________  Time seen: Approximately 7:10 PM  I have reviewed the triage vital signs and the nursing notes.   HISTORY  Chief Complaint Toe Injury    HPI Philip Owen is a 39 y.o. male with no significant past medical history who presents with pain in his right big toe and right second toe after kicking a metal bed frame by accident while moving some furniture.  The pain onset was acute, sudden, and severe.  He did not hear a snapping or popping sound.  He is able to move the toes but they are painful.  He did not receive any other injuries.  He has not had any other similar injuries in the past.   Past Medical History  Diagnosis Date  . Acid reflux     There are no active problems to display for this patient.   History reviewed. No pertinent past surgical history.  Current Outpatient Rx  Name  Route  Sig  Dispense  Refill  . oxyCODONE-acetaminophen (ROXICET) 5-325 MG per tablet   Oral   Take 1-2 tablets by mouth every 4 (four) hours as needed for severe pain.   20 tablet   0     Allergies Tramadol  No family history on file.  Social History Social History  Substance Use Topics  . Smoking status: Current Every Day Smoker -- 0.50 packs/day  . Smokeless tobacco: None  . Alcohol Use: No    Review of Systems Constitutional: No fever/chills Cardiovascular: Denies chest pain. Respiratory: Denies shortness of breath. Gastrointestinal: No abdominal pain.  No nausea, no vomiting.   Musculoskeletal: Negative for back pain.  Severe pain in right first and second toe.  ____________________________________________   PHYSICAL EXAM:  VITAL SIGNS: ED Triage Vitals  Enc Vitals Group     BP 12/25/14 1750 139/84 mmHg     Pulse Rate 12/25/14 1750 114     Resp 12/25/14 1750 18     Temp 12/25/14 1750 98.1 F (36.7 C)     Temp Source 12/25/14 1750 Oral      SpO2 12/25/14 1750 97 %     Weight 12/25/14 1750 154 lb 8 oz (70.081 kg)     Height 12/25/14 1750  (1.727 m)     Head Cir --      Peak Flow --      Pain Score 12/25/14 1750 8     Pain Loc --      Pain Edu? --      Excl. in GC? --     Constitutional: Alert and oriented. Well appearing and in no acute distress.  Does appear to be in pain Eyes: Conjunctivae are normal. PERRL. EOMI. Head: Atraumatic. Cardiovascular: Slight tachycardia, regular rhythm. Grossly normal heart sounds.  Good peripheral circulation. Respiratory: Normal respiratory effort.  No retractions. Lungs CTAB. Musculoskeletal: Mild swelling and tenderness to palpation of the right great toe and second toe.  Normal movement, neurovascularly intact.  Slight erythema.  No gross deformities.  No open wounds. Skin:  Skin is warm, dry and intact. No rash noted. Psychiatric: Mood and affect are normal. Speech and behavior are normal.  ____________________________________________   LABS (all labs ordered are listed, but only abnormal results are displayed)  Labs Reviewed - No data to display ____________________________________________  EKG  Not indicated ____________________________________________  RADIOLOGY I, Yamira Papa, personally viewed and evaluated these images (plain radiographs) as part of my medical decision making.  Of note, on my personal interpretation of the film, I am concerned about the comminuted nature of the fracture and what I feel is a slight disruption of the cortex on the articular surface.  Dg Foot Complete Right  12/25/2014   CLINICAL DATA:  Right great toe and second toe pain after kicking a metal bed frame.  EXAM: RIGHT FOOT COMPLETE - 3+ VIEW  COMPARISON:  None.  FINDINGS: Nondisplaced fracture of the first proximal phalanx. No intra-articular extension or angulation. Associated soft tissue swelling. No additional fractures seen.  IMPRESSION: Nondisplaced first proximal phalanx fracture.    Electronically Signed   By: Beckie Salts M.D.   On: 12/25/2014 18:23     ____________________________________________   PROCEDURES  Procedure(s) performed: splint, see procedure note(s).   SPLINT APPLICATION Date/Time: 11:09 PM Authorized by: Loleta Rose Consent: Verbal consent obtained. Risks and benefits: risks, benefits and alternatives were discussed Consent given by: patient Splint applied by: ED technician Location details: RLE Splint type: posterior short leg Supplies used: ortho-glass Post-procedure: The splinted body part was neurovascularly unchanged following the procedure. Patient tolerance: Patient tolerated the procedure well with no immediate complications.   Critical Care performed: No ____________________________________________   INITIAL IMPRESSION / ASSESSMENT AND PLAN / ED COURSE  Pertinent labs & imaging results that were available during my care of the patient were reviewed by me and considered in my medical decision making (see chart for details).  Discussed by phone with Dr. Ether Griffins who recommended a posterior short leg splint extending beyond the toes and he will see the patient in follow-up in clinic early next week.  I discussed this with the patient who understands and agrees the importance of nonweightbearing and not moving the toe.`  ____________________________________________  FINAL CLINICAL IMPRESSION(S) / ED DIAGNOSES  Final diagnoses:  Fracture of toe of right foot, closed, initial encounter      NEW MEDICATIONS STARTED DURING THIS VISIT:  New Prescriptions   OXYCODONE-ACETAMINOPHEN (ROXICET) 5-325 MG PER TABLET    Take 1-2 tablets by mouth every 4 (four) hours as needed for severe pain.     Loleta Rose, MD 12/25/14 2022  Loleta Rose, MD 12/25/14 [phone]

## 2015-03-09 ENCOUNTER — Emergency Department
Admission: EM | Admit: 2015-03-09 | Discharge: 2015-03-09 | Disposition: A | Discharge status: Home / Self Care | Payer: Medicaid Other | Attending: Emergency Medicine | Admitting: Emergency Medicine

## 2015-03-09 ENCOUNTER — Emergency Department: Payer: Medicaid Other

## 2015-03-09 DIAGNOSIS — R0602 Shortness of breath: Secondary | ICD-10-CM | POA: Diagnosis not present

## 2015-03-09 DIAGNOSIS — Z720 Tobacco use: Secondary | ICD-10-CM | POA: Diagnosis not present

## 2015-03-09 DIAGNOSIS — R079 Chest pain, unspecified: Secondary | ICD-10-CM | POA: Diagnosis not present

## 2015-03-09 DIAGNOSIS — R0981 Nasal congestion: Secondary | ICD-10-CM | POA: Insufficient documentation

## 2015-03-09 DIAGNOSIS — F419 Anxiety disorder, unspecified: Secondary | ICD-10-CM | POA: Insufficient documentation

## 2015-03-09 HISTORY — DX: Anxiety disorder, unspecified: F41.9

## 2015-03-09 LAB — BASIC METABOLIC PANEL
ANION GAP: 8 (ref 5–15)
BUN: 17 mg/dL (ref 6–20)
CALCIUM: 9 mg/dL (ref 8.9–10.3)
CO2: 25 mmol/L (ref 22–32)
CREATININE: 1.04 mg/dL (ref 0.61–1.24)
Chloride: 107 mmol/L (ref 101–111)
GFR calc Af Amer: >60 mL/min (ref >60)
GFR calc non Af Amer: >60 mL/min (ref >60)
GLUCOSE: 85 mg/dL (ref 65–99)
Potassium: 3.5 mmol/L (ref 3.5–5.1)
Sodium: 140 mmol/L (ref 135–145)

## 2015-03-09 LAB — TROPONIN I
Troponin I: <0.03 ng/mL (ref <0.031)
Troponin I: <0.03 ng/mL (ref <0.031)

## 2015-03-09 LAB — CBC
HEMATOCRIT: 40.8 % (ref 40.0–52.0)
Hemoglobin: 14 g/dL (ref 13.0–18.0)
MCH: 29.5 pg (ref 26.0–34.0)
MCHC: 34.4 g/dL (ref 32.0–36.0)
MCV: 85.8 fL (ref 80.0–100.0)
Platelets: 284 10*3/uL (ref 150–440)
RBC: 4.76 MIL/uL (ref 4.40–5.90)
RDW: 12.9 % (ref 11.5–14.5)
WBC: 8.2 10*3/uL (ref 3.8–10.6)

## 2015-03-09 NOTE — ED Provider Notes (Signed)
[facility] Emergency Department Provider Note ____________________________________________  Time seen: Approximately 208 AM  I have reviewed the triage vital signs and the nursing notes.   HISTORY  Chief Complaint Chest Pain    HPI Philip Owen is a 39 y.o. male who comes into the hospital today with shortness of breath and chest pain. The patient reports it started a few hours ago. He was sitting around when it started. The patient denies a cough. He did have a cold recently with some congestion. The patient reports that his chest pain is sharp and on the left side of his chest. He denies any sweats nausea vomiting or radiation. The patient does have a history of anxiety reports he's had this pain before. He did not take any pain medication for this chest pain. The patient reports today though that he misses anxiety medication and he does have a prescription to pick up at the pharmacy. He reports that the pain is feeling better and a 5 out of 10 in intensity. The patient came in for evaluation of this pain.   Past Medical History  Diagnosis Date  . Acid reflux   . Anxiety     There are no active problems to display for this patient.   History reviewed. No pertinent past surgical history.  Current Outpatient Rx  Name  Route  Sig  Dispense  Refill  . oxyCODONE-acetaminophen (ROXICET) 5-325 MG per tablet   Oral   Take 1-2 tablets by mouth every 4 (four) hours as needed for severe pain.   20 tablet   0     Allergies Tramadol  History reviewed. No pertinent family history.  Social History Social History  Substance Use Topics  . Smoking status: Current Every Day Smoker -- 0.50 packs/day  . Smokeless tobacco: None  . Alcohol Use: No    Review of Systems Constitutional: No fever/chills Eyes: No visual changes. ENT: No sore throat. Cardiovascular: chest pain. Respiratory:  shortness of breath. Gastrointestinal: No abdominal pain.  No  nausea, no vomiting.  No diarrhea.  No constipation. Genitourinary: Negative for dysuria. Musculoskeletal: Negative for back pain. Skin: Negative for rash. Neurological: Negative for headaches, focal weakness or numbness.  10-point ROS otherwise negative.  ____________________________________________   PHYSICAL EXAM:  VITAL SIGNS: ED Triage Vitals  Enc Vitals Group     BP 03/09/15 0127 127/95 mmHg     Pulse Rate 03/09/15 0127 77     Resp 03/09/15 0127 12     Temp 03/09/15 0127 97.8 F (36.6 C)     Temp Source 03/09/15 0127 Oral     SpO2 03/09/15 0127 99 %     Weight 03/09/15 0127 164 lb (74.39 kg)     Height 03/09/15 0127  (1.727 m)     Head Cir --      Peak Flow --      Pain Score 03/09/15 0128 5     Pain Loc --      Pain Edu? --      Excl. in GC? --     Constitutional: Alert and oriented. Well appearing and in mild distress. Eyes: Conjunctivae are normal. PERRL. EOMI. Head: Atraumatic. Nose: No congestion/rhinnorhea. Mouth/Throat: Mucous membranes are moist.  Oropharynx non-erythematous. Cardiovascular: Normal rate, regular rhythm. Grossly normal heart sounds.  Good peripheral circulation. Respiratory: Normal respiratory effort.  No retractions. Lungs CTAB. Gastrointestinal: Soft and nontender. No distention. Positive bowel sounds Musculoskeletal: No lower extremity tenderness nor edema.   Neurologic:  Normal speech and language. No gross focal neurologic deficits are appreciated.  Skin:  Skin is warm, dry and intact.  Psychiatric: Mood and affect are normal.   ____________________________________________   LABS (all labs ordered are listed, but only abnormal results are displayed)  Labs Reviewed  CBC  BASIC METABOLIC PANEL  TROPONIN I  TROPONIN I   ____________________________________________  EKG  ED ECG REPORT I, Rebecka ApleyWebster,  Allison P, the attending physician, personally viewed and interpreted this ECG.   Date: 03/09/2015  EKG Time: 128  Rate:  87  Rhythm: normal sinus rhythm  Axis: Normal  Intervals:none  ST&T Change: None  ____________________________________________  RADIOLOGY  Chest x-ray: Normal chest x-ray ____________________________________________   PROCEDURES  Procedure(s) performed: None  Critical Care performed: No  ____________________________________________   INITIAL IMPRESSION / ASSESSMENT AND PLAN / ED COURSE  Pertinent labs & imaging results that were available during my care of the patient were reviewed by me and considered in my medical decision making (see chart for details).  The patient is a 39 year old male who has a history of anxiety who comes in tonight with chest pain and shortness of breath which she reports feels much like his anxiety. When I walked into the patient's room he was sleeping and did not appear to be in any acute distress. He reports that he has prescriptions for his anxiety medication to pick up in the pharmacy. I did 2 sets of troponins and the patient did not complain of any worsening symptoms and he was feeling comfortable and better than he had been when he arrived. I will discharge the patient to home as both of his cardiac enzymes are negative and have him follow back up with his primary care physician. ____________________________________________   FINAL CLINICAL IMPRESSION(S) / ED DIAGNOSES  Final diagnoses:  Chest pain, unspecified chest pain type  Anxiety      Rebecka ApleyAllison P Webster, MD 03/09/15 [phone]

## 2015-03-09 NOTE — ED Notes (Signed)
Pt to X Ray.

## 2015-03-09 NOTE — ED Notes (Signed)
Pt arrived via EMS c/o chest pain made worse with breathing and feels light headed at times denies nausea, vomiting, or pain radiating. Patient believes chest pain is related to his anxiety and states he has not taken his anxiety  medications (Xanax 1 mg) in 2 days.

## 2015-03-09 NOTE — ED Notes (Signed)
Pt uprite on stretcher in exam room with eyes closed; resp even/unlab, no distress noted; pt awakens easily for repeat blood draw

## 2015-03-09 NOTE — Discharge Instructions (Signed)
Nonspecific Chest Pain  °Chest pain can be caused by many different conditions. There is always a chance that your pain could be related to something serious, such as a heart attack or a blood clot in your lungs. Chest pain can also be caused by conditions that are not life-threatening. If you have chest pain, it is very important to follow up with your health care provider. °CAUSES  °Chest pain can be caused by: °· Heartburn. °· Pneumonia or bronchitis. °· Anxiety or stress. °· Inflammation around your heart (pericarditis) or lung (pleuritis or pleurisy). °· A blood clot in your lung. °· A collapsed lung (pneumothorax). It can develop suddenly on its own (spontaneous pneumothorax) or from trauma to the chest. °· Shingles infection (varicella-zoster virus). °· Heart attack. °· Damage to the bones, muscles, and cartilage that make up your chest wall. This can include: °¨ Bruised bones due to injury. °¨ Strained muscles or cartilage due to frequent or repeated coughing or overwork. °¨ Fracture to one or more ribs. °¨ Sore cartilage due to inflammation (costochondritis). °RISK FACTORS  °Risk factors for chest pain may include: °· Activities that increase your risk for trauma or injury to your chest. °· Respiratory infections or conditions that cause frequent coughing. °· Medical conditions or overeating that can cause heartburn. °· Heart disease or family history of heart disease. °· Conditions or health behaviors that increase your risk of developing a blood clot. °· Having had chicken pox (varicella zoster). °SIGNS AND SYMPTOMS °Chest pain can feel like: °· Burning or tingling on the surface of your chest or deep in your chest. °· Crushing, pressure, aching, or squeezing pain. °· Dull or sharp pain that is worse when you move, cough, or take a deep breath. °· Pain that is also felt in your back, neck, shoulder, or arm, or pain that spreads to any of these areas. °Your chest pain may come and go, or it may stay  constant. °DIAGNOSIS °Lab tests or other studies may be needed to find the cause of your pain. Your health care provider may have you take a test called an ambulatory ECG (electrocardiogram). An ECG records your heartbeat patterns at the time the test is performed. You may also have other tests, such as: °· Transthoracic echocardiogram (TTE). During echocardiography, sound waves are used to create a picture of all of the heart structures and to look at how blood flows through your heart. °· Transesophageal echocardiogram (TEE). This is a more advanced imaging test that obtains images from inside your body. It allows your health care provider to see your heart in finer detail. °· Cardiac monitoring. This allows your health care provider to monitor your heart rate and rhythm in real time. °· Holter monitor. This is a portable device that records your heartbeat and can help to diagnose abnormal heartbeats. It allows your health care provider to track your heart activity for several days, if needed. °· Stress tests. These can be done through exercise or by taking medicine that makes your heart beat more quickly. °· Blood tests. °· Imaging tests. °TREATMENT  °Your treatment depends on what is causing your chest pain. Treatment may include: °· Medicines. These may include: °¨ Acid blockers for heartburn. °¨ Anti-inflammatory medicine. °¨ Pain medicine for inflammatory conditions. °¨ Antibiotic medicine, if an infection is present. °¨ Medicines to dissolve blood clots. °¨ Medicines to treat coronary artery disease. °· Supportive care for conditions that do not require medicines. This may include: °¨ Resting. °¨ Applying heat   or cold packs to injured areas. °¨ Limiting activities until pain decreases. °HOME CARE INSTRUCTIONS °· If you were prescribed an antibiotic medicine, finish it all even if you start to feel better. °· Avoid any activities that bring on chest pain. °· Do not use any tobacco products, including  cigarettes, chewing tobacco, or electronic cigarettes. If you need help quitting, ask your health care provider. °· Do not drink alcohol. °· Take medicines only as directed by your health care provider. °· Keep all follow-up visits as directed by your health care provider. This is important. This includes any further testing if your chest pain does not go away. °· If heartburn is the cause for your chest pain, you may be told to keep your head raised (elevated) while sleeping. This reduces the chance that acid will go from your stomach into your esophagus. °· Make lifestyle changes as directed by your health care provider. These may include: °¨ Getting regular exercise. Ask your health care provider to suggest some activities that are safe for you. °¨ Eating a heart-healthy diet. A registered dietitian can help you to learn healthy eating options. °¨ Maintaining a healthy weight. °¨ Managing diabetes, if necessary. °¨ Reducing stress. °SEEK MEDICAL CARE IF: °· Your chest pain does not go away after treatment. °· You have a rash with blisters on your chest. °· You have a fever. °SEEK IMMEDIATE MEDICAL CARE IF:  °· Your chest pain is worse. °· You have an increasing cough, or you cough up blood. °· You have severe abdominal pain. °· You have severe weakness. °· You faint. °· You have chills. °· You have sudden, unexplained chest discomfort. °· You have sudden, unexplained discomfort in your arms, back, neck, or jaw. °· You have shortness of breath at any time. °· You suddenly start to sweat, or your skin gets clammy. °· You feel nauseous or you vomit. °· You suddenly feel light-headed or dizzy. °· Your heart begins to beat quickly, or it feels like it is skipping beats. °These symptoms may represent a serious problem that is an emergency. Do not wait to see if the symptoms will go away. Get medical help right away. Call your local emergency services (911 in the [place] Do not drive yourself to the hospital. °  °This  information is not intended to replace advice given to you by your health care provider. Make sure you discuss any questions you have with your health care provider. °  °Document Released: 01/26/2005 Document Revised: 05/09/2014 Document Reviewed: 11/22/2013 °Elsevier Interactive Patient Education ©2016 [employer] °Generalized Anxiety Disorder °Generalized anxiety disorder (GAD) is a mental disorder. It interferes with life functions, including relationships, work, and school. °GAD is different from normal anxiety, which everyone experiences at some point in their lives in response to specific life events and activities. Normal anxiety actually helps us prepare for and get through these life events and activities. Normal anxiety goes away after the event or activity is over.  °GAD causes anxiety that is not necessarily related to specific events or activities. It also causes excess anxiety in proportion to specific events or activities. The anxiety associated with GAD is also difficult to control. GAD can vary from mild to severe. People with severe GAD can have intense waves of anxiety with physical symptoms (panic attacks).  °SYMPTOMS °The anxiety and worry associated with GAD are difficult to control. This anxiety and worry are related to many life events and activities and also occur more days than not   for 6 months or longer. People with GAD also have three or more of the following symptoms (one or more in children): °· Restlessness.   °· Fatigue. °· Difficulty concentrating.   °· Irritability. °· Muscle tension. °· Difficulty sleeping or unsatisfying sleep. °DIAGNOSIS °GAD is diagnosed through an assessment by your health care provider. Your health care provider will ask you questions about your mood, physical symptoms, and events in your life. Your health care provider may ask you about your medical history and use of alcohol or drugs, including prescription medicines. Your health care provider may also do  a physical exam and blood tests. Certain medical conditions and the use of certain substances can cause symptoms similar to those associated with GAD. Your health care provider may refer you to a mental health specialist for further evaluation. °TREATMENT °The following therapies are usually used to treat GAD:  °· Medication. Antidepressant medication usually is prescribed for long-term daily control. Antianxiety medicines may be added in severe cases, especially when panic attacks occur.   °· Talk therapy (psychotherapy). Certain types of talk therapy can be helpful in treating GAD by providing support, education, and guidance. A form of talk therapy called cognitive behavioral therapy can teach you healthy ways to think about and react to daily life events and activities. °· Stress management techniques. These include yoga, meditation, and exercise and can be very helpful when they are practiced regularly. °A mental health specialist can help determine which treatment is best for you. Some people see improvement with one therapy. However, other people require a combination of therapies. °  °This information is not intended to replace advice given to you by your health care provider. Make sure you discuss any questions you have with your health care provider. °  °Document Released: 08/13/2012 Document Revised: 05/09/2014 Document Reviewed: 08/13/2012 °Elsevier Interactive Patient Education ©2016 [employer] ° °

## 2015-05-19 ENCOUNTER — Encounter: Payer: Self-pay | Admitting: Emergency Medicine

## 2015-05-19 ENCOUNTER — Emergency Department
Admission: EM | Admit: 2015-05-19 | Discharge: 2015-05-19 | Disposition: A | Discharge status: Home / Self Care | Payer: Medicaid Other | Attending: Emergency Medicine | Admitting: Emergency Medicine

## 2015-05-19 DIAGNOSIS — J069 Acute upper respiratory infection, unspecified: Secondary | ICD-10-CM | POA: Diagnosis not present

## 2015-05-19 DIAGNOSIS — R05 Cough: Secondary | ICD-10-CM | POA: Diagnosis present

## 2015-05-19 DIAGNOSIS — F17210 Nicotine dependence, cigarettes, uncomplicated: Secondary | ICD-10-CM | POA: Diagnosis not present

## 2015-05-19 MED ORDER — NAPROXEN 500 MG PO TABS
500.0000 mg | ORAL_TABLET | Freq: Once | ORAL | Status: AC
  Administered 2015-05-19: 500 mg via ORAL
  Filled 2015-05-19: qty 1

## 2015-05-19 MED ORDER — AZITHROMYCIN 250 MG PO TABS: ORAL_TABLET | ORAL | Status: AC

## 2015-05-19 MED ORDER — NAPROXEN 500 MG PO TABS: 500.0000 mg | ORAL_TABLET | Freq: Two times a day (BID) | ORAL | Status: DC

## 2015-05-19 NOTE — Discharge Instructions (Signed)
Upper Respiratory Infection, Adult Most upper respiratory infections (URIs) are a viral infection of the air passages leading to the lungs. A URI affects the nose, throat, and upper air passages. The most common type of URI is nasopharyngitis and is typically referred to as "the common cold." URIs run their course and usually go away on their own. Most of the time, a URI does not require medical attention, but sometimes a bacterial infection in the upper airways can follow a viral infection. This is called a secondary infection. Sinus and middle ear infections are common types of secondary upper respiratory infections. Bacterial pneumonia can also complicate a URI. A URI can worsen asthma and chronic obstructive pulmonary disease (COPD). Sometimes, these complications can require emergency medical care and may be life threatening.  CAUSES Almost all URIs are caused by viruses. A virus is a type of germ and can spread from one person to another.  RISKS FACTORS You may be at risk for a URI if:   You smoke.   You have chronic heart or lung disease.  You have a weakened defense (immune) system.   You are very young or very old.   You have nasal allergies or asthma.  You work in crowded or poorly ventilated areas.  You work in health care facilities or schools. SIGNS AND SYMPTOMS  Symptoms typically develop 2-3 days after you come in contact with a cold virus. Most viral URIs last 7-10 days. However, viral URIs from the influenza virus (flu virus) can last 14-18 days and are typically more severe. Symptoms may include:   Runny or stuffy (congested) nose.   Sneezing.   Cough.   Sore throat.   Headache.   Fatigue.   Fever.   Loss of appetite.   Pain in your forehead, behind your eyes, and over your cheekbones (sinus pain).  Muscle aches.  DIAGNOSIS  Your health care provider may diagnose a URI by:  Physical exam.  Tests to check that your symptoms are not due to  another condition such as:  Strep throat.  Sinusitis.  Pneumonia.  Asthma. TREATMENT  A URI goes away on its own with time. It cannot be cured with medicines, but medicines may be prescribed or recommended to relieve symptoms. Medicines may help:  Reduce your fever.  Reduce your cough.  Relieve nasal congestion. HOME CARE INSTRUCTIONS   Take medicines only as directed by your health care provider.   Gargle warm saltwater or take cough drops to comfort your throat as directed by your health care provider.  Use a warm mist humidifier or inhale steam from a shower to increase air moisture. This may make it easier to breathe.  Drink enough fluid to keep your urine clear or pale yellow.   Eat soups and other clear broths and maintain good nutrition.   Rest as needed.   Return to work when your temperature has returned to normal or as your health care provider advises. You may need to stay home longer to avoid infecting others. You can also use a face mask and careful hand washing to prevent spread of the virus.  Increase the usage of your inhaler if you have asthma.   Do not use any tobacco products, including cigarettes, chewing tobacco, or electronic cigarettes. If you need help quitting, ask your health care provider. PREVENTION  The best way to protect yourself from getting a cold is to practice good hygiene.   Avoid oral or hand contact with people with cold   symptoms.   Wash your hands often if contact occurs.  There is no clear evidence that vitamin C, vitamin E, echinacea, or exercise reduces the chance of developing a cold. However, it is always recommended to get plenty of rest, exercise, and practice good nutrition.  SEEK MEDICAL CARE IF:   You are getting worse rather than better.   Your symptoms are not controlled by medicine.   You have chills.  You have worsening shortness of breath.  You have brown or red mucus.  You have yellow or brown nasal  discharge.  You have pain in your face, especially when you bend forward.  You have a fever.  You have swollen neck glands.  You have pain while swallowing.  You have white areas in the back of your throat. SEEK IMMEDIATE MEDICAL CARE IF:   You have severe or persistent:  Headache.  Ear pain.  Sinus pain.  Chest pain.  You have chronic lung disease and any of the following:  Wheezing.  Prolonged cough.  Coughing up blood.  A change in your usual mucus.  You have a stiff neck.  You have changes in your:  Vision.  Hearing.  Thinking.  Mood. MAKE SURE YOU:   Understand these instructions.  Will watch your condition.  Will get help right away if you are not doing well or get worse.   This information is not intended to replace advice given to you by your health care provider. Make sure you discuss any questions you have with your health care provider.   Document Released: 10/12/2000 Document Revised: 09/02/2014 Document Reviewed: 07/24/2013 Elsevier Interactive Patient Education 2016 [employer] Inc.  

## 2015-05-19 NOTE — ED Notes (Signed)
Pt presents with cough for one week.  

## 2015-05-19 NOTE — ED Provider Notes (Signed)
CSN: [id number]     Arrival date & time 05/19/15  1151 History   First MD Initiated Contact with Patient 05/19/15 1250     Chief Complaint  Patient presents with  . Cough     HPI Comments: 40 year old male presents today complaining of cough for the past week. It has been productive at times of thick, green mucous. No difficulty breathing but has felt some wheezing in his chest. Smokes 1/2ppd of cigarettes. Taking over the counter cold medicine without relief. No fevers, sweats or chills. Has a dull frontal headache associated with his symptoms.    Past Medical History  Diagnosis Date  . Acid reflux   . Anxiety    History reviewed. No pertinent past surgical history. No family history on file. Social History  Substance Use Topics  . Smoking status: Current Every Day Smoker -- 0.50 packs/day  . Smokeless tobacco: None  . Alcohol Use: No    Review of Systems  Constitutional: Negative for fever and chills.  HENT: Positive for congestion.   Respiratory: Positive for cough and wheezing. Negative for shortness of breath.   Musculoskeletal: Negative for myalgias and arthralgias.  Skin: Negative for rash.  Neurological: Positive for headaches.  All other systems reviewed and are negative.     Allergies  Tramadol  Home Medications   Prior to Admission medications   Medication Sig Start Date End Date Taking? Authorizing Provider  azithromycin (ZITHROMAX Z-PAK) 250 MG tablet Take 2 tablets (500 mg) on  Day 1,  followed by 1 tablet (250 mg) once daily on Days 2 through 5. 05/19/15 05/24/15  Christella Scheuermann, PA-C  naproxen (NAPROSYN) 500 MG tablet Take 1 tablet (500 mg total) by mouth 2 (two) times daily with a meal. 05/19/15   Christella Scheuermann, PA-C   BP 143/97 mmHg  Pulse 84  Temp(Src) 98.6 F (37 C) (Oral)  Resp 16  Ht  (1.702 m)  Wt 71.668 kg  BMI 24.74 kg/m2  SpO2 96% Physical Exam  Constitutional: He is oriented to person, place, and time. Vital signs are normal. He  appears well-developed and well-nourished. He is active.  Non-toxic appearance. He does not have a sickly appearance. He does not appear ill.  HENT:  Head: Normocephalic and atraumatic.  Right Ear: Tympanic membrane, external ear and ear canal normal.  Left Ear: Tympanic membrane, external ear and ear canal normal.  Nose: Mucosal edema and rhinorrhea present.  Mouth/Throat: Uvula is midline, oropharynx is clear and moist and mucous membranes are normal.  Eyes: Conjunctivae and EOM are normal. Pupils are equal, round, and reactive to light.  Neck: Normal range of motion. Neck supple.  Cardiovascular: Normal rate, regular rhythm, normal heart sounds and intact distal pulses.  Exam reveals no gallop and no friction rub.   No murmur heard. Pulmonary/Chest: Effort normal and breath sounds normal. No respiratory distress. He has no wheezes. He has no rales.  Musculoskeletal: Normal range of motion.  Lymphadenopathy:    He has no cervical adenopathy.  Neurological: He is alert and oriented to person, place, and time.  Skin: Skin is warm and dry.  Psychiatric: He has a normal mood and affect. His behavior is normal. Judgment and thought content normal.  Nursing note and vitals reviewed.   ED Course  Procedures (including critical care time) Labs Review Labs Reviewed - No data to display  Imaging Review No results found. I have personally reviewed and evaluated these images and lab results  as part of my medical decision-making.   EKG Interpretation None      MDM  Normal exam, afebrile VSS. Treat with Zpak, encouraged mucinex over the counter, drink plenty of fluids, antipyretics as needed. Gave naproxen to take as needed for headaches - do not combine with motrin  Final diagnoses:  URI (upper respiratory infection)        Christella Scheuermann, PA-C 05/19/15 1400  Myrna Blazer, MD 05/19/15 [phone]

## 2015-06-01 ENCOUNTER — Emergency Department: Payer: Medicaid Other

## 2015-06-01 ENCOUNTER — Emergency Department
Admission: EM | Admit: 2015-06-01 | Discharge: 2015-06-01 | Disposition: A | Discharge status: Home / Self Care | Payer: Medicaid Other | Attending: Emergency Medicine | Admitting: Emergency Medicine

## 2015-06-01 ENCOUNTER — Encounter: Payer: Self-pay | Admitting: Emergency Medicine

## 2015-06-01 DIAGNOSIS — F17210 Nicotine dependence, cigarettes, uncomplicated: Secondary | ICD-10-CM | POA: Insufficient documentation

## 2015-06-01 DIAGNOSIS — R05 Cough: Secondary | ICD-10-CM | POA: Diagnosis present

## 2015-06-01 DIAGNOSIS — Z791 Long term (current) use of non-steroidal anti-inflammatories (NSAID): Secondary | ICD-10-CM | POA: Diagnosis not present

## 2015-06-01 DIAGNOSIS — J209 Acute bronchitis, unspecified: Secondary | ICD-10-CM | POA: Insufficient documentation

## 2015-06-01 HISTORY — DX: Gastro-esophageal reflux disease without esophagitis: K21.9

## 2015-06-01 MED ORDER — PREDNISONE 10 MG PO TABS: ORAL_TABLET | ORAL | Status: DC

## 2015-06-01 MED ORDER — ACETAMINOPHEN 500 MG PO TABS
1000.0000 mg | ORAL_TABLET | Freq: Once | ORAL | Status: AC
  Administered 2015-06-01: 1000 mg via ORAL
  Filled 2015-06-01: qty 2

## 2015-06-01 NOTE — ED Provider Notes (Signed)
[facility] Emergency Department Provider Note  ____________________________________________  Time seen: Approximately 2:10 PM  I have reviewed the triage vital signs and the nursing notes.   HISTORY  Chief Complaint Cough   HPI Philip Owen is a 40 y.o. male is here with complaint of intermittent coughing for 1 month. He states it is been worse the last 3 days. Patient was seen on 1/17 at which time he was placed on a Z-Pakwhich he states he does not believe he was on long enough. He still continues to cough but denies any wheezing. He is not aware of any fever, denies throat or ear pain. Patient smokes one half pack cigarettes per day.   Past Medical History  Diagnosis Date  . Acid reflux   . Anxiety   . GERD (gastroesophageal reflux disease)     There are no active problems to display for this patient.   History reviewed. No pertinent past surgical history.  Current Outpatient Rx  Name  Route  Sig  Dispense  Refill  . naproxen (NAPROSYN) 500 MG tablet   Oral   Take 1 tablet (500 mg total) by mouth 2 (two) times daily with a meal.   30 tablet   0   . predniSONE (DELTASONE) 10 MG tablet      Take 6 tablets  today, on day 2 take 5 tablets, day 3 take 4 tablets, day 4 take 3 tablets, day 5 take  2 tablets and 1 tablet the last day   21 tablet   0     Allergies Tramadol  No family history on file.  Social History Social History  Substance Use Topics  . Smoking status: Current Every Day Smoker -- 0.50 packs/day  . Smokeless tobacco: None  . Alcohol Use: No    Review of Systems Constitutional: No fever/chills Eyes: No visual changes. ENT: No sore throat. Cardiovascular: Denies chest pain. Respiratory: Denies shortness of breath. Positive cough Gastrointestinal: No abdominal pain.  No nausea, no vomiting.  No diarrhea.  No constipation. Genitourinary: Negative for dysuria. Musculoskeletal: Negative for back pain. Skin:  Negative for rash. Neurological: Negative for headaches, focal weakness or numbness.  10-point ROS otherwise negative.  ____________________________________________   PHYSICAL EXAM:  VITAL SIGNS: ED Triage Vitals  Enc Vitals Group     BP 06/01/15 1339 128/73 mmHg     Pulse Rate 06/01/15 1339 64     Resp 06/01/15 1339 20     Temp 06/01/15 1339 98.4 F (36.9 C)     Temp src --      SpO2 06/01/15 1339 96 %     Weight 06/01/15 1339 160 lb (72.576 kg)     Height 06/01/15 1339  (1.727 m)     Head Cir --      Peak Flow --      Pain Score 06/01/15 1340 8     Pain Loc --      Pain Edu? --      Excl. in GC? --     Constitutional: Alert and oriented. Well appearing and in no acute distress. Eyes: Conjunctivae are normal. PERRL. EOMI. Head: Atraumatic. Nose: No congestion/rhinnorhea.   EACs and TMs are clear bilaterally. Mouth/Throat: Mucous membranes are moist.  Oropharynx non-erythematous. Also posterior drainage present. Neck: No stridor.   Hematological/Lymphatic/Immunilogical: No cervical lymphadenopathy. Cardiovascular: Normal rate, regular rhythm. Grossly normal heart sounds.  Good peripheral circulation. Respiratory: Normal respiratory effort.  No retractions. Lungs CTAB. No wheezes present.  Gastrointestinal: Soft and nontender. No distention. Musculoskeletal: No lower extremity tenderness nor edema.  No joint effusions. Neurologic:  Normal speech and language. No gross focal neurologic deficits are appreciated. No gait instability. Skin:  Skin is warm, dry and intact. No rash noted. Psychiatric: Mood and affect are normal. Speech and behavior are normal.  ____________________________________________   LABS (all labs ordered are listed, but only abnormal results are displayed)  Labs Reviewed - No data to display   RADIOLOGY  Chest x-ray per radiologist shows bronchitic changes. ____________________________________________   PROCEDURES  Procedure(s)  performed: None  Critical Care performed: No  ____________________________________________   INITIAL IMPRESSION / ASSESSMENT AND PLAN / ED COURSE  Pertinent labs & imaging results that were available during my care of the patient were reviewed by me and considered in my medical decision making (see chart for details).  Patient was started on prednisone 6 day taper and told to increase fluids. He is also going to take Tylenol if needed for aches and over-the-counter decongestant. The posterior drainage. He is follow-up with his PCP if any continued problems. ____________________________________________   FINAL CLINICAL IMPRESSION(S) / ED DIAGNOSES  Final diagnoses:  Acute bronchitis, unspecified organism  Cigarette smoker      Tommi Rumps, PA-C 06/01/15 1508  Jeanmarie Plant, MD 06/01/15 [phone]

## 2015-06-01 NOTE — ED Notes (Signed)
Cough intermittent x 1 month, worse z 3 days

## 2015-06-01 NOTE — ED Notes (Signed)
Cough for about 1 month   Non prod     No fever  NAD noted at present

## 2015-06-01 NOTE — Discharge Instructions (Signed)
Acute Bronchitis Bronchitis is when the airways that extend from the windpipe into the lungs get red, puffy, and painful (inflamed). Bronchitis often causes thick spit (mucus) to develop. This leads to a cough. A cough is the most common symptom of bronchitis. In acute bronchitis, the condition usually begins suddenly and goes away over time (usually in 2 weeks). Smoking, allergies, and asthma can make bronchitis worse. Repeated episodes of bronchitis may cause more lung problems. HOME CARE  Rest.  Drink enough fluids to keep your pee (urine) clear or pale yellow (unless you need to limit fluids as told by your doctor).  Only take over-the-counter or prescription medicines as told by your doctor.  Avoid smoking and secondhand smoke. These can make bronchitis worse. If you are a smoker, think about using nicotine gum or skin patches. Quitting smoking will help your lungs heal faster.  Reduce the chance of getting bronchitis again by:  Washing your hands often.  Avoiding people with cold symptoms.  Trying not to touch your hands to your mouth, nose, or eyes.  Follow up with your doctor as told. GET HELP IF: Your symptoms do not improve after 1 week of treatment. Symptoms include:  Cough.  Fever.  Coughing up thick spit.  Body aches.  Chest congestion.  Chills.  Shortness of breath.  Sore throat. GET HELP RIGHT AWAY IF:   You have an increased fever.  You have chills.  You have severe shortness of breath.  You have bloody thick spit (sputum).  You throw up (vomit) often.  You lose too much body fluid (dehydration).  You have a severe headache.  You faint. MAKE SURE YOU:   Understand these instructions.  Will watch your condition.  Will get help right away if you are not doing well or get worse.   This information is not intended to replace advice given to you by your health care provider. Make sure you discuss any questions you have with your health care  provider.   Document Released: 10/05/2007 Document Revised: 12/19/2012 Document Reviewed: 10/09/2012 Elsevier Interactive Patient Education 2016 [employer].  Smoking Hazards Smoking cigarettes is extremely bad for your health. Tobacco smoke has over 200 known poisons in it. It contains the poisonous gases nitrogen oxide and carbon monoxide. There are over 60 chemicals in tobacco smoke that cause cancer. Some of the chemicals found in cigarette smoke include:   Cyanide.   Benzene.   Formaldehyde.   Methanol (wood alcohol).   Acetylene (fuel used in welding torches).   Ammonia.  Even smoking lightly shortens your life expectancy by several years. You can greatly reduce the risk of medical problems for you and your family by stopping now. Smoking is the most preventable cause of death and disease in our society. Within days of quitting smoking, your circulation improves, you decrease the risk of having a heart attack, and your lung capacity improves. There may be some increased phlegm in the first few days after quitting, and it may take months for your lungs to clear up completely. Quitting for 10 years reduces your risk of developing lung cancer to almost that of a nonsmoker.  WHAT ARE THE RISKS OF SMOKING? Cigarette smokers have an increased risk of many serious medical problems, including:  Lung cancer.   Lung disease (such as pneumonia, bronchitis, and emphysema).   Heart attack and chest pain due to the heart not getting enough oxygen (angina).   Heart disease and peripheral blood vessel disease.   Hypertension.  Stroke.   Oral cancer (cancer of the lip, mouth, or voice box).   Bladder cancer.   Pancreatic cancer.   Cervical cancer.   Pregnancy complications, including premature birth.   Stillbirths and smaller newborn babies, birth defects, and genetic damage to sperm.   Early menopause.   Lower estrogen level for women.   Infertility.    Facial wrinkles.   Blindness.   Increased risk of broken bones (fractures).   Senile dementia.   Stomach ulcers and internal bleeding.   Delayed wound healing and increased risk of complications during surgery. Because of secondhand smoke exposure, children of smokers have an increased risk of the following:   Sudden infant death syndrome (SIDS).   Respiratory infections.   Lung cancer.   Heart disease.   Ear infections.  WHY IS SMOKING ADDICTIVE? Nicotine is the chemical agent in tobacco that is capable of causing addiction or dependence. When you smoke and inhale, nicotine is absorbed rapidly into the bloodstream through your lungs. Both inhaled and noninhaled nicotine may be addictive.  WHAT ARE THE BENEFITS OF QUITTING?  There are many health benefits to quitting smoking. Some are:   The likelihood of developing cancer and heart disease decreases. Health improvements are seen almost immediately.   Blood pressure, pulse rate, and breathing patterns start returning to normal soon after quitting.   People who quit may see an improvement in their overall quality of life.  HOW DO YOU QUIT SMOKING? Smoking is an addiction with both physical and psychological effects, and longtime habits can be hard to change. Your health care provider can recommend:  Programs and community resources, which may include group support, education, or therapy.  Replacement products, such as patches, gum, and nasal sprays. Use these products only as directed. Do not replace cigarette smoking with electronic cigarettes (commonly called e-cigarettes). The safety of e-cigarettes is unknown, and some may contain harmful chemicals. FOR MORE INFORMATION  American Lung Association: www.lung.org  American Cancer Society: www.cancer.org   This information is not intended to replace advice given to you by your health care provider. Make sure you discuss any questions you have with your  health care provider.   Document Released: 05/26/2004 Document Revised: 02/06/2013 Document Reviewed: 10/08/2012 Elsevier Interactive Patient Education [employer]   Follow-up with your doctor if any continued problems. Take prednisone as prescribed and discontinue smoking. Increase fluids and Tylenol if needed for headache or throat pain.

## 2015-06-10 ENCOUNTER — Emergency Department: Payer: Medicaid Other

## 2015-06-10 ENCOUNTER — Encounter: Payer: Self-pay | Admitting: *Deleted

## 2015-06-10 ENCOUNTER — Emergency Department
Admission: EM | Admit: 2015-06-10 | Discharge: 2015-06-10 | Disposition: A | Discharge status: Home / Self Care | Payer: Medicaid Other | Attending: Emergency Medicine | Admitting: Emergency Medicine

## 2015-06-10 DIAGNOSIS — Y9289 Other specified places as the place of occurrence of the external cause: Secondary | ICD-10-CM | POA: Diagnosis not present

## 2015-06-10 DIAGNOSIS — W231XXA Caught, crushed, jammed, or pinched between stationary objects, initial encounter: Secondary | ICD-10-CM | POA: Insufficient documentation

## 2015-06-10 DIAGNOSIS — Y9389 Activity, other specified: Secondary | ICD-10-CM | POA: Insufficient documentation

## 2015-06-10 DIAGNOSIS — F17200 Nicotine dependence, unspecified, uncomplicated: Secondary | ICD-10-CM | POA: Insufficient documentation

## 2015-06-10 DIAGNOSIS — Y998 Other external cause status: Secondary | ICD-10-CM | POA: Insufficient documentation

## 2015-06-10 DIAGNOSIS — S60221A Contusion of right hand, initial encounter: Secondary | ICD-10-CM | POA: Insufficient documentation

## 2015-06-10 DIAGNOSIS — S6991XA Unspecified injury of right wrist, hand and finger(s), initial encounter: Secondary | ICD-10-CM | POA: Diagnosis present

## 2015-06-10 MED ORDER — IBUPROFEN 800 MG PO TABS: 800.0000 mg | ORAL_TABLET | Freq: Three times a day (TID) | ORAL | Status: DC | PRN

## 2015-06-10 MED ORDER — OXYCODONE-ACETAMINOPHEN 5-325 MG PO TABS
2.0000 | ORAL_TABLET | Freq: Once | ORAL | Status: AC
  Administered 2015-06-10: 2 via ORAL
  Filled 2015-06-10: qty 2

## 2015-06-10 MED ORDER — HYDROCODONE-ACETAMINOPHEN 5-325 MG PO TABS: 1.0000 | ORAL_TABLET | ORAL | Status: DC | PRN

## 2015-06-10 NOTE — Discharge Instructions (Signed)
Hand Contusion  A hand contusion is a deep bruise on your hand area. Contusions are the result of an injury that caused bleeding under the skin. The contusion may turn blue, purple, or yellow. Minor injuries will give you a painless contusion, but more severe contusions may stay painful and swollen for a few weeks.  CAUSES   A contusion is usually caused by a blow, trauma, or direct force to an area of the body.  SYMPTOMS    Swelling and redness of the injured area.   Discoloration of the injured area.   Tenderness and soreness of the injured area.   Pain.  DIAGNOSIS   The diagnosis can be made by taking a history and performing a physical exam. An X-ray, CT scan, or MRI may be needed to determine if there were any associated injuries, such as broken bones (fractures).  TREATMENT   Often, the best treatment for a hand contusion is resting, elevating, icing, and applying cold compresses to the injured area. Over-the-counter medicines may also be recommended for pain control.  HOME CARE INSTRUCTIONS    Put ice on the injured area.    Put ice in a plastic bag.    Place a towel between your skin and the bag.    Leave the ice on for 15-20 minutes, 03-04 times a day.   Only take over-the-counter or prescription medicines as directed by your caregiver. Your caregiver may recommend avoiding anti-inflammatory medicines (aspirin, ibuprofen, and naproxen) for 48 hours because these medicines may increase bruising.   If told, use an elastic wrap as directed. This can help reduce swelling. You may remove the wrap for sleeping, showering, and bathing. If your fingers become numb, cold, or blue, take the wrap off and reapply it more loosely.   Elevate your hand with pillows to reduce swelling.   Avoid overusing your hand if it is painful.  SEEK IMMEDIATE MEDICAL CARE IF:    You have increased redness, swelling, or pain in your hand.   Your swelling or pain is not relieved with medicines.   You have loss of feeling in  your hand or are unable to move your fingers.   Your hand turns cold or blue.   You have pain when you move your fingers.   Your hand becomes warm to the touch.   Your contusion does not improve in 2 days.  MAKE SURE YOU:    Understand these instructions.   Will watch your condition.   Will get help right away if you are not doing well or get worse.     This information is not intended to replace advice given to you by your health care provider. Make sure you discuss any questions you have with your health care provider.     Document Released: 10/08/2001 Document Revised: 01/11/2012 Document Reviewed: 10/10/2011  Elsevier Interactive Patient Education 2016 Elsevier Inc.

## 2015-06-10 NOTE — ED Notes (Signed)
States a dresser came down and slammed his right hand yesterday

## 2015-06-10 NOTE — ED Provider Notes (Signed)
[facility] Emergency Department Provider Note  ____________________________________________  Time seen: Approximately 4:49 PM  I have reviewed the triage vital signs and the nursing notes.   HISTORY  Chief Complaint Hand Pain    HPI Philip Owen is a 40 y.o. male who presents for evaluation of right hand pain. Patient reports that a dresser slammed down on his hand earlier today.His pain is 10 over 10. Denies any numbness or tingling but is concerned about swelling of the back of his hand.   Past Medical History  Diagnosis Date  . Acid reflux   . Anxiety   . GERD (gastroesophageal reflux disease)     There are no active problems to display for this patient.   History reviewed. No pertinent past surgical history.  Current Outpatient Rx  Name  Route  Sig  Dispense  Refill  . HYDROcodone-acetaminophen (NORCO) 5-325 MG tablet   Oral   Take 1-2 tablets by mouth every 4 (four) hours as needed for moderate pain.   15 tablet   0   . ibuprofen (ADVIL,MOTRIN) 800 MG tablet   Oral   Take 1 tablet (800 mg total) by mouth every 8 (eight) hours as needed.   30 tablet   0     Allergies Tramadol  History reviewed. No pertinent family history.  Social History Social History  Substance Use Topics  . Smoking status: Current Every Day Smoker -- 0.50 packs/day  . Smokeless tobacco: None  . Alcohol Use: No    Review of Systems Constitutional: No fever/chills Musculoskeletal: Positive for right hand pain. Skin: Negative for rash. Neurological: Negative for headaches, focal weakness or numbness.  10-point ROS otherwise negative.  ____________________________________________   PHYSICAL EXAM:  VITAL SIGNS: ED Triage Vitals  Enc Vitals Group     BP 06/10/15 1635 166/112 mmHg     Pulse Rate 06/10/15 1635 91     Resp 06/10/15 1635 20     Temp 06/10/15 1635 98.2 F (36.8 C)     Temp Source 06/10/15 1635 Oral     SpO2 06/10/15 1635 100  %     Weight 06/10/15 1635 165 lb (74.844 kg)     Height 06/10/15 1635  (1.727 m)     Head Cir --      Peak Flow --      Pain Score 06/10/15 1634 10     Pain Loc --      Pain Edu? --      Excl. in GC? --     Constitutional: Alert and oriented. Well appearing and in no acute distress.  Cardiovascular: Normal rate, regular rhythm. Grossly normal heart sounds.  Good peripheral circulation. Respiratory: Normal respiratory effort.  No retractions. Lungs CTAB. Musculoskeletal: Right hand with positive edema noted over the dorsal aspect. Distally neurovascularly intact. Increased pain with pronation supination. Neurologic:  Normal speech and language. No gross focal neurologic deficits are appreciated. No gait instability. Skin:  Skin is warm, dry and intact. No rash noted. Psychiatric: Mood and affect are normal. Speech and behavior are normal.  ____________________________________________   LABS (all labs ordered are listed, but only abnormal results are displayed)  Labs Reviewed - No data to display ____________________________________________   RADIOLOGY  IMPRESSION: Normal right hand. ____________________________________________   PROCEDURES  Procedure(s) performed: None  Critical Care performed: No  ____________________________________________   INITIAL IMPRESSION / ASSESSMENT AND PLAN / ED COURSE  Pertinent labs & imaging results that were available during my care  of the patient were reviewed by me and considered in my medical decision making (see chart for details).  Acute right hand contusion. No fracture. Rx given for Motrin 800 mg 3 times a day Vicodin 5/325. Ace wrap provided for comfort. Work excuse 24 hours given patient follow-up with PCP or return to the ER with any worsening symptomology. ____________________________________________   FINAL CLINICAL IMPRESSION(S) / ED DIAGNOSES  Final diagnoses:  Hand contusion, right, initial encounter      This chart was dictated using voice recognition software/Dragon. Despite best efforts to proofread, errors can occur which can change the meaning. Any change was purely unintentional.   Evangeline Dakin, PA-C 06/10/15 1900  Phineas Semen, MD 06/10/15 [phone]

## 2015-08-16 ENCOUNTER — Emergency Department
Admission: EM | Admit: 2015-08-16 | Discharge: 2015-08-16 | Disposition: A | Discharge status: Home / Self Care | Payer: Medicaid Other | Attending: Emergency Medicine | Admitting: Emergency Medicine

## 2015-08-16 DIAGNOSIS — F17200 Nicotine dependence, unspecified, uncomplicated: Secondary | ICD-10-CM | POA: Diagnosis not present

## 2015-08-16 DIAGNOSIS — R300 Dysuria: Secondary | ICD-10-CM | POA: Diagnosis present

## 2015-08-16 DIAGNOSIS — N390 Urinary tract infection, site not specified: Secondary | ICD-10-CM | POA: Insufficient documentation

## 2015-08-16 LAB — URINALYSIS COMPLETE WITH MICROSCOPIC (ARMC ONLY)
BILIRUBIN URINE: NEGATIVE
Bacteria, UA: NONE SEEN
Glucose, UA: NEGATIVE mg/dL
Hgb urine dipstick: NEGATIVE
KETONES UR: NEGATIVE mg/dL
Leukocytes, UA: NEGATIVE
Nitrite: NEGATIVE
PH: 5 (ref 5.0–8.0)
PROTEIN: NEGATIVE mg/dL
Specific Gravity, Urine: 1.015 (ref 1.005–1.030)

## 2015-08-16 MED ORDER — PHENAZOPYRIDINE HCL 100 MG PO TABS: 100.0000 mg | ORAL_TABLET | Freq: Three times a day (TID) | ORAL | Status: DC | PRN

## 2015-08-16 MED ORDER — SULFAMETHOXAZOLE-TRIMETHOPRIM 800-160 MG PO TABS: 1.0000 | ORAL_TABLET | Freq: Two times a day (BID) | ORAL | Status: DC

## 2015-08-16 NOTE — ED Provider Notes (Signed)
[facility] Emergency Department Provider Note  ____________________________________________  Time seen: Approximately 12:07 PM  I have reviewed the triage vital signs and the nursing notes.   HISTORY  Chief Complaint Dysuria   HPI Philip Owen is a 40 y.o. male is here with complaint of dysuria for the last 2 days. Patient states that it is a burning sensation and he is going more frequently. He denies any fever, chills, nausea, vomiting or flank pain. He denies any penile discharge. He states he has had a urinary tract infection in the past but is been years ago. He also denies any history of kidney stones.He denies any hematuria.   Past Medical History  Diagnosis Date  . Acid reflux   . Anxiety   . GERD (gastroesophageal reflux disease)     There are no active problems to display for this patient.   No past surgical history on file.  Current Outpatient Rx  Name  Route  Sig  Dispense  Refill  . HYDROcodone-acetaminophen (NORCO) 5-325 MG tablet   Oral   Take 1-2 tablets by mouth every 4 (four) hours as needed for moderate pain.   15 tablet   0   . ibuprofen (ADVIL,MOTRIN) 800 MG tablet   Oral   Take 1 tablet (800 mg total) by mouth every 8 (eight) hours as needed.   30 tablet   0   . phenazopyridine (PYRIDIUM) 100 MG tablet   Oral   Take 1 tablet (100 mg total) by mouth 3 (three) times daily as needed for pain.   15 tablet   0   . sulfamethoxazole-trimethoprim (BACTRIM DS,SEPTRA DS) 800-160 MG tablet   Oral   Take 1 tablet by mouth 2 (two) times daily.   20 tablet   0     Allergies Tramadol  No family history on file.  Social History Social History  Substance Use Topics  . Smoking status: Current Every Day Smoker -- 0.50 packs/day  . Smokeless tobacco: Not on file  . Alcohol Use: No    Review of Systems Constitutional: No fever/chills Cardiovascular: Denies chest pain. Respiratory: Denies shortness of  breath. Gastrointestinal: No abdominal pain.  No nausea, no vomiting.   Genitourinary: Positive for dysuria. Musculoskeletal: Negative for back pain. Skin: Negative for rash. Neurological: Negative for headaches, focal weakness or numbness.  10-point ROS otherwise negative.  ____________________________________________   PHYSICAL EXAM:  VITAL SIGNS: ED Triage Vitals  Enc Vitals Group     BP 08/16/15 1130 127/78 mmHg     Pulse Rate 08/16/15 1130 67     Resp 08/16/15 1130 20     Temp 08/16/15 1130 97.6 F (36.4 C)     Temp Source 08/16/15 1130 Oral     SpO2 08/16/15 1130 100 %     Weight 08/16/15 1130 160 lb (72.576 kg)     Height 08/16/15 1130  (1.727 m)     Head Cir --      Peak Flow --      Pain Score --      Pain Loc --      Pain Edu? --      Excl. in GC? --     Constitutional: Alert and oriented. Well appearing and in no acute distress. Eyes: Conjunctivae are normal. PERRL. EOMI. Head: Atraumatic. Nose: No congestion/rhinnorhea. Neck: No stridor.   Cardiovascular: Normal rate, regular rhythm. Grossly normal heart sounds.  Good peripheral circulation. Respiratory: Normal respiratory effort.  No retractions. Lungs  CTAB. Gastrointestinal: Soft and nontender. No distention.  No CVA tenderness. Musculoskeletal  moves upper and lower extremities without any difficulty. Neurologic:  Normal speech and language. No gross focal neurologic deficits are appreciated. No gait instability. Skin:  Skin is warm, dry and intact. No rash noted. Psychiatric: Mood and affect are normal. Speech and behavior are normal.  ____________________________________________   LABS (all labs ordered are listed, but only abnormal results are displayed)  Labs Reviewed  URINALYSIS COMPLETEWITH MICROSCOPIC ([facility] ONLY) - Abnormal; Notable for the following:    Color, Urine YELLOW (*)    APPearance CLEAR (*)    Squamous Epithelial / LPF 0-5 (*)    All other components within normal limits    PROCEDURES  Procedure(s) performed: None  Critical Care performed: No  ____________________________________________   INITIAL IMPRESSION / ASSESSMENT AND PLAN / ED COURSE  Pertinent labs & imaging results that were available during my care of the patient were reviewed by me and considered in my medical decision making (see chart for details).  Patient is aware that he has urinary tract infection and states that this is something once before. He was not referred to a urologist at that time. We discussed patient following up with a urologist. Patient was placed on Bactrim DS twice a day for 10 days along with Pyridium if needed for dysuria. He is to increase fluids and also take Tylenol or ibuprofen if needed. He is encouraged to make an appointment with the urologist. ____________________________________________   FINAL CLINICAL IMPRESSION(S) / ED DIAGNOSES  Final diagnoses:  Acute urinary tract infection      Tommi RumpsRhonda L Deny Chevez, PA-C 08/16/15 1428

## 2015-08-16 NOTE — ED Notes (Signed)
Burning with urination for the past 2 days. denies any fever ,flank pain or penile discharge

## 2015-08-16 NOTE — Discharge Instructions (Signed)
Increase fluids. Begin taking Bactrim DS twice a day for 10 days and complete the entire prescription. Pyridium 100 mg 1 tablet 3 times a day which will cause your urine to turn a bright orange.  It will also help with the burning sensation. Follow up with Dr. Annabell HowellsWrenn    Call for an appointment.

## 2015-08-16 NOTE — ED Notes (Addendum)
Burning and pain with urination. Started 2 days ago. Denies any other pain

## 2016-03-01 ENCOUNTER — Emergency Department
Admission: EM | Admit: 2016-03-01 | Discharge: 2016-03-01 | Disposition: A | Discharge status: Home / Self Care | Payer: Medicaid Other | Attending: Emergency Medicine | Admitting: Emergency Medicine

## 2016-03-01 ENCOUNTER — Encounter: Payer: Self-pay | Admitting: Emergency Medicine

## 2016-03-01 DIAGNOSIS — F17200 Nicotine dependence, unspecified, uncomplicated: Secondary | ICD-10-CM | POA: Insufficient documentation

## 2016-03-01 DIAGNOSIS — Y939 Activity, unspecified: Secondary | ICD-10-CM | POA: Diagnosis not present

## 2016-03-01 DIAGNOSIS — X16XXXA Contact with hot heating appliances, radiators and pipes, initial encounter: Secondary | ICD-10-CM | POA: Insufficient documentation

## 2016-03-01 DIAGNOSIS — F419 Anxiety disorder, unspecified: Secondary | ICD-10-CM | POA: Diagnosis not present

## 2016-03-01 DIAGNOSIS — Y929 Unspecified place or not applicable: Secondary | ICD-10-CM | POA: Diagnosis not present

## 2016-03-01 DIAGNOSIS — Z79899 Other long term (current) drug therapy: Secondary | ICD-10-CM | POA: Diagnosis not present

## 2016-03-01 DIAGNOSIS — Z791 Long term (current) use of non-steroidal anti-inflammatories (NSAID): Secondary | ICD-10-CM | POA: Insufficient documentation

## 2016-03-01 DIAGNOSIS — T23221A Burn of second degree of single right finger (nail) except thumb, initial encounter: Secondary | ICD-10-CM | POA: Diagnosis present

## 2016-03-01 DIAGNOSIS — Y999 Unspecified external cause status: Secondary | ICD-10-CM | POA: Insufficient documentation

## 2016-03-01 MED ORDER — IBUPROFEN 600 MG PO TABS
600.0000 mg | ORAL_TABLET | Freq: Once | ORAL | Status: AC
  Administered 2016-03-01: 600 mg via ORAL
  Filled 2016-03-01: qty 1

## 2016-03-01 MED ORDER — OXYCODONE-ACETAMINOPHEN 5-325 MG PO TABS
1.0000 | ORAL_TABLET | Freq: Once | ORAL | Status: AC
  Administered 2016-03-01: 1 via ORAL
  Filled 2016-03-01: qty 1

## 2016-03-01 MED ORDER — IBUPROFEN 600 MG PO TABS: 600.0000 mg | ORAL_TABLET | Freq: Three times a day (TID) | ORAL | 0 refills | Status: DC | PRN

## 2016-03-01 MED ORDER — OXYCODONE-ACETAMINOPHEN 5-325 MG PO TABS: 1.0000 | ORAL_TABLET | Freq: Four times a day (QID) | ORAL | 0 refills | Status: DC | PRN

## 2016-03-01 MED ORDER — SILVER SULFADIAZINE 1 % EX CREA
TOPICAL_CREAM | Freq: Once | CUTANEOUS | Status: AC
  Administered 2016-03-01: 15:00:00 via TOPICAL
  Filled 2016-03-01: qty 85

## 2016-03-01 NOTE — ED Triage Notes (Signed)
Pt presents to ED with burn to right index finger. Pt states burned it last night on stove. Pt states a blister appeared and his wife popped it and now he is worried it is getting infected.

## 2016-03-01 NOTE — ED Provider Notes (Signed)
[facility] Emergency Department Provider Note   ____________________________________________   First MD Initiated Contact with Patient 03/01/16 1501     (approximate)  I have reviewed the triage vital signs and the nursing notes.   HISTORY  Chief Complaint Burn    HPI Philip KindredBradley S Clason is a 40 y.o. male patient burning his right index finger on the stove last night. Patient has a large blister and his wife use of pain to rupture the lesion. Patient states since then the areas become increasingly painful and red. Patient is taking ibuprofen for a mild pain relief. Patient denies loss sensation or loss of function of the finger. Patient rates his pain as a 9/10.Patient is concerned (to my become infected. Patient is right-hand dominant.   Past Medical History:  Diagnosis Date  . Acid reflux   . Anxiety   . GERD (gastroesophageal reflux disease)     There are no active problems to display for this patient.   No past surgical history on file.  Prior to Admission medications   Medication Sig Start Date End Date Taking? Authorizing Provider  HYDROcodone-acetaminophen (NORCO) 5-325 MG tablet Take 1-2 tablets by mouth every 4 (four) hours as needed for moderate pain. 06/10/15   Charmayne Sheerharles M Beers, PA-C  ibuprofen (ADVIL,MOTRIN) 600 MG tablet Take 1 tablet (600 mg total) by mouth every 8 (eight) hours as needed. 03/01/16   Joni Reiningonald K Smith, PA-C  ibuprofen (ADVIL,MOTRIN) 800 MG tablet Take 1 tablet (800 mg total) by mouth every 8 (eight) hours as needed. 06/10/15   Evangeline Dakinharles M Beers, PA-C  oxyCODONE-acetaminophen (ROXICET) 5-325 MG tablet Take 1 tablet by mouth every 6 (six) hours as needed. 03/01/16 03/01/17  Joni Reiningonald K Smith, PA-C  phenazopyridine (PYRIDIUM) 100 MG tablet Take 1 tablet (100 mg total) by mouth 3 (three) times daily as needed for pain. 08/16/15   Tommi Rumpshonda L Summers, PA-C  sulfamethoxazole-trimethoprim (BACTRIM DS,SEPTRA DS) 800-160 MG tablet Take 1 tablet  by mouth 2 (two) times daily. 08/16/15   Tommi Rumpshonda L Summers, PA-C    Allergies Tramadol  No family history on file.  Social History Social History  Substance Use Topics  . Smoking status: Current Every Day Smoker    Packs/day: 0.50  . Smokeless tobacco: Not on file  . Alcohol use No    Review of Systems Constitutional: No fever/chills Eyes: No visual changes. ENT: No sore throat. Cardiovascular: Denies chest pain. Respiratory: Denies shortness of breath. Gastrointestinal: No abdominal pain.  No nausea, no vomiting.  No diarrhea.  No constipation. Genitourinary: Negative for dysuria. Musculoskeletal: Negative for back pain. Skin: Negative for rash. Burn right index finger Neurological: Negative for headaches, focal weakness or numbness. Psychiatric:Anxiety Hematological/Lymphatic: Allergic/Immunilogical: Tramadol   ____________________________________________   PHYSICAL EXAM:  VITAL SIGNS: ED Triage Vitals  Enc Vitals Group     BP 03/01/16 1400 131/73     Pulse Rate 03/01/16 1400 (!) 59     Resp 03/01/16 1400 18     Temp 03/01/16 1400 98.1 F (36.7 C)     Temp Source 03/01/16 1400 Oral     SpO2 03/01/16 1400 100 %     Weight 03/01/16 1401 160 lb (72.6 kg)     Height 03/01/16 1401 5\' 8"  (1.727 m)     Head Circumference --      Peak Flow --      Pain Score 03/01/16 1401 9     Pain Loc --      Pain Edu? --  Excl. in GC? --     Constitutional: Alert and oriented. Well appearing and in no acute distress. Eyes: Conjunctivae are normal. PERRL. EOMI. Head: Atraumatic. Nose: No congestion/rhinnorhea. Mouth/Throat: Mucous membranes are moist.  Oropharynx non-erythematous. Neck: No stridor.  No cervical spine tenderness to palpation. Hematological/Lymphatic/Immunilogical: No cervical lymphadenopathy. Cardiovascular: Normal rate, regular rhythm. Grossly normal heart sounds.  Good peripheral circulation. Respiratory: Normal respiratory effort.  No retractions.  Lungs CTAB. Gastrointestinal: Soft and nontender. No distention. No abdominal bruits. No CVA tenderness. Musculoskeletal: No lower extremity tenderness nor edema.  No joint effusions. Neurologic:  Normal speech and language. No gross focal neurologic deficits are appreciated. No gait instability. Skin:  Skin is warm, dry and intact. No rash noted. Ruptured blister palmar aspect of the second digit right hand. Mild erythema. Psychiatric: Mood and affect are normal. Speech and behavior are normal.  ____________________________________________   LABS (all labs ordered are listed, but only abnormal results are displayed)  Labs Reviewed - No data to display ____________________________________________  EKG   ____________________________________________  RADIOLOGY   ____________________________________________   PROCEDURES  Procedure(s) performed: None  Procedures  Critical Care performed: No  ____________________________________________   INITIAL IMPRESSION / ASSESSMENT AND PLAN / ED COURSE  Pertinent labs & imaging results that were available during my care of the patient were reviewed by me and considered in my medical decision making (see chart for details).  Second degree burn second digit right hand. Patient given discharge care instructions. Patient get a prescription for Silvadene and Percocets. Patient advised return by ER if condition worsens.  Clinical Course     ____________________________________________   FINAL CLINICAL IMPRESSION(S) / ED DIAGNOSES  Final diagnoses:  Burn of finger, right, second degree, initial encounter      NEW MEDICATIONS STARTED DURING THIS VISIT:  New Prescriptions   IBUPROFEN (ADVIL,MOTRIN) 600 MG TABLET    Take 1 tablet (600 mg total) by mouth every 8 (eight) hours as needed.   OXYCODONE-ACETAMINOPHEN (ROXICET) 5-325 MG TABLET    Take 1 tablet by mouth every 6 (six) hours as needed.     Note:  This document was  prepared using Dragon voice recognition software and may include unintentional dictation errors.    Joni Reiningonald K Smith, PA-C 03/01/16 1511    Jeanmarie PlantJames A McShane, MD 03/03/16 [phone]

## 2016-04-06 ENCOUNTER — Emergency Department
Admission: EM | Admit: 2016-04-06 | Discharge: 2016-04-06 | Disposition: A | Discharge status: Home / Self Care | Payer: Medicaid Other | Attending: Student in an Organized Health Care Education/Training Program | Admitting: Student in an Organized Health Care Education/Training Program

## 2016-04-06 ENCOUNTER — Emergency Department: Payer: Medicaid Other

## 2016-04-06 DIAGNOSIS — F17200 Nicotine dependence, unspecified, uncomplicated: Secondary | ICD-10-CM | POA: Insufficient documentation

## 2016-04-06 DIAGNOSIS — J209 Acute bronchitis, unspecified: Secondary | ICD-10-CM | POA: Diagnosis not present

## 2016-04-06 DIAGNOSIS — Z79899 Other long term (current) drug therapy: Secondary | ICD-10-CM | POA: Insufficient documentation

## 2016-04-06 DIAGNOSIS — R0981 Nasal congestion: Secondary | ICD-10-CM | POA: Diagnosis present

## 2016-04-06 DIAGNOSIS — Z791 Long term (current) use of non-steroidal anti-inflammatories (NSAID): Secondary | ICD-10-CM | POA: Diagnosis not present

## 2016-04-06 DIAGNOSIS — J069 Acute upper respiratory infection, unspecified: Secondary | ICD-10-CM | POA: Diagnosis not present

## 2016-04-06 MED ORDER — FLUTICASONE PROPIONATE 50 MCG/ACT NA SUSP: 1.0000 | Freq: Every day | NASAL | 0 refills | Status: DC

## 2016-04-06 MED ORDER — PREDNISONE 20 MG PO TABS: 40.0000 mg | ORAL_TABLET | Freq: Every day | ORAL | 0 refills | Status: AC

## 2016-04-06 MED ORDER — ALBUTEROL SULFATE HFA 108 (90 BASE) MCG/ACT IN AERS: 2.0000 | INHALATION_SPRAY | Freq: Four times a day (QID) | RESPIRATORY_TRACT | 2 refills | Status: DC | PRN

## 2016-04-06 MED ORDER — DEXAMETHASONE 4 MG PO TABS
6.0000 mg | ORAL_TABLET | Freq: Once | ORAL | Status: AC
  Administered 2016-04-06: 6 mg via ORAL
  Filled 2016-04-06: qty 1.5

## 2016-04-06 NOTE — ED Triage Notes (Signed)
Per EMS report, patient called EMS for respiratory distress. Patient met the EMS in the yard. No dyspnea noted. Patient states he used his Symbicort inhaler today without relief. Per EMS, patient had clear lung sounds, 97% on room air, no chest pain and sinus tach of 104 on the monitor. Patient c/o cold-like symptoms for 4 days and feels "foggy headed." Patient has history of anxiety and HTN, takes Xanax for anxiety and is not compliant with B/P meds. Patient transferred out of the wheelchair in the lobby easily and ambulated with steady gait to the bathroom.

## 2016-04-06 NOTE — ED Provider Notes (Signed)
[facility] Emergency Department Provider Note    First MD Initiated Contact with Patient 04/06/16 2102     (approximate)  I have reviewed the triage vital signs and the nursing notes.   HISTORY  Chief Complaint Cough and Nasal Congestion    HPI Philip Owen is a 40 y.o. male smoker who presents with nasal congestion cough and shortness of breath that is worsening over the past 4 days. No measured fevers but has felt chills at night. Describes productive cough with green tinged sputum. No pleuritic chest pain no chest pain or pressure. No nausea or vomiting. He's been able to tolerate oral hydration. Patient is on Symbicort but does not have any albuterol for rescue inhalers. Has not been on any steroids recently. Does continue to smoke.   Past Medical History:  Diagnosis Date  . Acid reflux   . Anxiety   . GERD (gastroesophageal reflux disease)    History reviewed. No pertinent family history. History reviewed. No pertinent surgical history. There are no active problems to display for this patient.     Prior to Admission medications   Medication Sig Start Date End Date Taking? Authorizing Provider  HYDROcodone-acetaminophen (NORCO) 5-325 MG tablet Take 1-2 tablets by mouth every 4 (four) hours as needed for moderate pain. 06/10/15   Charmayne Sheerharles M Beers, PA-C  ibuprofen (ADVIL,MOTRIN) 600 MG tablet Take 1 tablet (600 mg total) by mouth every 8 (eight) hours as needed. 03/01/16   Joni Reiningonald K Smith, PA-C  ibuprofen (ADVIL,MOTRIN) 800 MG tablet Take 1 tablet (800 mg total) by mouth every 8 (eight) hours as needed. 06/10/15   Evangeline Dakinharles M Beers, PA-C  oxyCODONE-acetaminophen (ROXICET) 5-325 MG tablet Take 1 tablet by mouth every 6 (six) hours as needed. 03/01/16 03/01/17  Joni Reiningonald K Smith, PA-C  phenazopyridine (PYRIDIUM) 100 MG tablet Take 1 tablet (100 mg total) by mouth 3 (three) times daily as needed for pain. 08/16/15   Tommi Rumpshonda L Summers, PA-C    sulfamethoxazole-trimethoprim (BACTRIM DS,SEPTRA DS) 800-160 MG tablet Take 1 tablet by mouth 2 (two) times daily. 08/16/15   Tommi Rumpshonda L Summers, PA-C    Allergies Tramadol    Social History Social History  Substance Use Topics  . Smoking status: Current Every Day Smoker    Packs/day: 0.50  . Smokeless tobacco: Never Used  . Alcohol use No    Review of Systems Patient denies headaches, rhinorrhea, blurry vision, numbness, shortness of breath, chest pain, edema, cough, abdominal pain, nausea, vomiting, diarrhea, dysuria, fevers, rashes or hallucinations unless otherwise stated above in HPI. ____________________________________________   PHYSICAL EXAM:  VITAL SIGNS: Vitals:   04/06/16 2058  BP: (!) 155/90  Pulse: 91  Resp: 15  Temp: 98 F (36.7 C)    Constitutional: Alert and oriented. Well appearing and in no acute distress. Eyes: Conjunctivae are normal. PERRL. EOMI. Head: Atraumatic. Nose: No congestion/rhinnorhea. Mouth/Throat: Mucous membranes are moist.  Oropharynx non-erythematous. Neck: No stridor. Painless ROM. No cervical spine tenderness to palpation Hematological/Lymphatic/Immunilogical: No cervical lymphadenopathy. Cardiovascular: Normal rate, regular rhythm. Grossly normal heart sounds.  Good peripheral circulation. Respiratory: Normal respiratory effort.  No retractions. Coarse bilateral expiratory breath sounds. Gastrointestinal: Soft and nontender. No distention. No abdominal bruits. No CVA tenderness. Genitourinary:  Musculoskeletal: No lower extremity tenderness nor edema.  No joint effusions. Neurologic:  Normal speech and language. No gross focal neurologic deficits are appreciated. No gait instability. Skin:  Skin is warm, dry and intact. No rash noted. Psychiatric: Mood and affect are  normal. Speech and behavior are normal.  ____________________________________________   LABS (all labs ordered are listed, but only abnormal results are  displayed)  No results found for this or any previous visit (from the past 24 hour(s)). ____________________________________________ ____________________________________________  RADIOLOGY  I personally reviewed all radiographic images ordered to evaluate for the above acute complaints and reviewed radiology reports and findings.  These findings were personally discussed with the patient.  Please see medical record for radiology report.  ____________________________________________   PROCEDURES  Procedure(s) performed: none Procedures    Critical Care performed: no ____________________________________________   INITIAL IMPRESSION / ASSESSMENT AND PLAN / ED COURSE  Pertinent labs & imaging results that were available during my care of the patient were reviewed by me and considered in my medical decision making (see chart for details).  DDX: asthma, copd, pna, uri, flu  Philip Owen is a 40 y.o. who presents to the ED with nasal congestion and shortness of breath with productive cough. Patient arrives afebrile and hemodynamic stable. History and presentation most consistent with acute bronchitis. Patient has been using control her medications but does not have access to. Albuterol. Will start with steroids. We'll order chest x-ray to evaluate for any acute pulmonary abnormality.  Clinical Course    Chest x-ray shows no evidence of focal infiltrate. Does have diffuse hyperexpanded lungs consistent with a component of bronchitis and underlying asthma. Do not feel antibacterials clinically indicated at this time. The patient remains human dynamically stable without any dyspnea or hypoxia. Do not feel further diagnostic testing emergently indicated at this time.  We'll start patient on steroids as well as add rescue inhaler of albuterol and nasal decongestants.  Have discussed with the patient and available family all diagnostics and treatments performed thus far and all questions  were answered to the best of my ability. The patient demonstrates understanding and agreement with plan.   ____________________________________________   FINAL CLINICAL IMPRESSION(S) / ED DIAGNOSES  Final diagnoses:  None      NEW MEDICATIONS STARTED DURING THIS VISIT:  New Prescriptions   No medications on file     Note:  This document was prepared using Dragon voice recognition software and may include unintentional dictation errors.    Willy EddyPatrick Shakeela Rabadan, MD 04/06/16 2203

## 2016-05-03 ENCOUNTER — Emergency Department: Payer: Medicaid Other

## 2016-05-03 ENCOUNTER — Emergency Department
Admission: EM | Admit: 2016-05-03 | Discharge: 2016-05-03 | Disposition: A | Discharge status: Home / Self Care | Payer: Medicaid Other | Attending: Emergency Medicine | Admitting: Emergency Medicine

## 2016-05-03 ENCOUNTER — Encounter: Payer: Self-pay | Admitting: Emergency Medicine

## 2016-05-03 DIAGNOSIS — S022XXB Fracture of nasal bones, initial encounter for open fracture: Secondary | ICD-10-CM | POA: Diagnosis not present

## 2016-05-03 DIAGNOSIS — S0181XA Laceration without foreign body of other part of head, initial encounter: Secondary | ICD-10-CM

## 2016-05-03 DIAGNOSIS — Z79899 Other long term (current) drug therapy: Secondary | ICD-10-CM | POA: Insufficient documentation

## 2016-05-03 DIAGNOSIS — S0992XA Unspecified injury of nose, initial encounter: Secondary | ICD-10-CM | POA: Diagnosis present

## 2016-05-03 DIAGNOSIS — Y929 Unspecified place or not applicable: Secondary | ICD-10-CM | POA: Diagnosis not present

## 2016-05-03 DIAGNOSIS — Y939 Activity, unspecified: Secondary | ICD-10-CM | POA: Diagnosis not present

## 2016-05-03 DIAGNOSIS — Y999 Unspecified external cause status: Secondary | ICD-10-CM | POA: Insufficient documentation

## 2016-05-03 DIAGNOSIS — F17200 Nicotine dependence, unspecified, uncomplicated: Secondary | ICD-10-CM | POA: Diagnosis not present

## 2016-05-03 DIAGNOSIS — W228XXA Striking against or struck by other objects, initial encounter: Secondary | ICD-10-CM | POA: Insufficient documentation

## 2016-05-03 MED ORDER — OXYCODONE-ACETAMINOPHEN 5-325 MG PO TABS: 1.0000 | ORAL_TABLET | ORAL | 0 refills | Status: DC | PRN

## 2016-05-03 MED ORDER — ONDANSETRON 4 MG PO TBDP
4.0000 mg | ORAL_TABLET | Freq: Once | ORAL | Status: AC
  Administered 2016-05-03: 4 mg via ORAL
  Filled 2016-05-03: qty 1

## 2016-05-03 MED ORDER — OXYCODONE-ACETAMINOPHEN 5-325 MG PO TABS
1.0000 | ORAL_TABLET | Freq: Once | ORAL | Status: AC
  Administered 2016-05-03: 1 via ORAL
  Filled 2016-05-03: qty 1

## 2016-05-03 MED ORDER — CEPHALEXIN 500 MG PO CAPS: 500.0000 mg | ORAL_CAPSULE | Freq: Three times a day (TID) | ORAL | 0 refills | Status: DC

## 2016-05-03 MED ORDER — IBUPROFEN 800 MG PO TABS: 800.0000 mg | ORAL_TABLET | Freq: Three times a day (TID) | ORAL | 0 refills | Status: DC | PRN

## 2016-05-03 MED ORDER — CEPHALEXIN 500 MG PO CAPS
500.0000 mg | ORAL_CAPSULE | Freq: Once | ORAL | Status: AC
  Administered 2016-05-03: 500 mg via ORAL
  Filled 2016-05-03: qty 1

## 2016-05-03 NOTE — ED Provider Notes (Signed)
[facility] Emergency Department Provider Note   ____________________________________________   First MD Initiated Contact with Patient 05/03/16 0515     (approximate)  I have reviewed the triage vital signs and the nursing notes.   HISTORY  Chief Complaint Laceration    HPI Philip Owen is a 41 y.o. male who presents to the ED via EMS from home with a chief complaint of nose injury. Patient reports accidentally hitting himself in the bridge of his nose with a hammer approximately midnight. Denies associated LOC, headache, vision changes, chest pain, shortness of breath, abdominal pain, nausea, vomiting, dizziness. Tetanus is up-to-date. Patient denies use of anticoagulants. Denies recent travel. Nothing makes his symptoms better or worse.   Past Medical History:  Diagnosis Date  . Acid reflux   . Anxiety   . GERD (gastroesophageal reflux disease)     There are no active problems to display for this patient.   History reviewed. No pertinent surgical history.  Prior to Admission medications   Medication Sig Start Date End Date Taking? Authorizing Provider  albuterol (PROVENTIL HFA;VENTOLIN HFA) 108 (90 Base) MCG/ACT inhaler Inhale 2 puffs into the lungs every 6 (six) hours as needed for wheezing or shortness of breath. 04/06/16   Willy Eddy, MD  cephALEXin (KEFLEX) 500 MG capsule Take 1 capsule (500 mg total) by mouth 3 (three) times daily. 05/03/16   Irean Hong, MD  fluticasone (FLONASE) 50 MCG/ACT nasal spray Place 1 spray into both nostrils daily. 04/06/16 04/06/17  Willy Eddy, MD  HYDROcodone-acetaminophen (NORCO) 5-325 MG tablet Take 1-2 tablets by mouth every 4 (four) hours as needed for moderate pain. 06/10/15   Charmayne Sheer Beers, PA-C  ibuprofen (ADVIL,MOTRIN) 800 MG tablet Take 1 tablet (800 mg total) by mouth every 8 (eight) hours as needed for moderate pain. 05/03/16   Irean Hong, MD  oxyCODONE-acetaminophen (ROXICET) 5-325 MG  tablet Take 1 tablet by mouth every 4 (four) hours as needed for severe pain. 05/03/16   Irean Hong, MD  phenazopyridine (PYRIDIUM) 100 MG tablet Take 1 tablet (100 mg total) by mouth 3 (three) times daily as needed for pain. 08/16/15   Tommi Rumps, PA-C  sulfamethoxazole-trimethoprim (BACTRIM DS,SEPTRA DS) 800-160 MG tablet Take 1 tablet by mouth 2 (two) times daily. 08/16/15   Tommi Rumps, PA-C    Allergies Tramadol  No family history on file.  Social History Social History  Substance Use Topics  . Smoking status: Current Every Day Smoker    Packs/day: 0.50  . Smokeless tobacco: Never Used  . Alcohol use No    Review of Systems  Constitutional: No fever/chills. Eyes: No visual changes. ENT: Positive for nasal injury. No sore throat. Cardiovascular: Denies chest pain. Respiratory: Denies shortness of breath. Gastrointestinal: No abdominal pain.  No nausea, no vomiting.  No diarrhea.  No constipation. Genitourinary: Negative for dysuria. Musculoskeletal: Negative for back pain. Skin: Negative for rash. Neurological: Negative for headaches, focal weakness or numbness.  10-point ROS otherwise negative.  ____________________________________________   PHYSICAL EXAM:  VITAL SIGNS: ED Triage Vitals  Enc Vitals Group     BP 05/03/16 0345 (!) 146/81     Pulse --      Resp 05/03/16 0345 18     Temp 05/03/16 0345 97.6 F (36.4 C)     Temp Source 05/03/16 0345 Oral     SpO2 05/03/16 0345 97 %     Weight 05/03/16 0344 154 lb (69.9 kg)  Height 05/03/16 0344 5\' 8"  (1.727 m)     Head Circumference --      Peak Flow --      Pain Score 05/03/16 0344 9     Pain Loc --      Pain Edu? --      Excl. in GC? --     Constitutional: Alert and oriented. Well appearing and in no acute distress. Eyes: Conjunctivae are normal. PERRL. EOMI. Head: Atraumatic. Nose: Approximately 1 cm linear laceration on the bridge of nose which is nonbleeding and well approximated. Mild  swelling and developing ecchymosis to the site.  Mouth/Throat: Mucous membranes are moist.  Oropharynx non-erythematous. Neck: No stridor.  No cervical spine tenderness to palpation. Cardiovascular: Normal rate, regular rhythm. Grossly normal heart sounds.  Good peripheral circulation. Respiratory: Normal respiratory effort.  No retractions. Lungs CTAB. Gastrointestinal: Soft and nontender. No distention. No abdominal bruits. No CVA tenderness. Musculoskeletal: No lower extremity tenderness nor edema.  No joint effusions. Neurologic:  Normal speech and language. No gross focal neurologic deficits are appreciated. No gait instability. Skin:  Skin is warm, dry and intact. No rash noted. Psychiatric: Mood and affect are normal. Speech and behavior are normal.  ____________________________________________   LABS (all labs ordered are listed, but only abnormal results are displayed)  Labs Reviewed - No data to display ____________________________________________  EKG  None ____________________________________________  RADIOLOGY  Nasal bone x-rays (reviewed by me, interpreted per Dr. Clovis RileyMitchell):  Nondisplaced nasal bone fractures. ____________________________________________   PROCEDURES  Procedure(s) performed:   LACERATION REPAIR Performed by: Irean HongSUNG,JADE J Authorized by: Irean HongSUNG,JADE J Consent: Verbal consent obtained. Risks and benefits: risks, benefits and alternatives were discussed Consent given by: patient Patient identity confirmed: provided demographic data Prepped and Draped in normal sterile fashion Wound explored  Laceration Location: Nasal bridge   Laceration Length: 1cm  No Foreign Bodies seen or palpated  Anesthesia: None  Amount of cleaning: standard  Skin closure: Dermabond + Steri-strips  Technique: Standard  Patient tolerance: Patient tolerated the procedure well with no immediate complications.  Procedures  Critical Care performed:  No  ____________________________________________   INITIAL IMPRESSION / ASSESSMENT AND PLAN / ED COURSE  Pertinent labs & imaging results that were available during my care of the patient were reviewed by me and considered in my medical decision making (see chart for details).  41 year old male who presents with nasal fracture and nasal bridge laceration secondary to accidentally striking himself with the claw of a hammer. Patient has no focal neurological deficits on my exam. Discussed options of Dermabond versus sutures to repair laceration to the nasal bridge; patient prefers Dermabond with Steri-Strips. Tolerated procedure well. Strict return precautions given. Patient verbalizes understanding and agrees with plan of care.  Clinical Course      ____________________________________________   FINAL CLINICAL IMPRESSION(S) / ED DIAGNOSES  Final diagnoses:  Facial laceration, initial encounter  Open fracture of nasal bone, initial encounter      NEW MEDICATIONS STARTED DURING THIS VISIT:  New Prescriptions   CEPHALEXIN (KEFLEX) 500 MG CAPSULE    Take 1 capsule (500 mg total) by mouth 3 (three) times daily.   IBUPROFEN (ADVIL,MOTRIN) 800 MG TABLET    Take 1 tablet (800 mg total) by mouth every 8 (eight) hours as needed for moderate pain.   OXYCODONE-ACETAMINOPHEN (ROXICET) 5-325 MG TABLET    Take 1 tablet by mouth every 4 (four) hours as needed for severe pain.     Note:  This document was prepared using  Dragon [employer] and may include unintentional dictation errors.    Irean Hong, MD 05/03/16 [phone]

## 2016-05-03 NOTE — Discharge Instructions (Signed)
1. Take antibiotic as prescribed (Keflex 500 mg 3 times daily 7 days). 2. You may take pain medicines as needed (Motrin/Percocet #15). 3. Apply ice to affected area several times daily to reduce swelling. 4. Tape will fall off in about a week; you may remove after that time if tape is still attached. 5. Return to the ER for worsening symptoms, persistent vomiting, lethargy

## 2016-05-03 NOTE — ED Notes (Signed)
ED Provider at bedside. 

## 2016-05-03 NOTE — ED Triage Notes (Signed)
Patient ambulatory to triage with steady gait, without difficulty or distress noted, brought in by EMS; reports hit self in bridge of nose with hammer; small laceration noted with no active bleeding; denies LOC/HA/dizziness

## 2016-05-03 NOTE — ED Notes (Signed)
Patient asked to clear his nose and throat and provide sputum sample to see if patient was bleeding into his esophagus, the sputum was clear.

## 2016-05-03 NOTE — ED Notes (Signed)
EDP to use 1/4" steri-strips and derma-bond on patient.

## 2016-05-08 ENCOUNTER — Encounter: Payer: Self-pay | Admitting: Emergency Medicine

## 2016-05-08 ENCOUNTER — Emergency Department
Admission: EM | Admit: 2016-05-08 | Discharge: 2016-05-08 | Disposition: A | Discharge status: Home / Self Care | Payer: Medicaid Other | Attending: Emergency Medicine | Admitting: Emergency Medicine

## 2016-05-08 DIAGNOSIS — W270XXA Contact with workbench tool, initial encounter: Secondary | ICD-10-CM | POA: Diagnosis not present

## 2016-05-08 DIAGNOSIS — Y990 Civilian activity done for income or pay: Secondary | ICD-10-CM | POA: Insufficient documentation

## 2016-05-08 DIAGNOSIS — Y9389 Activity, other specified: Secondary | ICD-10-CM | POA: Insufficient documentation

## 2016-05-08 DIAGNOSIS — F17200 Nicotine dependence, unspecified, uncomplicated: Secondary | ICD-10-CM | POA: Diagnosis not present

## 2016-05-08 DIAGNOSIS — S0181XA Laceration without foreign body of other part of head, initial encounter: Secondary | ICD-10-CM | POA: Insufficient documentation

## 2016-05-08 DIAGNOSIS — Y929 Unspecified place or not applicable: Secondary | ICD-10-CM | POA: Insufficient documentation

## 2016-05-08 MED ORDER — LIDOCAINE-EPINEPHRINE-TETRACAINE (LET) SOLUTION
3.0000 mL | Freq: Once | NASAL | Status: AC
  Administered 2016-05-08: 3 mL via TOPICAL
  Filled 2016-05-08: qty 3

## 2016-05-08 NOTE — ED Provider Notes (Signed)
[facility] Emergency Department Provider Note  ____________________________________________  Time seen: Approximately 3:52 PM  I have reviewed the triage vital signs and the nursing notes.   HISTORY  Chief Complaint Wound Check    HPI Philip Owen is a 41 y.o. male presenting to the emergency department with a laceration localized to the skin overlying the midline nasal bridge. Patient states that he sustained laceration last Monday while hammering. Patient works as a Education administrator. Patient states that the posterior aspect of the hammer struck his nose. Patient sought care at the emergency department at [facility] on 05/03/2016. Steri-Strips were applied in the emergency department and Keflex was prescribed. Patient states that he has been taking his antibiotic as directed. However, integrity laceration repair has been compromised. He states that he noticed laceration "gaping open" after showering today. Patient denies new instances of trauma. Patient denies fever, erythema, or streaking. No other alleviating measures have been attempted.    Past Medical History:  Diagnosis Date  . Acid reflux   . Anxiety   . GERD (gastroesophageal reflux disease)     There are no active problems to display for this patient.   History reviewed. No pertinent surgical history.  Prior to Admission medications   Medication Sig Start Date End Date Taking? Authorizing Provider  albuterol (PROVENTIL HFA;VENTOLIN HFA) 108 (90 Base) MCG/ACT inhaler Inhale 2 puffs into the lungs every 6 (six) hours as needed for wheezing or shortness of breath. 04/06/16   Willy Eddy, MD  cephALEXin (KEFLEX) 500 MG capsule Take 1 capsule (500 mg total) by mouth 3 (three) times daily. 05/03/16   Irean Hong, MD  fluticasone (FLONASE) 50 MCG/ACT nasal spray Place 1 spray into both nostrils daily. 04/06/16 04/06/17  Willy Eddy, MD  HYDROcodone-acetaminophen (NORCO) 5-325 MG tablet Take 1-2  tablets by mouth every 4 (four) hours as needed for moderate pain. 06/10/15   Charmayne Sheer Beers, PA-C  ibuprofen (ADVIL,MOTRIN) 800 MG tablet Take 1 tablet (800 mg total) by mouth every 8 (eight) hours as needed for moderate pain. 05/03/16   Irean Hong, MD  oxyCODONE-acetaminophen (ROXICET) 5-325 MG tablet Take 1 tablet by mouth every 4 (four) hours as needed for severe pain. 05/03/16   Irean Hong, MD  phenazopyridine (PYRIDIUM) 100 MG tablet Take 1 tablet (100 mg total) by mouth 3 (three) times daily as needed for pain. 08/16/15   Tommi Rumps, PA-C  sulfamethoxazole-trimethoprim (BACTRIM DS,SEPTRA DS) 800-160 MG tablet Take 1 tablet by mouth 2 (two) times daily. 08/16/15   Tommi Rumps, PA-C    Allergies Tramadol  No family history on file.  Social History Social History  Substance Use Topics  . Smoking status: Current Every Day Smoker    Packs/day: 0.50  . Smokeless tobacco: Never Used  . Alcohol use No     Review of Systems  Constitutional: No fever/chills Eyes: No visual changes. No discharge ENT: Patient has nasal fracture and nasal bridge laceration.  Cardiovascular: no chest pain. Respiratory: no cough. No SOB. Gastrointestinal: No nausea, no vomiting.   Skin: Patient has nasal bridge laceration.  Neurological: Negative for headaches, focal weakness or numbness. 10-point ROS otherwise negative.  ____________________________________________   PHYSICAL EXAM:  VITAL SIGNS: ED Triage Vitals  Enc Vitals Group     BP 05/08/16 1447 (!) 160/84     Pulse Rate 05/08/16 1447 75     Resp 05/08/16 1447 20     Temp 05/08/16 1447 98.4 F (36.9  C)     Temp Source 05/08/16 1447 Oral     SpO2 05/08/16 1447 100 %     Weight 05/08/16 1448 155 lb (70.3 kg)     Height 05/08/16 1448 5\' 8"  (1.727 m)     Head Circumference --      Peak Flow --      Pain Score 05/08/16 1456 6     Pain Loc --      Pain Edu? --      Excl. in GC? --      Constitutional: Alert and oriented. Well  appearing and in no acute distress. Eyes: Conjunctivae are normal. PERRL. EOMI. Head: Atraumatic. ENT:      Nose: Patient has tenderness to palpation at nasal bridge. Nasal septum is midline. Skin overlying nares is without erythema.      Neck: No stridor. FROM.  Hematological/Lymphatic/Immunilogical: No cervical lymphadenopathy. Cardiovascular: Normal rate, regular rhythm. Normal S1 and S2.  Good peripheral circulation. Respiratory: Normal respiratory effort without tachypnea or retractions. Lungs CTAB. Good air entry to the bases with no decreased or absent breath sounds. Neurologic:  Normal speech and language. No gross focal neurologic deficits are appreciated.  Skin:  Patient has a 1 cm linear laceration approximately 1 mm deep. Laceration can be well approximated. No significant maceration of the skin edges. Skin edges are mildly edematous with no streaking or erythema visualized. Sloughing is visualized. Psychiatric: Mood and affect are normal. Speech and behavior are normal. Patient exhibits appropriate insight and judgement.   ____________________________________________   LABS (all labs ordered are listed, but only abnormal results are displayed)  Labs Reviewed - No data to display ____________________________________________  EKG   ____________________________________________  RADIOLOGY   No results found.  ____________________________________________    PROCEDURES  Procedure(s) performed:    Procedures LACERATION REPAIR Performed by: Orvil FeilJaclyn M Niyati Heinke Authorized by: Orvil FeilJaclyn M Teniqua Marron Consent: Verbal consent obtained. Risks and benefits: risks, benefits and alternatives were discussed Consent given by: patient Patient identity confirmed: provided demographic data Prepped and Draped in normal sterile fashion Wound explored  Laceration Location: Nasal bridge, midline  Laceration Length: 1 cm  No Foreign Bodies seen or palpated  Anesthesia: superficial LET    Anesthetic total: 3 ml  Irrigation method: syringe Amount of cleaning: standard  Skin closure: 5-0 Ethilon   Number of sutures: 2  Technique: Simple Interrupted.  Patient tolerance: Patient tolerated the procedure well with no immediate complications.    Medications  lidocaine-EPINEPHrine-tetracaine (LET) solution (3 mLs Topical Given 05/08/16 1547)     ____________________________________________   INITIAL IMPRESSION / ASSESSMENT AND PLAN / ED COURSE  Pertinent labs & imaging results that were available during my care of the patient were reviewed by me and considered in my medical decision making (see chart for details).  Review of the [place] CSRS was performed in accordance of the [facility] prior to dispensing any controlled drugs.  Clinical Course     Assessment and Plan:  Facial Laceration Patient presents to the emergency department for laceration repair of the skin overlying the midline nasal bridge. Patient tolerated the procedure well. Patient was advised to follow-up with otolaryngology in 3 days to have nasal fracture and laceration repair assessed. Patient was advised to finish course of Keflex prescribed on 05/03/2016 by Dr. Dolores FrameSung. All patient questions were answered.    ____________________________________________  FINAL CLINICAL IMPRESSION(S) / ED DIAGNOSES  Final diagnoses:  Facial laceration, initial encounter      NEW MEDICATIONS STARTED DURING THIS VISIT:  Discharge Medication List as of 05/08/2016  4:26 PM          This chart was dictated using voice recognition software/Dragon. Despite best efforts to proofread, errors can occur which can change the meaning. Any change was purely unintentional.    Orvil Feil, PA-C 05/08/16 1646    Sharyn Creamer, MD 05/19/16 [phone]

## 2016-05-08 NOTE — ED Triage Notes (Signed)
Seen through ED Monday night.  Patient had steri steristrips placed.  Today looked under the steristrips and wound not closed.  Patient is concerned about healing and possibility of a scar.

## 2016-05-08 NOTE — ED Notes (Signed)
See triage note  States he was seen about 5 days and had steri strips placed to bridge of nose  Strips remain in place  But area is still open

## 2016-05-11 ENCOUNTER — Emergency Department
Admission: EM | Admit: 2016-05-11 | Discharge: 2016-05-11 | Disposition: A | Discharge status: Home / Self Care | Payer: Medicaid Other | Attending: Emergency Medicine | Admitting: Emergency Medicine

## 2016-05-11 ENCOUNTER — Encounter: Payer: Self-pay | Admitting: Emergency Medicine

## 2016-05-11 DIAGNOSIS — Z4802 Encounter for removal of sutures: Secondary | ICD-10-CM | POA: Diagnosis present

## 2016-05-11 DIAGNOSIS — S0121XD Laceration without foreign body of nose, subsequent encounter: Secondary | ICD-10-CM | POA: Insufficient documentation

## 2016-05-11 DIAGNOSIS — F17200 Nicotine dependence, unspecified, uncomplicated: Secondary | ICD-10-CM | POA: Diagnosis not present

## 2016-05-11 DIAGNOSIS — Z79899 Other long term (current) drug therapy: Secondary | ICD-10-CM | POA: Insufficient documentation

## 2016-05-11 DIAGNOSIS — X58XXXD Exposure to other specified factors, subsequent encounter: Secondary | ICD-10-CM | POA: Insufficient documentation

## 2016-05-11 NOTE — ED Triage Notes (Signed)
Sutures in bridge of nose to be removed today.  Healing well

## 2016-05-11 NOTE — ED Provider Notes (Signed)
[facility] Emergency Department Provider Note   ____________________________________________   First MD Initiated Contact with Patient 05/11/16 1754     (approximate)  I have reviewed the triage vital signs and the nursing notes.   HISTORY  Chief Complaint Suture / Staple Removal    HPI Philip Owen is a 41 y.o. male patient here for suture removals status post laceration 05/03/2016. Patient denies any complaint at this time.Patient also has a healing nasal fracture.   Past Medical History:  Diagnosis Date  . Acid reflux   . Anxiety   . GERD (gastroesophageal reflux disease)     There are no active problems to display for this patient.   History reviewed. No pertinent surgical history.  Prior to Admission medications   Medication Sig Start Date End Date Taking? Authorizing Provider  albuterol (PROVENTIL HFA;VENTOLIN HFA) 108 (90 Base) MCG/ACT inhaler Inhale 2 puffs into the lungs every 6 (six) hours as needed for wheezing or shortness of breath. 04/06/16   Willy EddyPatrick Robinson, MD  cephALEXin (KEFLEX) 500 MG capsule Take 1 capsule (500 mg total) by mouth 3 (three) times daily. 05/03/16   Irean HongJade J Sung, MD  fluticasone (FLONASE) 50 MCG/ACT nasal spray Place 1 spray into both nostrils daily. 04/06/16 04/06/17  Willy EddyPatrick Robinson, MD  HYDROcodone-acetaminophen (NORCO) 5-325 MG tablet Take 1-2 tablets by mouth every 4 (four) hours as needed for moderate pain. 06/10/15   Charmayne Sheerharles M Beers, PA-C  ibuprofen (ADVIL,MOTRIN) 800 MG tablet Take 1 tablet (800 mg total) by mouth every 8 (eight) hours as needed for moderate pain. 05/03/16   Irean HongJade J Sung, MD  oxyCODONE-acetaminophen (ROXICET) 5-325 MG tablet Take 1 tablet by mouth every 4 (four) hours as needed for severe pain. 05/03/16   Irean HongJade J Sung, MD  phenazopyridine (PYRIDIUM) 100 MG tablet Take 1 tablet (100 mg total) by mouth 3 (three) times daily as needed for pain. 08/16/15   Tommi Rumpshonda L Summers, PA-C    sulfamethoxazole-trimethoprim (BACTRIM DS,SEPTRA DS) 800-160 MG tablet Take 1 tablet by mouth 2 (two) times daily. 08/16/15   Tommi Rumpshonda L Summers, PA-C    Allergies Tramadol  No family history on file.  Social History Social History  Substance Use Topics  . Smoking status: Current Every Day Smoker    Packs/day: 0.50  . Smokeless tobacco: Never Used  . Alcohol use No    Review of Systems Constitutional: No fever/chills Eyes: No visual changes. ENT: No sore throat. Cardiovascular: Denies chest pain. Respiratory: Denies shortness of breath. Gastrointestinal: No abdominal pain.  No nausea, no vomiting.  No diarrhea.  No constipation. Genitourinary: Negative for dysuria. Musculoskeletal:Status post 8 the nasal fracture Skin: Negative for rash. Healing nasal laceration Neurological: Negative for headaches, focal weakness or numbness. {**Psychiatric: Endocrine: Hematological/Lymphatic: Allergic/Immunilogical: The medication list 10-point ROS otherwise negative.  ____________________________________________   PHYSICAL EXAM:  VITAL SIGNS: ED Triage Vitals  Enc Vitals Group     BP 05/11/16 1739 (!) 164/103     Pulse Rate 05/11/16 1739 98     Resp 05/11/16 1739 18     Temp 05/11/16 1739 98 F (36.7 C)     Temp Source 05/11/16 1739 Oral     SpO2 05/11/16 1739 98 %     Weight 05/11/16 1740 158 lb (71.7 kg)     Height 05/11/16 1740 5\' 8"  (1.727 m)     Head Circumference --      Peak Flow --      Pain Score 05/11/16 1740  0     Pain Loc --      Pain Edu? --      Excl. in GC? --     Constitutional: Alert and oriented. Well appearing and in no acute distress. Eyes: Conjunctivae are normal. PERRL. EOMI. Head: Atraumatic. Nose: No congestion/rhinnorhea. Mouth/Throat: Mucous membranes are moist.  Oropharynx non-erythematous. Neck: No stridor.  No cervical spine tenderness to palpation. Hematological/Lymphatic/Immunilogical: No cervical lymphadenopathy. Cardiovascular:  Normal rate, regular rhythm. Grossly normal heart sounds.  Good peripheral circulation. Elevated blood pressure Respiratory: Normal respiratory effort.  No retractions. Lungs CTAB. Gastrointestinal: Soft and nontender. No distention. No abdominal bruits. No CVA tenderness. Musculoskeletal: No lower extremity tenderness nor edema.  No joint effusions. Neurologic:  Normal speech and language. No gross focal neurologic deficits are appreciated. No gait instability. Skin:  Skin is warm, dry and intact. No rash noted. Healing nasal laceration was 2 sutures intact. Psychiatric: Mood and affect are normal. Speech and behavior are normal.  ____________________________________________   LABS (all labs ordered are listed, but only abnormal results are displayed)  Labs Reviewed - No data to display ____________________________________________  EKG   ____________________________________________  RADIOLOGY   ____________________________________________   PROCEDURES  Procedure(s) performed: None  Procedures  Critical Care performed: No  ____________________________________________   INITIAL IMPRESSION / ASSESSMENT AND PLAN / ED COURSE  Pertinent labs & imaging results that were available during my care of the patient were reviewed by me and considered in my medical decision making (see chart for details).  Healing nasal laceration. Patient given discharge care instructions. Patient advised return by ER for wound reopens.  Clinical Course   2 sutures were removed prior to departure.   ____________________________________________   FINAL CLINICAL IMPRESSION(S) / ED DIAGNOSES  Final diagnoses:  Visit for suture removal      NEW MEDICATIONS STARTED DURING THIS VISIT:  New Prescriptions   No medications on file     Note:  This document was prepared using Dragon voice recognition software and may include unintentional dictation errors.    Joni Reining,  PA-C 05/11/16 1802    Myrna Blazer, MD 05/11/16 [phone]

## 2017-02-15 ENCOUNTER — Emergency Department
Admission: EM | Admit: 2017-02-15 | Discharge: 2017-02-15 | Disposition: A | Discharge status: Home / Self Care | Payer: Self-pay | Attending: Emergency Medicine | Admitting: Emergency Medicine

## 2017-02-15 ENCOUNTER — Emergency Department: Payer: Self-pay

## 2017-02-15 ENCOUNTER — Encounter: Payer: Self-pay | Admitting: Emergency Medicine

## 2017-02-15 DIAGNOSIS — Z79899 Other long term (current) drug therapy: Secondary | ICD-10-CM | POA: Insufficient documentation

## 2017-02-15 DIAGNOSIS — R0789 Other chest pain: Secondary | ICD-10-CM | POA: Insufficient documentation

## 2017-02-15 DIAGNOSIS — F17200 Nicotine dependence, unspecified, uncomplicated: Secondary | ICD-10-CM | POA: Insufficient documentation

## 2017-02-15 MED ORDER — KETOROLAC TROMETHAMINE 10 MG PO TABS: 10.0000 mg | ORAL_TABLET | Freq: Four times a day (QID) | ORAL | 0 refills | Status: AC | PRN

## 2017-02-15 MED ORDER — KETOROLAC TROMETHAMINE 30 MG/ML IJ SOLN
30.0000 mg | Freq: Once | INTRAMUSCULAR | Status: AC
  Administered 2017-02-15: 30 mg via INTRAMUSCULAR
  Filled 2017-02-15: qty 1

## 2017-02-15 NOTE — ED Provider Notes (Signed)
[facility] Emergency Department Provider Note  ____________________________________________  Time seen: Approximately 7:16 PM  I have reviewed the triage vital signs and the nursing notes.   HISTORY  Chief Complaint Chest Pain    HPI Philip Owen is a 41 y.o. male presents to the emergency department with 7/10 left-sided rib pain after a ladder fell on patient at work. Patient states that is difficult to take a deep breath. He denies cough or anterior chest wall discomfort before workplace incident. Patient denies chest pain, chest tightness, shortness of breath, nausea, vomiting and abdominal pain. No alleviating measures have been attempted.   Past Medical History:  Diagnosis Date  . Acid reflux   . Anxiety   . GERD (gastroesophageal reflux disease)     There are no active problems to display for this patient.   History reviewed. No pertinent surgical history.  Prior to Admission medications   Medication Sig Start Date End Date Taking? Authorizing Provider  albuterol (PROVENTIL HFA;VENTOLIN HFA) 108 (90 Base) MCG/ACT inhaler Inhale 2 puffs into the lungs every 6 (six) hours as needed for wheezing or shortness of breath. 04/06/16   Willy Eddy, MD  cephALEXin (KEFLEX) 500 MG capsule Take 1 capsule (500 mg total) by mouth 3 (three) times daily. 05/03/16   Irean Hong, MD  fluticasone (FLONASE) 50 MCG/ACT nasal spray Place 1 spray into both nostrils daily. 04/06/16 04/06/17  Willy Eddy, MD  HYDROcodone-acetaminophen (NORCO) 5-325 MG tablet Take 1-2 tablets by mouth every 4 (four) hours as needed for moderate pain. 06/10/15   Beers, Charmayne Sheer, PA-C  ibuprofen (ADVIL,MOTRIN) 800 MG tablet Take 1 tablet (800 mg total) by mouth every 8 (eight) hours as needed for moderate pain. 05/03/16   Irean Hong, MD  ketorolac (TORADOL) 10 MG tablet Take 1 tablet (10 mg total) by mouth every 6 (six) hours as needed. 02/15/17 02/20/17  Orvil Feil, PA-C   oxyCODONE-acetaminophen (ROXICET) 5-325 MG tablet Take 1 tablet by mouth every 4 (four) hours as needed for severe pain. 05/03/16   Irean Hong, MD  phenazopyridine (PYRIDIUM) 100 MG tablet Take 1 tablet (100 mg total) by mouth 3 (three) times daily as needed for pain. 08/16/15   Tommi Rumps, PA-C  sulfamethoxazole-trimethoprim (BACTRIM DS,SEPTRA DS) 800-160 MG tablet Take 1 tablet by mouth 2 (two) times daily. 08/16/15   Tommi Rumps, PA-C    Allergies Tramadol  No family history on file.  Social History Social History  Substance Use Topics  . Smoking status: Current Every Day Smoker    Packs/day: 0.50  . Smokeless tobacco: Never Used  . Alcohol use No     Review of Systems  Constitutional: No fever/chills Eyes: No visual changes. No discharge ENT: No upper respiratory complaints. Cardiovascular: no chest pain. Respiratory: no cough. No SOB. Gastrointestinal: No abdominal pain.  No nausea, no vomiting.  No diarrhea.  No constipation. Musculoskeletal: Patient has left rib pain.  Skin: Negative for rash, abrasions, lacerations, ecchymosis. Neurological: Negative for headaches, focal weakness or numbness.   ____________________________________________   PHYSICAL EXAM:  VITAL SIGNS: ED Triage Vitals  Enc Vitals Group     BP 02/15/17 1838 (!) 152/90     Pulse Rate 02/15/17 1838 80     Resp 02/15/17 1838 20     Temp 02/15/17 1838 98.2 F (36.8 C)     Temp Source 02/15/17 1838 Oral     SpO2 02/15/17 1838 98 %  Weight 02/15/17 1839 147 lb (66.7 kg)     Height 02/15/17 1839 5\' 8"  (1.727 m)     Head Circumference --      Peak Flow --      Pain Score 02/15/17 1838 8     Pain Loc --      Pain Edu? --      Excl. in GC? --      Constitutional: Alert and oriented. Well appearing and in no acute distress. Eyes: Conjunctivae are normal. PERRL. EOMI. Head: Atraumatic. Cardiovascular: Normal rate, regular rhythm. Normal S1 and S2.  Good peripheral  circulation. Respiratory: Normal respiratory effort without tachypnea or retractions. Lungs CTAB. Good air entry to the bases with no decreased or absent breath sounds. Musculoskeletal: Full range of motion to all extremities. No gross deformities appreciated. Patient has pain with palpation over left anterior ribs. Neurologic:  Normal speech and language. No gross focal neurologic deficits are appreciated.  Skin:  Skin is warm, dry and intact. No rash noted. Psychiatric: Mood and affect are normal. Speech and behavior are normal. Patient exhibits appropriate insight and judgement.   ____________________________________________   LABS (all labs ordered are listed, but only abnormal results are displayed)  Labs Reviewed - No data to display ____________________________________________  EKG   ____________________________________________  RADIOLOGY Geraldo Pitter, personally viewed and evaluated these images (plain radiographs) as part of my medical decision making, as well as reviewing the written report by the radiologist.    Dg Ribs Unilateral W/chest Left  Result Date: 02/15/2017 CLINICAL DATA:  Left-sided chest pain following blunt trauma EXAM: LEFT RIBS AND CHEST - 3+ VIEW COMPARISON:  04/06/2016 FINDINGS: Cardiac shadow is within normal limits. The lungs are well aerated bilaterally. No focal infiltrate, effusion or pneumothorax is seen. Multiple old left rib fractures are seen. No acute rib fracture is noted. IMPRESSION: Old healed left rib fractures are noted.  No acute abnormality seen. Electronically Signed   By: Alcide Clever M.D.   On: 02/15/2017 19:30    ____________________________________________    PROCEDURES  Procedure(s) performed:    Procedures    Medications  ketorolac (TORADOL) 30 MG/ML injection 30 mg (30 mg Intramuscular Given 02/15/17 1941)     ____________________________________________   INITIAL IMPRESSION / ASSESSMENT AND PLAN / ED  COURSE  Pertinent labs & imaging results that were available during my care of the patient were reviewed by me and considered in my medical decision making (see chart for details).  Review of the [place] CSRS was performed in accordance of the [facility] prior to dispensing any controlled drugs.     Assessment and Plan:  Left chest wall pain Patient presents to the emergency department with anterior chest wall pain after a ladder fell on patient at work. Differential diagnosis included fracture versus pneumothorax versus soft tissue contusion. X ray xamination conducted in the emergency department revealed no acute abnormality. An injection of Toradol was given in the emergency department after patient denied a history of GI bleeds or anti-inflammatory intolerance. Patient was discharged with oral Toradol. Vital signs are reassuring prior to discharge. All patient questions were answered.  ____________________________________________  FINAL CLINICAL IMPRESSION(S) / ED DIAGNOSES  Final diagnoses:  Chest wall pain      NEW MEDICATIONS STARTED DURING THIS VISIT:  New Prescriptions   KETOROLAC (TORADOL) 10 MG TABLET    Take 1 tablet (10 mg total) by mouth every 6 (six) hours as needed.        This chart  was dictated using voice recognition software/Dragon. Despite best efforts to proofread, errors can occur which can change the meaning. Any change was purely unintentional.    Orvil FeilWoods, Jaclyn M, PA-C 02/15/17 1946    Dionne BucySiadecki, Sebastian, MD 02/15/17 2333

## 2017-02-15 NOTE — ED Notes (Signed)
Patient denies wanting to file workman's compensation.

## 2017-02-15 NOTE — ED Triage Notes (Signed)
Patient presents to the ED with upper left chest/rib pain.  Patient states he was trying to put a ladder up at work and it hit him in the chest.  Patient reports pain with breathing.  Patient is in no obvious distress at this time.  Ambulatory without difficulty to assessment room.  Patient's chest movement is symmetrical at this time.

## 2017-04-18 ENCOUNTER — Emergency Department
Admission: EM | Admit: 2017-04-18 | Discharge: 2017-04-18 | Disposition: A | Discharge status: Home / Self Care | Payer: Self-pay | Attending: Emergency Medicine | Admitting: Emergency Medicine

## 2017-04-18 ENCOUNTER — Encounter: Payer: Self-pay | Admitting: Emergency Medicine

## 2017-04-18 ENCOUNTER — Emergency Department: Payer: Self-pay

## 2017-04-18 DIAGNOSIS — F17210 Nicotine dependence, cigarettes, uncomplicated: Secondary | ICD-10-CM | POA: Insufficient documentation

## 2017-04-18 DIAGNOSIS — Y929 Unspecified place or not applicable: Secondary | ICD-10-CM | POA: Insufficient documentation

## 2017-04-18 DIAGNOSIS — S01412A Laceration without foreign body of left cheek and temporomandibular area, initial encounter: Secondary | ICD-10-CM | POA: Insufficient documentation

## 2017-04-18 DIAGNOSIS — Y999 Unspecified external cause status: Secondary | ICD-10-CM | POA: Insufficient documentation

## 2017-04-18 DIAGNOSIS — Y939 Activity, unspecified: Secondary | ICD-10-CM | POA: Insufficient documentation

## 2017-04-18 DIAGNOSIS — S0181XA Laceration without foreign body of other part of head, initial encounter: Secondary | ICD-10-CM

## 2017-04-18 DIAGNOSIS — S0990XA Unspecified injury of head, initial encounter: Secondary | ICD-10-CM

## 2017-04-18 MED ORDER — OXYCODONE-ACETAMINOPHEN 5-325 MG PO TABS
1.0000 | ORAL_TABLET | Freq: Once | ORAL | Status: AC
  Administered 2017-04-18: 1 via ORAL
  Filled 2017-04-18: qty 1

## 2017-04-18 MED ORDER — AMOXICILLIN 500 MG PO TABS: 500.0000 mg | ORAL_TABLET | Freq: Two times a day (BID) | ORAL | 0 refills | Status: DC

## 2017-04-18 MED ORDER — OXYCODONE-ACETAMINOPHEN 5-325 MG PO TABS: 1.0000 | ORAL_TABLET | Freq: Four times a day (QID) | ORAL | 0 refills | Status: DC | PRN

## 2017-04-18 MED ORDER — CHLORHEXIDINE GLUCONATE 0.12 % MT SOLN: 15.0000 mL | Freq: Two times a day (BID) | OROMUCOSAL | 0 refills | Status: AC

## 2017-04-18 MED ORDER — LIDOCAINE-EPINEPHRINE (PF) 2 %-1:200000 IJ SOLN
20.0000 mL | Freq: Once | INTRAMUSCULAR | Status: AC
  Administered 2017-04-18: 20 mL
  Filled 2017-04-18: qty 20

## 2017-04-18 NOTE — ED Notes (Signed)
Per EMS [facility] aware and charges filed, suspect in custody
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
## 2017-04-18 NOTE — ED Notes (Signed)
ED Provider at bedside. 

## 2017-04-18 NOTE — Discharge Instructions (Signed)
Your CT scans show a broken nose with a collection of blood in your left maxillary sinus, but no other concerning injuries. Take amoxicillin to protect against infection in the sinus. Use Peridex oral rinse to prevent infection of the laceration wound of your mouth. Follow up with ENT for further assessment of your broken nose.
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
## 2017-04-18 NOTE — ED Notes (Signed)
[facility]
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
## 2017-04-18 NOTE — ED Notes (Signed)
Pt in NAD at time of d/c, [employer] and family at bedside. Pt gives consent to go over discharge instructions in front of visitors and [employer]. PT ambulatory, A&Ox4, NAD at d/c. Pt refuses d/c VS

## 2017-04-18 NOTE — ED Notes (Signed)
Pt has signed paper copy form of d/c signature

## 2017-04-18 NOTE — ED Notes (Signed)
[employer] at bedside 

## 2017-05-03 ENCOUNTER — Emergency Department: Payer: Self-pay

## 2017-05-03 ENCOUNTER — Other Ambulatory Visit: Payer: Self-pay

## 2017-05-03 ENCOUNTER — Emergency Department
Admission: EM | Admit: 2017-05-03 | Discharge: 2017-05-03 | Disposition: A | Discharge status: Home / Self Care | Payer: Self-pay | Attending: Emergency Medicine | Admitting: Emergency Medicine

## 2017-05-03 DIAGNOSIS — J189 Pneumonia, unspecified organism: Secondary | ICD-10-CM

## 2017-05-03 DIAGNOSIS — Z79899 Other long term (current) drug therapy: Secondary | ICD-10-CM | POA: Insufficient documentation

## 2017-05-03 DIAGNOSIS — J181 Lobar pneumonia, unspecified organism: Secondary | ICD-10-CM | POA: Insufficient documentation

## 2017-05-03 DIAGNOSIS — F17210 Nicotine dependence, cigarettes, uncomplicated: Secondary | ICD-10-CM | POA: Insufficient documentation

## 2017-05-03 LAB — INFLUENZA PANEL BY PCR (TYPE A & B)
INFLBPCR: NEGATIVE
Influenza A By PCR: NEGATIVE

## 2017-05-03 MED ORDER — ACETAMINOPHEN 160 MG/5ML PO SOLN
1000.0000 mg | Freq: Once | ORAL | Status: DC
  Filled 2017-05-03: qty 40

## 2017-05-03 MED ORDER — AZITHROMYCIN 250 MG PO TABS: ORAL_TABLET | ORAL | 0 refills | Status: AC

## 2017-05-03 MED ORDER — PSEUDOEPH-BROMPHEN-DM 30-2-10 MG/5ML PO SYRP: 5.0000 mL | ORAL_SOLUTION | Freq: Four times a day (QID) | ORAL | 0 refills | Status: DC | PRN

## 2017-05-03 MED ORDER — IBUPROFEN 800 MG PO TABS: 800.0000 mg | ORAL_TABLET | Freq: Three times a day (TID) | ORAL | 0 refills | Status: DC | PRN

## 2017-05-03 MED ORDER — ACETAMINOPHEN 325 MG PO TABS: 650.0000 mg | ORAL_TABLET | Freq: Once | ORAL | Status: DC | PRN

## 2017-05-03 MED ORDER — ONDANSETRON HCL 8 MG PO TABS: 8.0000 mg | ORAL_TABLET | Freq: Three times a day (TID) | ORAL | 0 refills | Status: DC | PRN

## 2017-05-03 MED ORDER — ACETAMINOPHEN 500 MG PO TABS
ORAL_TABLET | ORAL | Status: AC
  Administered 2017-05-03: 1000 mg via ORAL
  Filled 2017-05-03: qty 2

## 2017-05-03 MED ORDER — ACETAMINOPHEN 500 MG PO TABS
1000.0000 mg | ORAL_TABLET | Freq: Once | ORAL | Status: AC
  Administered 2017-05-03: 1000 mg via ORAL

## 2017-05-03 NOTE — ED Triage Notes (Signed)
Mask applied on arrival to E.D.

## 2017-05-03 NOTE — ED Triage Notes (Signed)
Pt reports subjective fevers for 2-3 days, c/o generalized body aches.  Pt reports decreased appetite and nausea.  Denies any vomiting or diarrhea.   Pt states he took Tylenol yesterday but ran out. Pt also coughing in triage and is non-productive.

## 2017-05-03 NOTE — ED Provider Notes (Signed)
[facility] Emergency Department Provider Note   ____________________________________________   First MD Initiated Contact with Patient 05/03/17 [phone]     (approximate)  I have reviewed the triage vital signs and the nursing notes.   HISTORY  Chief Complaint Fever and Generalized Body Aches    HPI Philip Owen is a 42 y.o. male patient complain of generalized body ache, nasal congestion, cough, and fever for 2-3 days. Patient stated decreased appetite and nausea but denies vomiting diarrhea. Patient has not taken a flu shot this season.  Past Medical History:  Diagnosis Date  . Acid reflux   . Anxiety   . GERD (gastroesophageal reflux disease)     There are no active problems to display for this patient.   No past surgical history on file.  Prior to Admission medications   Medication Sig Start Date End Date Taking? Authorizing Provider  albuterol (PROVENTIL HFA;VENTOLIN HFA) 108 (90 Base) MCG/ACT inhaler Inhale 2 puffs into the lungs every 6 (six) hours as needed for wheezing or shortness of breath. Patient not taking: Reported on 04/18/2017 04/06/16   Willy Eddy, MD  amoxicillin (AMOXIL) 500 MG tablet Take 1 tablet (500 mg total) by mouth 2 (two) times daily. 04/18/17   Sharman Cheek, MD  azithromycin (ZITHROMAX Z-PAK) 250 MG tablet Take 2 tablets (500 mg) on  Day 1,  followed by 1 tablet (250 mg) once daily on Days 2 through 5. 05/03/17 05/08/17  Joni Reining, PA-C  brompheniramine-pseudoephedrine-DM 30-2-10 MG/5ML syrup Take 5 mLs by mouth 4 (four) times daily as needed. 05/03/17   Joni Reining, PA-C  cephALEXin (KEFLEX) 500 MG capsule Take 1 capsule (500 mg total) by mouth 3 (three) times daily. Patient not taking: Reported on 04/18/2017 05/03/16   Irean Hong, MD  fluticasone [facility]) 50 MCG/ACT nasal spray Place 1 spray into both nostrils daily. 04/06/16 04/06/17  Willy Eddy, MD  HYDROcodone-acetaminophen (NORCO) 5-325 MG  tablet Take 1-2 tablets by mouth every 4 (four) hours as needed for moderate pain. Patient not taking: Reported on 04/18/2017 06/10/15   Evangeline Dakin, PA-C  ibuprofen (ADVIL,MOTRIN) 800 MG tablet Take 1 tablet (800 mg total) by mouth every 8 (eight) hours as needed for moderate pain. Patient not taking: Reported on 04/18/2017 05/03/16   Irean Hong, MD  ibuprofen (ADVIL,MOTRIN) 800 MG tablet Take 1 tablet (800 mg total) by mouth every 8 (eight) hours as needed for moderate pain. 05/03/17   Joni Reining, PA-C  ondansetron (ZOFRAN) 8 MG tablet Take 1 tablet (8 mg total) by mouth every 8 (eight) hours as needed for nausea or vomiting. 05/03/17   Joni Reining, PA-C  oxyCODONE-acetaminophen (ROXICET) 5-325 MG tablet Take 1 tablet by mouth every 6 (six) hours as needed for severe pain. 04/18/17   Sharman Cheek, MD  phenazopyridine (PYRIDIUM) 100 MG tablet Take 1 tablet (100 mg total) by mouth 3 (three) times daily as needed for pain. Patient not taking: Reported on 04/18/2017 08/16/15   Tommi Rumps, PA-C  sulfamethoxazole-trimethoprim (BACTRIM DS,SEPTRA DS) 800-160 MG tablet Take 1 tablet by mouth 2 (two) times daily. Patient not taking: Reported on 04/18/2017 08/16/15   Tommi Rumps, PA-C    Allergies Tramadol and Naproxen  No family history on file.  Social History Social History   Tobacco Use  . Smoking status: Current Every Day Smoker    Packs/day: 0.50  . Smokeless tobacco: Never Used  Substance Use Topics  . Alcohol  use: No  . Drug use: No    Review of Systems Constitutional: No fever/chills Eyes: No visual changes. ENT: No sore throat. Cardiovascular: Denies chest pain. Respiratory: Denies shortness of breath. Gastrointestinal: No abdominal pain.  No nausea, no vomiting.  No diarrhea.  No constipation. Genitourinary: Negative for dysuria. Musculoskeletal: Negative for back pain. Skin: Negative for rash. Neurological: Negative for headaches, focal weakness or  numbness. Psychiatric:Anxiety Allergic/Immunilogical: Tramadol and naproxen ____________________________________________   PHYSICAL EXAM:  VITAL SIGNS: ED Triage Vitals [05/03/17 0821]  Enc Vitals Group     BP 140/78     Pulse Rate (!) 101     Resp 18     Temp (!) 102 F (38.9 C)     Temp Source Oral     SpO2 100 %     Weight      Height      Head Circumference      Peak Flow      Pain Score 8     Pain Loc      Pain Edu?      Excl. in GC?    Constitutional: Alert and oriented. Well appearing and in no acute distress. Febrile Nose: Edematous nasal turbinates clear rhinorrhea Mouth/Throat: Mucous membranes are moist.  Oropharynx non-erythematous. Postnasal drainage Neck: No stridor. Cardiovascular: Normal rate, regular rhythm. Grossly normal heart sounds.  Good peripheral circulation. Respiratory: Normal respiratory effort.  No retractions. Lungs CTAB. Gastrointestinal: Soft and nontender. No distention. No abdominal bruits. No CVA tenderness. Neurologic:  Normal speech and language. No gross focal neurologic deficits are appreciated. No gait instability. Skin:  Skin is warm, dry and intact. No rash noted. Psychiatric: Mood and affect are normal. Speech and behavior are normal.  ____________________________________________   LABS (all labs ordered are listed, but only abnormal results are displayed)  Labs Reviewed  INFLUENZA PANEL BY PCR (TYPE A & B)   ____________________________________________  EKG   ____________________________________________  RADIOLOGY  Dg Chest 2 View  Result Date: 05/03/2017 CLINICAL DATA:  Fever and productive cough. EXAM: CHEST  2 VIEW COMPARISON:  Chest x-ray 02/15/2017 FINDINGS: The cardiac silhouette, mediastinal and hilar contours are within normal limits and stable. There is a infiltrate in the medial basal segment of the right lower lobe. Densities associated with the left ribs are consistent with remote healed fractures with  callus formation. No pleural effusions. IMPRESSION: Right lower lobe pneumonia. Electronically Signed   By: Rudie Meyer M.D.   On: 05/03/2017 08:53    ____________________________________________   PROCEDURES  Procedure(s) performed: None  Procedures  Critical Care performed: No  ____________________________________________   INITIAL IMPRESSION / ASSESSMENT AND PLAN / ED COURSE  As part of my medical decision making, I reviewed the following data within the electronic MEDICAL RECORD NUMBER    Patient presented with generalized body aches and URI signs and symptoms. Patient was negative for flu and chest x-ray reveal right lower lobe pneumonia. Discussed x-ray findings with patient. Patient given discharge care instruction prostate medication as directed. Patient advised follow-up with "clinic if condition persists.      ____________________________________________   FINAL CLINICAL IMPRESSION(S) / ED DIAGNOSES  Final diagnoses:  Community acquired pneumonia of right lower lobe of lung (HCC)     ED Discharge Orders        Ordered    brompheniramine-pseudoephedrine-DM 30-2-10 MG/5ML syrup  4 times daily PRN     05/03/17 0943    ibuprofen (ADVIL,MOTRIN) 800 MG tablet  Every 8 hours PRN  05/03/17 0943    ondansetron (ZOFRAN) 8 MG tablet  Every 8 hours PRN     05/03/17 0943    azithromycin (ZITHROMAX Z-PAK) 250 MG tablet     05/03/17 [id number]       Note:  This document was prepared using Dragon voice recognition software and may include unintentional dictation errors.    Joni ReiningSmith, Ronald K, PA-C 05/03/17 [id number]    Sharman CheekStafford, Phillip, MD 05/03/17 1538

## 2017-05-03 NOTE — ED Notes (Signed)
Pt walked to XR.

## 2017-05-06 ENCOUNTER — Encounter: Payer: Self-pay | Admitting: Emergency Medicine

## 2017-05-06 ENCOUNTER — Emergency Department
Admission: EM | Admit: 2017-05-06 | Discharge: 2017-05-06 | Disposition: A | Discharge status: Home / Self Care | Payer: Self-pay | Attending: Emergency Medicine | Admitting: Emergency Medicine

## 2017-05-06 DIAGNOSIS — J189 Pneumonia, unspecified organism: Secondary | ICD-10-CM | POA: Insufficient documentation

## 2017-05-06 DIAGNOSIS — F17210 Nicotine dependence, cigarettes, uncomplicated: Secondary | ICD-10-CM | POA: Insufficient documentation

## 2017-05-06 DIAGNOSIS — Z79899 Other long term (current) drug therapy: Secondary | ICD-10-CM | POA: Insufficient documentation

## 2017-05-06 DIAGNOSIS — H9201 Otalgia, right ear: Secondary | ICD-10-CM | POA: Insufficient documentation

## 2017-05-06 MED ORDER — PREDNISONE 10 MG PO TABS: ORAL_TABLET | ORAL | 0 refills | Status: DC

## 2017-05-06 NOTE — ED Triage Notes (Signed)
Pt dx with pneumonia a few days ago, given abx for it, now with right ear pain x3 days.

## 2017-05-06 NOTE — ED Triage Notes (Addendum)
FIRST NURSE NOTE-pt c/o right ear pain and difficulty hearing. 1 abx pill left for PNA and thinks still has PNA. Unlabored, ambulatory

## 2017-05-06 NOTE — Discharge Instructions (Signed)
Follow-up with your  primary care provider if any continued problems with coughing or ear pain. Finish your antibiotic as instructed. Increase fluids. Tylenol or ibuprofen as needed for ear pain.

## 2017-05-06 NOTE — ED Provider Notes (Signed)
[facility] Emergency Department Provider Note  ____________________________________________   First MD Initiated Contact with Patient 05/06/17 1328     (approximate)  I have reviewed the triage vital signs and the nursing notes.   HISTORY  Chief Complaint Otalgia   HPI Philip Owen is a 42 y.o. male is here with complaint of right ear pain. Patient has a history of pneumonia diagnosed a few days ago and he will complete the Zithromax today. Patient denies any fever at home. He is not having chills. He states that the hearing in his right ear is markedly decreased. Patient continues to smoke cigarettes. He denies any difficulty breathing or shortness of breath.   Past Medical History:  Diagnosis Date  . Acid reflux   . Anxiety   . GERD (gastroesophageal reflux disease)     There are no active problems to display for this patient.   No past surgical history on file.  Prior to Admission medications   Medication Sig Start Date End Date Taking? Authorizing Provider  albuterol (PROVENTIL HFA;VENTOLIN HFA) 108 (90 Base) MCG/ACT inhaler Inhale 2 puffs into the lungs every 6 (six) hours as needed for wheezing or shortness of breath. Patient not taking: Reported on 04/18/2017 04/06/16   Willy Eddyobinson, Patrick, MD  amoxicillin (AMOXIL) 500 MG tablet Take 1 tablet (500 mg total) by mouth 2 (two) times daily. 04/18/17   Sharman CheekStafford, Phillip, MD  azithromycin (ZITHROMAX Z-PAK) 250 MG tablet Take 2 tablets (500 mg) on  Day 1,  followed by 1 tablet (250 mg) once daily on Days 2 through 5. 05/03/17 05/08/17  Joni ReiningSmith, Ronald K, PA-C  brompheniramine-pseudoephedrine-DM 30-2-10 MG/5ML syrup Take 5 mLs by mouth 4 (four) times daily as needed. 05/03/17   Joni ReiningSmith, Ronald K, PA-C  fluticasone (FLONASE) 50 MCG/ACT nasal spray Place 1 spray into both nostrils daily. 04/06/16 04/06/17  Willy Eddyobinson, Patrick, MD  ondansetron (ZOFRAN) 8 MG tablet Take 1 tablet (8 mg total) by mouth every 8 (eight)  hours as needed for nausea or vomiting. 05/03/17   Joni ReiningSmith, Ronald K, PA-C  oxyCODONE-acetaminophen (ROXICET) 5-325 MG tablet Take 1 tablet by mouth every 6 (six) hours as needed for severe pain. 04/18/17   Sharman CheekStafford, Phillip, MD  predniSONE (DELTASONE) 10 MG tablet take 3 tablets once a day for the next 4 days. 05/06/17   Tommi RumpsSummers, Takesha Steger L, PA-C    Allergies Tramadol and Naproxen  No family history on file.  Social History Social History   Tobacco Use  . Smoking status: Current Every Day Smoker    Packs/day: 0.50  . Smokeless tobacco: Never Used  Substance Use Topics  . Alcohol use: No  . Drug use: No    Review of Systems Constitutional: No fever/chills Cardiovascular: Denies chest pain. Respiratory: Denies shortness of breath.  Positive cough. Positive right ear pain. Gastrointestinal: No abdominal pain.  No nausea, no vomiting.  No diarrhea.  Skin: Negative for rash. Neurological: Negative for headaches ___________________________________________   PHYSICAL EXAM:  VITAL SIGNS: ED Triage Vitals [05/06/17 1142]  Enc Vitals Group     BP 135/79     Pulse Rate (!) 107     Resp 17     Temp 98.8 F (37.1 C)     Temp Source Oral     SpO2 98 %     Weight 145 lb (65.8 kg)     Height 5\' 8"  (1.727 m)     Head Circumference      Peak Flow  Pain Score 8     Pain Loc      Pain Edu?      Excl. in GC?    Constitutional: Alert and oriented. Well appearing and in no acute distress. Eyes: Conjunctivae are normal.  Head: Atraumatic. Nose: No congestion/rhinnorhea. EACs are clear. Right EAC is dull with poor light reflex. Mouth/Throat: Mucous membranes are moist.  Oropharynx non-erythematous. Neck: No stridor.   Hematological/Lymphatic/Immunilogical: No cervical lymphadenopathy. Cardiovascular: Normal rate, regular rhythm. Grossly normal heart sounds.  Good peripheral circulation. Respiratory: Normal respiratory effort.  No retractions. Lungs no wheezes are noted. There is an  occasional rales which clears with cough. Musculoskeletal: No lower extremity tenderness nor edema.  No joint effusions. Neurologic:  Normal speech and language. No gross focal neurologic deficits are appreciated. Skin:  Skin is warm, dry and intact. No rash noted. Psychiatric: Mood and affect are normal. Speech and behavior are normal.  ____________________________________________   LABS (all labs ordered are listed, but only abnormal results are displayed)  Labs Reviewed - No data to display  PROCEDURES  Procedure(s) performed: None  Procedures  Critical Care performed: No  ____________________________________________   INITIAL IMPRESSION / ASSESSMENT AND PLAN / ED COURSE  Patient was with resolving community-acquired pneumonia. He was made aware that he may call for several more weeks. He is to finish his antibiotics today. Patient was also given a prescription for prednisone to be taken as directed for his right ear pain. He is to follow-up with his PCP this week. He is also encouraged to discontinue smoking.  ____________________________________________   FINAL CLINICAL IMPRESSION(S) / ED DIAGNOSES  Final diagnoses:  Otalgia of right ear  Community acquired pneumonia, unspecified laterality     ED Discharge Orders        Ordered    predniSONE (DELTASONE) 10 MG tablet  Status:  Discontinued     05/06/17 1420    predniSONE (DELTASONE) 10 MG tablet     05/06/17 1424       Note:  This document was prepared using Dragon voice recognition software and may include unintentional dictation errors.    Tommi Rumps, PA-C 05/06/17 1633    Governor Rooks, MD 05/07/17 1346

## 2017-05-09 ENCOUNTER — Encounter: Payer: Self-pay | Admitting: Emergency Medicine

## 2017-05-09 ENCOUNTER — Other Ambulatory Visit: Payer: Self-pay

## 2017-05-09 ENCOUNTER — Emergency Department
Admission: EM | Admit: 2017-05-09 | Discharge: 2017-05-09 | Disposition: A | Discharge status: Home / Self Care | Payer: Self-pay | Attending: Emergency Medicine | Admitting: Emergency Medicine

## 2017-05-09 DIAGNOSIS — H6981 Other specified disorders of Eustachian tube, right ear: Secondary | ICD-10-CM

## 2017-05-09 DIAGNOSIS — Z79899 Other long term (current) drug therapy: Secondary | ICD-10-CM | POA: Insufficient documentation

## 2017-05-09 DIAGNOSIS — F17210 Nicotine dependence, cigarettes, uncomplicated: Secondary | ICD-10-CM | POA: Insufficient documentation

## 2017-05-09 DIAGNOSIS — H6991 Unspecified Eustachian tube disorder, right ear: Secondary | ICD-10-CM | POA: Insufficient documentation

## 2017-05-09 MED ORDER — FEXOFENADINE-PSEUDOEPHED ER 60-120 MG PO TB12: 1.0000 | ORAL_TABLET | Freq: Two times a day (BID) | ORAL | 0 refills | Status: DC

## 2017-05-09 NOTE — ED Triage Notes (Signed)
Pt to ed with c/o right ear pain. Pt states he was seen on Saturday for same.  Reports started on abx but no relief.

## 2017-05-09 NOTE — ED Provider Notes (Signed)
[facility] Emergency Department Provider Note   ____________________________________________   First MD Initiated Contact with Patient 05/09/17 1350     (approximate)  I have reviewed the triage vital signs and the nursing notes.   HISTORY  Chief Complaint Otalgia    HPI Philip Owen is a 42 y.o. male patient complaining of right ear painfor 4 days. Patient was seen in this facility 4 days ago and prescribed prednisone. Patient state completed medications with no change of complaint. Patient denies hearing loss with this complaint. Patient denies vertigo. Patient described pain as "aching/pressure. No other palliative measures for complaint. Patient rates pain as a 7/10. Patient recently finished antibiotics for pneumonia.  Past Medical History:  Diagnosis Date  . Acid reflux   . Anxiety   . GERD (gastroesophageal reflux disease)     There are no active problems to display for this patient.   History reviewed. No pertinent surgical history.  Prior to Admission medications   Medication Sig Start Date End Date Taking? Authorizing Provider  albuterol (PROVENTIL HFA;VENTOLIN HFA) 108 (90 Base) MCG/ACT inhaler Inhale 2 puffs into the lungs every 6 (six) hours as needed for wheezing or shortness of breath. Patient not taking: Reported on 04/18/2017 04/06/16   Willy Eddy, MD  amoxicillin (AMOXIL) 500 MG tablet Take 1 tablet (500 mg total) by mouth 2 (two) times daily. 04/18/17   Sharman Cheek, MD  brompheniramine-pseudoephedrine-DM 30-2-10 MG/5ML syrup Take 5 mLs by mouth 4 (four) times daily as needed. 05/03/17   Joni Reining, PA-C  fexofenadine-pseudoephedrine (ALLEGRA-D) 60-120 MG 12 hr tablet Take 1 tablet by mouth 2 (two) times daily. 05/09/17   Joni Reining, PA-C  fluticasone (FLONASE) 50 MCG/ACT nasal spray Place 1 spray into both nostrils daily. 04/06/16 04/06/17  Willy Eddy, MD  ondansetron (ZOFRAN) 8 MG tablet Take 1 tablet  (8 mg total) by mouth every 8 (eight) hours as needed for nausea or vomiting. 05/03/17   Joni Reining, PA-C  oxyCODONE-acetaminophen (ROXICET) 5-325 MG tablet Take 1 tablet by mouth every 6 (six) hours as needed for severe pain. 04/18/17   Sharman Cheek, MD  predniSONE (DELTASONE) 10 MG tablet take 3 tablets once a day for the next 4 days. 05/06/17   Tommi Rumps, PA-C    Allergies Tramadol and Naproxen  History reviewed. No pertinent family history.  Social History Social History   Tobacco Use  . Smoking status: Current Every Day Smoker    Packs/day: 0.50  . Smokeless tobacco: Never Used  Substance Use Topics  . Alcohol use: No  . Drug use: No    Review of Systems Constitutional: No fever/chills Eyes: No visual changes. ENT: No sore throat. Cardiovascular: Denies chest pain. Respiratory: Denies shortness of breath. Gastrointestinal: No abdominal pain.  No nausea, no vomiting.  No diarrhea.  No constipation. Genitourinary: Negative for dysuria. Musculoskeletal: Negative for back pain. Skin: Negative for rash. Neurological: Negative for headaches, focal weakness or numbness. Psychiatric:Anxiety Endocrine: Hematological/Lymphatic: Allergic/Immunilogical: Tramadol and naproxen ____________________________________________   PHYSICAL EXAM:  VITAL SIGNS: ED Triage Vitals  Enc Vitals Group     BP 05/09/17 1314 (!) 146/79     Pulse Rate 05/09/17 1314 96     Resp 05/09/17 1314 20     Temp 05/09/17 1314 97.9 F (36.6 C)     Temp Source 05/09/17 1314 Oral     SpO2 05/09/17 1314 98 %     Weight 05/09/17 1315 145 lb (65.8 kg)  Height 05/09/17 1315 5\' 8"  (1.727 m)     Head Circumference --      Peak Flow --      Pain Score 05/09/17 1318 7     Pain Loc --      Pain Edu? --      Excl. in GC? --    Constitutional: Alert and oriented. Well appearing and in no acute distress. Nose: Edematous nasal turbinates Mouth/Throat: Mucous membranes are moist.   Oropharynx non-erythematous. Hematological/Lymphatic/Immunilogical: No cervical lymphadenopathy. Cardiovascular: Normal rate, regular rhythm. Grossly normal heart sounds.  Good peripheral circulation. Respiratory: Normal respiratory effort.  No retractions. Lungs CTAB. Skin:  Skin is warm, dry and intact. No rash noted. Psychiatric: Mood and affect are normal. Speech and behavior are normal.  ____________________________________________   LABS (all labs ordered are listed, but only abnormal results are displayed)  Labs Reviewed - No data to display ____________________________________________  EKG   ____________________________________________  RADIOLOGY  No results found.  ____________________________________________   PROCEDURES  Procedure(s) performed: None  Procedures  Critical Care performed: No  ____________________________________________   INITIAL IMPRESSION / ASSESSMENT AND PLAN / ED COURSE  As part of my medical decision making, I reviewed the following data within the electronic MEDICAL RECORD NUMBER    Otalgia of right ear secondary to Station tube dysfunction. Patient given discharge Instructions. Patient advised to take medication as directed and follow with the ENT clinic condition persists.      ____________________________________________   FINAL CLINICAL IMPRESSION(S) / ED DIAGNOSES  Final diagnoses:  Eustachian tube dysfunction, right     ED Discharge Orders        Ordered    fexofenadine-pseudoephedrine (ALLEGRA-D) 60-120 MG 12 hr tablet  2 times daily     05/09/17 1355       Note:  This document was prepared using Dragon voice recognition software and may include unintentional dictation errors.    Joni ReiningSmith, Caeli Linehan K, PA-C 05/09/17 1401    Sharman CheekStafford, Phillip, MD 05/09/17 1534

## 2017-05-11 ENCOUNTER — Other Ambulatory Visit: Payer: Self-pay

## 2017-05-11 ENCOUNTER — Emergency Department
Admission: EM | Admit: 2017-05-11 | Discharge: 2017-05-11 | Disposition: A | Discharge status: Home / Self Care | Payer: Medicaid Other | Attending: Student in an Organized Health Care Education/Training Program | Admitting: Student in an Organized Health Care Education/Training Program

## 2017-05-11 ENCOUNTER — Encounter: Payer: Self-pay | Admitting: Emergency Medicine

## 2017-05-11 DIAGNOSIS — F17210 Nicotine dependence, cigarettes, uncomplicated: Secondary | ICD-10-CM | POA: Insufficient documentation

## 2017-05-11 DIAGNOSIS — H66001 Acute suppurative otitis media without spontaneous rupture of ear drum, right ear: Secondary | ICD-10-CM

## 2017-05-11 MED ORDER — AMOXICILLIN 500 MG PO CAPS: 500.0000 mg | ORAL_CAPSULE | Freq: Three times a day (TID) | ORAL | 0 refills | Status: DC

## 2017-05-11 NOTE — Discharge Instructions (Signed)
Follow-up with your primary care provider if any continued problems.  Begin taking amoxicillin 500 mg 3 times daily for 10 days.  Tylenol as needed for fever or ear pain.

## 2017-05-11 NOTE — ED Provider Notes (Signed)
[facility] ____________________________________________   First MD Initiated Contact with Patient 05/11/17 [phone]     (approximate)  I have reviewed the triage vital signs and the nursing notes.   HISTORY  Chief Complaint Otalgia HPI Philip Owen is a 42 y.o. male is here with complaint of right ear pain.  Patient states that pain started approximately 1 week ago.  He has been using Allegra-D without any relief.  He is unaware of any fever.  He states that he was treated pneumonia recently and finished a Z-Pak.  He rates his pain as 5/10.  Past Medical History:  Diagnosis Date  . Acid reflux   . Anxiety   . GERD (gastroesophageal reflux disease)     There are no active problems to display for this patient.   History reviewed. No pertinent surgical history.  Prior to Admission medications   Medication Sig Start Date End Date Taking? Authorizing Provider  amoxicillin (AMOXIL) 500 MG capsule Take 1 capsule (500 mg total) by mouth 3 (three) times daily. 05/11/17   Tommi Rumps, PA-C    Allergies Tramadol and Naproxen  No family history on file.  Social History Social History   Tobacco Use  . Smoking status: Current Every Day Smoker    Packs/day: 0.50  . Smokeless tobacco: Never Used  Substance Use Topics  . Alcohol use: No  . Drug use: No    Review of Systems Constitutional: No fever/chills Eyes: No visual changes. ENT: Positive right ear pain. Cardiovascular: Denies chest pain. Respiratory: Denies shortness of breath. Gastrointestinal: No abdominal pain.  Musculoskeletal: Negative for back pain. Skin: Negative for rash. Neurological: Negative for headaches, focal weakness or numbness. ____________________________________________   PHYSICAL EXAM:  VITAL SIGNS: ED Triage Vitals  Enc Vitals Group     BP 05/11/17 0807 138/80     Pulse Rate 05/11/17 0807 80     Resp 05/11/17 0807 18     Temp 05/11/17 0807 98.3 F (36.8  C)     Temp Source 05/11/17 0807 Oral     SpO2 05/11/17 0807 99 %     Weight 05/11/17 0808 145 lb (65.8 kg)     Height --      Head Circumference --      Peak Flow --      Pain Score 05/11/17 0807 5     Pain Loc --      Pain Edu? --      Excl. in GC? --    Constitutional: Alert and oriented. Well appearing and in no acute distress. Eyes: Conjunctivae are normal.  Head: Atraumatic. Nose: No congestion/rhinnorhea.  EACs are clear bilaterally.  Left TM is dull.  Right TM is mildly erythematous with poor light reflex. Mouth/Throat: Mucous membranes are moist.  Oropharynx non-erythematous.  Posterior drainage. Neck: No stridor.   Hematological/Lymphatic/Immunilogical: No cervical lymphadenopathy. Cardiovascular: Normal rate, regular rhythm. Grossly normal heart sounds.  Good peripheral circulation. Respiratory: Normal respiratory effort.  No retractions. Lungs CTAB. Musculoskeletal: No lower extremity tenderness nor edema.  No joint effusions. Neurologic:  Normal speech and language. No gross focal neurologic deficits are appreciated. No gait instability. Skin:  Skin is warm, dry and intact. No rash noted. Psychiatric: Mood and affect are normal. Speech and behavior are normal.  ____________________________________________   LABS (all labs ordered are listed, but only abnormal results are displayed)  Labs Reviewed - No data to display  PROCEDURES  Procedure(s) performed: None  Procedures  Critical Care performed:  No  ____________________________________________   INITIAL IMPRESSION / ASSESSMENT AND PLAN / ED COURSE Patient was given a prescription for amoxicillin 500 mg 3 times daily for 10 days.  He is to follow-up with his PCP if any continued problems.  ____________________________________________   FINAL CLINICAL IMPRESSION(S) / ED DIAGNOSES  Final diagnoses:  Acute suppurative otitis media of right ear without spontaneous rupture of tympanic membrane, recurrence  not specified     ED Discharge Orders        Ordered    amoxicillin (AMOXIL) 500 MG capsule  3 times daily     05/11/17 0905       Note:  This document was prepared using Dragon voice recognition software and may include unintentional dictation errors.   Tommi RumpsSummers, Everette Mall L, PA-C 05/11/17 [id number]    Willy Eddyobinson, Patrick, MD 05/11/17 1110

## 2017-05-11 NOTE — ED Notes (Signed)
See triage note  States he was seen on Tuesday for 4 day hx of ear pain   States he thinks his pain is worse to right ear  No fever or drainage

## 2017-05-11 NOTE — ED Triage Notes (Signed)
Pt reports right ear pain for approximately one week. Pt reports has used Allegra D without relief. Denies any other symptoms. Ambulatory to triage, no apparent distress noted.

## 2017-05-14 ENCOUNTER — Emergency Department
Admission: EM | Admit: 2017-05-14 | Discharge: 2017-05-14 | Disposition: A | Discharge status: Home / Self Care | Payer: Self-pay | Attending: Emergency Medicine | Admitting: Emergency Medicine

## 2017-05-14 ENCOUNTER — Encounter: Payer: Self-pay | Admitting: Emergency Medicine

## 2017-05-14 ENCOUNTER — Other Ambulatory Visit: Payer: Self-pay

## 2017-05-14 DIAGNOSIS — F17200 Nicotine dependence, unspecified, uncomplicated: Secondary | ICD-10-CM | POA: Insufficient documentation

## 2017-05-14 DIAGNOSIS — F419 Anxiety disorder, unspecified: Secondary | ICD-10-CM | POA: Insufficient documentation

## 2017-05-14 DIAGNOSIS — H6501 Acute serous otitis media, right ear: Secondary | ICD-10-CM | POA: Insufficient documentation

## 2017-05-14 MED ORDER — PREDNISONE 10 MG (21) PO TBPK: ORAL_TABLET | ORAL | 0 refills | Status: DC

## 2017-05-14 MED ORDER — SULFAMETHOXAZOLE-TRIMETHOPRIM 800-160 MG PO TABS: 1.0000 | ORAL_TABLET | Freq: Two times a day (BID) | ORAL | 0 refills | Status: DC

## 2017-05-14 NOTE — ED Provider Notes (Signed)
[facility] Emergency Department Provider Note  ____________________________________________   First MD Initiated Contact with Patient 05/14/17 1234     (approximate)  I have reviewed the triage vital signs and the nursing notes.   HISTORY  Chief Complaint Otalgia    HPI Philip Owen is a 42 y.o. male complains of right ear pain, he states he was treated for pneumonia with a Z-Pak recently, then his ear started to hurt, he was given a prescription for amoxicillin, he states he has been compliant with the medication but the ear continues to be stopped up, he is concerned because he cannot hear as well out of the right ear, he denies any drainage, no fever or chills, no sore throat  Past Medical History:  Diagnosis Date  . Acid reflux   . Anxiety   . GERD (gastroesophageal reflux disease)     There are no active problems to display for this patient.   History reviewed. No pertinent surgical history.  Prior to Admission medications   Medication Sig Start Date End Date Taking? Authorizing Provider  predniSONE (STERAPRED UNI-PAK 21 TAB) 10 MG (21) TBPK tablet Take 6 pills on day one then decrease by 1 pill each day 05/14/17   Faythe GheeFisher, Millee Denise W, PA-C  sulfamethoxazole-trimethoprim (BACTRIM DS,SEPTRA DS) 800-160 MG tablet Take 1 tablet by mouth 2 (two) times daily. 05/14/17   Faythe GheeFisher, Wadsworth Skolnick W, PA-C    Allergies Tramadol and Naproxen  No family history on file.  Social History Social History   Tobacco Use  . Smoking status: Current Every Day Smoker    Packs/day: 0.50  . Smokeless tobacco: Never Used  Substance Use Topics  . Alcohol use: No  . Drug use: No    Review of Systems  Constitutional: No fever/chills Eyes: No visual changes. ENT: No sore throat.  Positive for right ear pain/pressure Respiratory: Denies cough Genitourinary: Negative for dysuria. Musculoskeletal: Negative for back pain. Skin: Negative for  rash.    ____________________________________________   PHYSICAL EXAM:  VITAL SIGNS: ED Triage Vitals  Enc Vitals Group     BP 05/14/17 1218 (!) 139/94     Pulse Rate 05/14/17 1218 74     Resp 05/14/17 1218 16     Temp 05/14/17 1218 97.7 F (36.5 C)     Temp Source 05/14/17 1218 Oral     SpO2 05/14/17 1218 96 %     Weight 05/14/17 1208 145 lb (65.8 kg)     Height 05/14/17 1208 5\' 8"  (1.727 m)     Head Circumference --      Peak Flow --      Pain Score 05/14/17 1208 0     Pain Loc --      Pain Edu? --      Excl. in GC? --     Constitutional: Alert and oriented. Well appearing and in no acute distress. Eyes: Conjunctivae are normal.  Head: Atraumatic. EARS: Right TM is red and swollen, landmarks are not visualized, there is no drainage in the ear canal Nose: No congestion/rhinnorhea. Mouth/Throat: Mucous membranes are moist.  Throat appears normal Cardiovascular: Normal rate, regular rhythm.  Heart sounds are normal Respiratory: Normal respiratory effort.  No retractions, lungs clear to auscultation GU: deferred Musculoskeletal: FROM all extremities, warm and well perfused Neurologic:  Normal speech and language.  Skin:  Skin is warm, dry and intact. No rash noted. Psychiatric: Mood and affect are normal. Speech and behavior are normal.  ____________________________________________  LABS (all labs ordered are listed, but only abnormal results are displayed)  Labs Reviewed - No data to display ____________________________________________   ____________________________________________  RADIOLOGY    ____________________________________________   PROCEDURES  Procedure(s) performed: No  Procedures    ____________________________________________   INITIAL IMPRESSION / ASSESSMENT AND PLAN / ED COURSE  Pertinent labs & imaging results that were available during my care of the patient were reviewed by me and considered in my medical decision making (see  chart for details).  42 year old male complaining of right ear pain, he has recently been treated for pneumonia with a Z-Pak, then he started having right ear pain, was given amoxicillin, he states the ear still clogged up and hurts, he states is not any better, he denies fever chills  On physical exam the right ear is still red swollen and landmarks are not able to be visualized  Diagnosis is acute otitis media in an adult, explained to him that with the 2 medications he has had we will try one more antibiotic that may hit atypical bacteria, along with a steroid pack to reduce the swelling, if he is not improved with this medication he should follow-up with ear nose and throat, he was given the phone number for Dr. Willeen Cass, the patient states he understands will comply with instructions, he was discharged in stable condition     As part of my medical decision making, I reviewed the following data within the electronic MEDICAL RECORD NUMBER Old chart reviewed, Notes from prior ED visits and [place] Controlled Substance Database  ____________________________________________   FINAL CLINICAL IMPRESSION(S) / ED DIAGNOSES  Final diagnoses:  Right acute serous otitis media, recurrence not specified      NEW MEDICATIONS STARTED DURING THIS VISIT:  Discharge Medication List as of 05/14/2017 12:44 PM    START taking these medications   Details  predniSONE (STERAPRED UNI-PAK 21 TAB) 10 MG (21) TBPK tablet Take 6 pills on day one then decrease by 1 pill each day, Print    sulfamethoxazole-trimethoprim (BACTRIM DS,SEPTRA DS) 800-160 MG tablet Take 1 tablet by mouth 2 (two) times daily., Starting Sun 05/14/2017, Print         Note:  This document was prepared using Dragon voice recognition software and may include unintentional dictation errors.    Faythe Ghee, PA-C 05/14/17 1327    Arnaldo Natal, MD 05/14/17 [phone]

## 2017-05-14 NOTE — ED Triage Notes (Signed)
Arrives with c/o right ear clogged.  States has taken amoxicillin, but ear is "just not any better".

## 2017-05-14 NOTE — ED Notes (Signed)

## 2017-05-14 NOTE — Discharge Instructions (Signed)
Follow-up with your regular doctor or the ear nose and throat doctor if you are not better in 3 days, use medication as prescribed, if you are worsening please return the emergency department

## 2017-05-22 ENCOUNTER — Encounter: Payer: Self-pay | Admitting: Internal Medicine

## 2017-05-22 ENCOUNTER — Other Ambulatory Visit: Payer: Self-pay

## 2017-05-22 ENCOUNTER — Emergency Department: Payer: Self-pay

## 2017-05-22 ENCOUNTER — Inpatient Hospital Stay
Admission: EM | Admit: 2017-05-22 | Discharge: 2017-05-23 | DRG: 871 | Disposition: A | Discharge status: Home / Self Care | Payer: Self-pay | Attending: Internal Medicine | Admitting: Internal Medicine

## 2017-05-22 DIAGNOSIS — J189 Pneumonia, unspecified organism: Secondary | ICD-10-CM | POA: Diagnosis present

## 2017-05-22 DIAGNOSIS — Z885 Allergy status to narcotic agent status: Secondary | ICD-10-CM

## 2017-05-22 DIAGNOSIS — T40604A Poisoning by unspecified narcotics, undetermined, initial encounter: Secondary | ICD-10-CM

## 2017-05-22 DIAGNOSIS — R0902 Hypoxemia: Secondary | ICD-10-CM | POA: Diagnosis present

## 2017-05-22 DIAGNOSIS — J690 Pneumonitis due to inhalation of food and vomit: Secondary | ICD-10-CM | POA: Diagnosis present

## 2017-05-22 DIAGNOSIS — R1110 Vomiting, unspecified: Secondary | ICD-10-CM | POA: Diagnosis present

## 2017-05-22 DIAGNOSIS — F1110 Opioid abuse, uncomplicated: Secondary | ICD-10-CM | POA: Diagnosis present

## 2017-05-22 DIAGNOSIS — F1910 Other psychoactive substance abuse, uncomplicated: Secondary | ICD-10-CM | POA: Diagnosis present

## 2017-05-22 DIAGNOSIS — R0602 Shortness of breath: Secondary | ICD-10-CM

## 2017-05-22 DIAGNOSIS — A419 Sepsis, unspecified organism: Principal | ICD-10-CM | POA: Diagnosis present

## 2017-05-22 DIAGNOSIS — E860 Dehydration: Secondary | ICD-10-CM | POA: Diagnosis present

## 2017-05-22 DIAGNOSIS — R000 Tachycardia, unspecified: Secondary | ICD-10-CM

## 2017-05-22 DIAGNOSIS — T40601A Poisoning by unspecified narcotics, accidental (unintentional), initial encounter: Secondary | ICD-10-CM | POA: Diagnosis present

## 2017-05-22 DIAGNOSIS — F17210 Nicotine dependence, cigarettes, uncomplicated: Secondary | ICD-10-CM | POA: Diagnosis present

## 2017-05-22 DIAGNOSIS — Z886 Allergy status to analgesic agent status: Secondary | ICD-10-CM

## 2017-05-22 LAB — COMPREHENSIVE METABOLIC PANEL
ALBUMIN: 4.4 g/dL (ref 3.5–5.0)
ALT: 30 U/L (ref 17–63)
ANION GAP: 13 (ref 5–15)
AST: 31 U/L (ref 15–41)
Alkaline Phosphatase: 85 U/L (ref 38–126)
BUN: 21 mg/dL — ABNORMAL HIGH (ref 6–20)
CO2: 29 mmol/L (ref 22–32)
Calcium: 9.1 mg/dL (ref 8.9–10.3)
Chloride: 100 mmol/L — ABNORMAL LOW (ref 101–111)
Creatinine, Ser: 1.11 mg/dL (ref 0.61–1.24)
GFR calc Af Amer: >60 mL/min (ref >60)
Glucose, Bld: 143 mg/dL — ABNORMAL HIGH (ref 65–99)
POTASSIUM: 4.8 mmol/L (ref 3.5–5.1)
Sodium: 142 mmol/L (ref 135–145)
Total Bilirubin: 0.5 mg/dL (ref 0.3–1.2)
Total Protein: 7.8 g/dL (ref 6.5–8.1)

## 2017-05-22 LAB — LACTIC ACID, PLASMA
LACTIC ACID, VENOUS: 1.5 mmol/L (ref 0.5–1.9)
Lactic Acid, Venous: 3.6 mmol/L (ref 0.5–1.9)
Lactic Acid, Venous: 1.9 mmol/L (ref 0.5–1.9)

## 2017-05-22 LAB — ACETAMINOPHEN LEVEL: Acetaminophen (Tylenol), Serum: <10 ug/mL — ABNORMAL LOW (ref 10–30)

## 2017-05-22 LAB — URINE DRUG SCREEN, QUALITATIVE (ARMC ONLY)
AMPHETAMINES, UR SCREEN: NOT DETECTED
Barbiturates, Ur Screen: NOT DETECTED
Benzodiazepine, Ur Scrn: NOT DETECTED
Cannabinoid 50 Ng, Ur ~~LOC~~: POSITIVE — AB
Cocaine Metabolite,Ur ~~LOC~~: POSITIVE — AB
MDMA (ECSTASY) UR SCREEN: NOT DETECTED
Methadone Scn, Ur: NOT DETECTED
Opiate, Ur Screen: POSITIVE — AB
Phencyclidine (PCP) Ur S: NOT DETECTED
TRICYCLIC, UR SCREEN: NOT DETECTED

## 2017-05-22 LAB — CBC
HEMATOCRIT: 40.3 % (ref 40.0–52.0)
Hemoglobin: 13.6 g/dL (ref 13.0–18.0)
MCH: 29.1 pg (ref 26.0–34.0)
MCHC: 33.9 g/dL (ref 32.0–36.0)
MCV: 85.9 fL (ref 80.0–100.0)
Platelets: 408 10*3/uL (ref 150–440)
RBC: 4.69 MIL/uL (ref 4.40–5.90)
RDW: 13.5 % (ref 11.5–14.5)
WBC: 30.4 10*3/uL — ABNORMAL HIGH (ref 3.8–10.6)

## 2017-05-22 LAB — TYPE AND SCREEN
ABO/RH(D): B NEG
ANTIBODY SCREEN: NEGATIVE

## 2017-05-22 LAB — SALICYLATE LEVEL

## 2017-05-22 MED ORDER — ACETAMINOPHEN 650 MG RE SUPP: 650.0000 mg | Freq: Four times a day (QID) | RECTAL | Status: DC | PRN

## 2017-05-22 MED ORDER — VANCOMYCIN HCL IN DEXTROSE 1-5 GM/200ML-% IV SOLN
1000.0000 mg | Freq: Once | INTRAVENOUS | Status: AC
  Administered 2017-05-22: 1000 mg via INTRAVENOUS
  Filled 2017-05-22: qty 200

## 2017-05-22 MED ORDER — ONDANSETRON HCL 4 MG/2ML IJ SOLN
4.0000 mg | Freq: Once | INTRAMUSCULAR | Status: AC
  Administered 2017-05-22: 4 mg via INTRAVENOUS
  Filled 2017-05-22: qty 2

## 2017-05-22 MED ORDER — ACETAMINOPHEN 325 MG PO TABS
650.0000 mg | ORAL_TABLET | Freq: Four times a day (QID) | ORAL | Status: DC | PRN
  Administered 2017-05-22 – 2017-05-23 (×3): 650 mg via ORAL
  Filled 2017-05-22 (×3): qty 2

## 2017-05-22 MED ORDER — ONDANSETRON HCL 4 MG/2ML IJ SOLN
4.0000 mg | Freq: Four times a day (QID) | INTRAMUSCULAR | Status: DC | PRN
  Administered 2017-05-22: 20:00:00 4 mg via INTRAVENOUS
  Filled 2017-05-22: qty 2

## 2017-05-22 MED ORDER — SODIUM CHLORIDE 0.9 % IV SOLN
INTRAVENOUS | Status: DC
  Administered 2017-05-22 – 2017-05-23 (×3): via INTRAVENOUS
  Administered 2017-05-23: 10:00:00 125 mL/h via INTRAVENOUS

## 2017-05-22 MED ORDER — SENNOSIDES-DOCUSATE SODIUM 8.6-50 MG PO TABS: 1.0000 | ORAL_TABLET | Freq: Every evening | ORAL | Status: DC | PRN

## 2017-05-22 MED ORDER — ENOXAPARIN SODIUM 40 MG/0.4ML ~~LOC~~ SOLN
40.0000 mg | SUBCUTANEOUS | Status: DC
  Administered 2017-05-22: 40 mg via SUBCUTANEOUS
  Filled 2017-05-22: qty 0.4

## 2017-05-22 MED ORDER — PANTOPRAZOLE SODIUM 40 MG PO TBEC
40.0000 mg | DELAYED_RELEASE_TABLET | Freq: Every day | ORAL | Status: DC
Start: 2017-05-22 — End: 2017-05-23
  Administered 2017-05-22 – 2017-05-23 (×2): 40 mg via ORAL
  Filled 2017-05-22 (×2): qty 1

## 2017-05-22 MED ORDER — PIPERACILLIN-TAZOBACTAM 3.375 G IVPB
3.3750 g | Freq: Three times a day (TID) | INTRAVENOUS | Status: DC
  Administered 2017-05-22 – 2017-05-23 (×3): 3.375 g via INTRAVENOUS
  Filled 2017-05-22 (×3): qty 50

## 2017-05-22 MED ORDER — ONDANSETRON HCL 4 MG PO TABS: 4.0000 mg | ORAL_TABLET | Freq: Four times a day (QID) | ORAL | Status: DC | PRN

## 2017-05-22 MED ORDER — SODIUM CHLORIDE 0.9 % IV BOLUS (SEPSIS)
1000.0000 mL | Freq: Once | INTRAVENOUS | Status: AC
  Administered 2017-05-22: 1000 mL via INTRAVENOUS

## 2017-05-22 MED ORDER — PIPERACILLIN-TAZOBACTAM 3.375 G IVPB 30 MIN
3.3750 g | Freq: Once | INTRAVENOUS | Status: AC
  Administered 2017-05-22: 3.375 g via INTRAVENOUS
  Filled 2017-05-22: qty 50

## 2017-05-22 NOTE — Progress Notes (Signed)
Admitted this morning because of sepsis due to pneumonia.  Labs reviewed, chart reviewed, medications reviewed. 1.  Sepsis secondary to aspiration pneumonia: Continue IV fluids, IV antibiotics with vancomycin, Zosyn, lactic acid level coming down. 2.  Polysubstance abuse,, tobacco abuse: Patient had unintentional opiate overdose, lethargic in the emergency room.  Watch closely. Time spent 20 minutes.

## 2017-05-22 NOTE — Progress Notes (Signed)
Pharmacy Antibiotic Note  Philip Owen is a 42 y.o. male admitted on 05/22/2017 with pneumonia.  Pharmacy has been consulted for zosyn dosing.  Plan: Zosyn 3.375g IV q8h (4 hour infusion).     No data recorded.  Recent Labs  Lab 05/22/17 0431 05/22/17 0518  WBC 30.4*  --   CREATININE 1.11  --   LATICACIDVEN  --  3.6*    Estimated Creatinine Clearance: 83.2 mL/min (by C-G formula based on SCr of 1.11 mg/dL).    Allergies  Allergen Reactions  . Tramadol Itching  . Naproxen     Itching     Antimicrobials this admission: Anti-infectives (From admission, onward)   Start     Dose/Rate Route Frequency Ordered Stop   05/22/17 1400  piperacillin-tazobactam (ZOSYN) IVPB 3.375 g     3.375 g 12.5 mL/hr over 240 Minutes Intravenous Every 8 hours 05/22/17 0623     05/22/17 0545  piperacillin-tazobactam (ZOSYN) IVPB 3.375 g     3.375 g 100 mL/hr over 30 Minutes Intravenous  Once 05/22/17 0539 05/22/17 0657   05/22/17 0545  vancomycin (VANCOCIN) IVPB 1000 mg/200 mL premix     1,000 mg 200 mL/hr over 60 Minutes Intravenous  Once 05/22/17 0539 05/22/17 [id number]       Microbiology results: No results found for this or any previous visit (from the past 240 hour(s)).   Thank you for allowing pharmacy to be a part of this patient's care.  Philip Owen 05/22/2017 7:24 AM

## 2017-05-22 NOTE — Progress Notes (Signed)
Patient refuses bed alarms

## 2017-05-22 NOTE — H&P (Signed)
[facility]   PATIENT NAME: Philip AustinBradley Creighton    MR#:  [id number]  DATE OF BIRTH:  27-Sep-1975  DATE OF ADMISSION:  05/22/2017  PRIMARY CARE PHYSICIAN: Evelene CroonNiemeyer, [place], MD   REQUESTING/REFERRING PHYSICIAN:   CHIEF COMPLAINT:   Chief Complaint  Patient presents with  . Drug Overdose    HISTORY OF PRESENT ILLNESS: Philip Owen  is a 42 y.o. male with a known history of GERD, anxiety disorder presented to the emergency room with unresponsiveness.  The patient was found unresponsive and EMS was called by his brother.  Patient [place].  Patient is awake now in the emergency room but when he was initially brought he was very somnolent lethargic.  And patient passed out he was given IV Narcan by the EMS and patient woke up was shaky and agitated.  He currently still is lethargic in the emergency room and drifts back to sleep in between conversation.  Patient abuses opiates and drinks alcohol and an active smoker.  Had some vomitings and chest x-ray revealed pneumonia probably secondary to aspiration.  No complaints of any chest pain.  No fevers.  No thoughts of hurting himself, hurting others.  Does not feel depressed and no feelings of any hopelessness or any worthlessness.  PAST MEDICAL HISTORY:   Past Medical History:  Diagnosis Date  . Acid reflux   . Anxiety   . GERD (gastroesophageal reflux disease)     PAST SURGICAL HISTORY:  Past Surgical History:  Procedure Laterality Date  . none      SOCIAL HISTORY:  Social History   Tobacco Use  . Smoking status: Current Every Day Smoker    Packs/day: 0.50  . Smokeless tobacco: Never Used  Substance Use Topics  . Alcohol use: No    FAMILY HISTORY:  Family History  Problem Relation Age of Onset  . Brain cancer Father     DRUG ALLERGIES:  Allergies  Allergen Reactions  . Tramadol Itching  . Naproxen     Itching     REVIEW OF  SYSTEMS:   CONSTITUTIONAL: No fever, fatigue or weakness.  EYES: No blurred or double vision.  EARS, NOSE, AND THROAT: No tinnitus or ear pain.  RESPIRATORY: No cough, shortness of breath, wheezing or hemoptysis.  CARDIOVASCULAR: No chest pain, orthopnea, edema.  GASTROINTESTINAL: No nausea, had vomiting,  No diarrhea or abdominal pain.  GENITOURINARY: No dysuria, hematuria.  ENDOCRINE: No polyuria, nocturia,  HEMATOLOGY: No anemia, easy bruising or bleeding SKIN: No rash or lesion. MUSCULOSKELETAL: No joint pain or arthritis.   NEUROLOGIC: No tingling, numbness, weakness.  PSYCHIATRY: No anxiety or depression.   MEDICATIONS AT HOME:  Prior to Admission medications   Medication Sig Start Date End Date Taking? Authorizing Provider  amoxicillin (AMOXIL) 500 MG capsule Take 1 capsule by mouth 3 (three) times daily. For 10 days 05/13/17  Yes [provider]  ibuprofen (ADVIL,MOTRIN) 800 MG tablet Take 1 tablet by mouth every 8 (eight) hours as needed. 05/03/17  Yes [provider]  ondansetron (ZOFRAN) 8 MG tablet Take 1 tablet by mouth every 8 (eight) hours as needed. 05/03/17  Yes [provider]  sulfamethoxazole-trimethoprim (BACTRIM DS,SEPTRA DS) 800-160 MG tablet Take 1 tablet by mouth 2 (two) times daily. 05/14/17  Yes Fisher, Roselyn BeringSusan W, PA-C      PHYSICAL EXAMINATION:   VITAL SIGNS: Blood pressure 102/74, pulse (!) 106, resp. rate 12, SpO2 97 %.  GENERAL:  42 y.o.-year-old patient lying in the bed with no acute distress.  EYES: Pupils equal, round, reactive to light and accommodation. No scleral icterus. Extraocular muscles intact.  HEENT: Head atraumatic, normocephalic. Oropharynx dry and nasopharynx clear.  NECK:  Supple, no jugular venous distention. No thyroid enlargement, no tenderness.  LUNGS: Normal breath sounds bilaterally, no wheezing, rales,rhonchi or crepitation. No use of accessory muscles of respiration.  CARDIOVASCULAR: S1, S2 normal. No  murmurs, rubs, or gallops.  ABDOMEN: Soft, nontender, nondistended. Bowel sounds present. No organomegaly or mass.  EXTREMITIES: No pedal edema, cyanosis, or clubbing.  NEUROLOGIC: Cranial nerves II through XII are intact. Muscle strength 5/5 in all extremities. Sensation intact. Gait not checked.  PSYCHIATRIC: The patient is alert and oriented x 3.  SKIN: No obvious rash, lesion, or ulcer.   LABORATORY PANEL:   CBC Recent Labs  Lab 05/22/17 0431  WBC 30.4*  HGB 13.6  HCT 40.3  PLT 408  MCV 85.9  MCH 29.1  MCHC 33.9  RDW 13.5   ------------------------------------------------------------------------------------------------------------------  Chemistries  Recent Labs  Lab 05/22/17 0431  NA 142  K 4.8  CL 100*  CO2 29  GLUCOSE 143*  BUN 21*  CREATININE 1.11  CALCIUM 9.1  AST 31  ALT 30  ALKPHOS 85  BILITOT 0.5   ------------------------------------------------------------------------------------------------------------------ estimated creatinine clearance is 83.2 mL/min (by C-G formula based on SCr of 1.11 mg/dL). ------------------------------------------------------------------------------------------------------------------ No results for input(s): TSH, T4TOTAL, T3FREE, THYROIDAB in the last 72 hours.  Invalid input(s): FREET3   Coagulation profile No results for input(s): INR, PROTIME in the last 168 hours. ------------------------------------------------------------------------------------------------------------------- No results for input(s): DDIMER in the last 72 hours. -------------------------------------------------------------------------------------------------------------------  Cardiac Enzymes No results for input(s): CKMB, TROPONINI, MYOGLOBIN in the last 168 hours.  Invalid input(s): CK ------------------------------------------------------------------------------------------------------------------ Invalid input(s):  POCBNP  ---------------------------------------------------------------------------------------------------------------  Urinalysis    Component Value Date/Time   COLORURINE YELLOW (A) 08/16/2015 1148   APPEARANCEUR CLEAR (A) 08/16/2015 1148   APPEARANCEUR Clear 12/13/2012 0549   LABSPEC 1.015 08/16/2015 1148   LABSPEC 1.025 12/13/2012 0549   PHURINE 5.0 08/16/2015 1148   GLUCOSEU NEGATIVE 08/16/2015 1148   GLUCOSEU Negative 12/13/2012 0549   HGBUR NEGATIVE 08/16/2015 1148   BILIRUBINUR NEGATIVE 08/16/2015 1148   BILIRUBINUR Negative 12/13/2012 0549   KETONESUR NEGATIVE 08/16/2015 1148   PROTEINUR NEGATIVE 08/16/2015 1148   NITRITE NEGATIVE 08/16/2015 1148   LEUKOCYTESUR NEGATIVE 08/16/2015 1148   LEUKOCYTESUR Negative 12/13/2012 0549     RADIOLOGY: Dg Chest Port 1 View  Result Date: 05/22/2017 CLINICAL DATA:  Patient was found on the floor. Possible opiate overdose. EXAM: PORTABLE CHEST 1 VIEW COMPARISON:  05/03/2017 FINDINGS: Shallow inspiration. Increasing infiltration in the right lung base consistent with progression of pneumonia. Left lung is clear. Heart size and pulmonary vascularity are normal. No blunting of costophrenic angles. No pneumothorax. Mediastinal contours appear intact. IMPRESSION: Increasing infiltration in the right lung base since previous study suggesting progression of pneumonia. Electronically Signed   By: Burman Nieves M.D.   On: 05/22/2017 05:19    EKG: Orders placed or performed during the hospital encounter of 05/22/17  . ED EKG  . ED EKG  . EKG 12-Lead  . EKG 12-Lead    IMPRESSION AND PLAN: 42 year old male patient with history of substance abuse, tobacco abuse, GERD, anxiety disorder presented to the emergency room after passing out.  Admitting diagnosis 1.  Unintentional opiate overdose 2.  Substance abuse 3.  Dehydration 4.  Aspiration pneumonia 5.  Tobacco abuse Treatment  plan Admit patient to medical floor IV fluid  hydration Start patient on IV Zosyn antibiotic Substance abuse counseling given to the patient  All the records are reviewed and case discussed with ED provider. Management plans discussed with the patient, family and they are in agreement.  CODE STATUS:FULL CODE Code Status History    This patient does not have a recorded code status. Please follow your organizational policy for patients in this situation.       TOTAL TIME TAKING CARE OF THIS PATIENT: 50 minutes.    Ihor Austin M.D on 05/22/2017 at 6:28 AM  Between 7am to 6pm - Pager - [phone]  After 6pm go to [email or web address] - password [email or web address]  [facility]  Office  [phone]  CC: Primary care physician; Evelene Croon, MD

## 2017-05-22 NOTE — ED Triage Notes (Signed)
Pt found on floor of residence by brother. Pt with opiate overdose, possibly. Pt received 3mg  iv narcan and 2mg  intranasal.

## 2017-05-22 NOTE — ED Notes (Signed)
Pt arrived to ED covered in copious amounts of large stool. Pt unaware of stool. Pt also projectile vomiting across the room at this time.

## 2017-05-22 NOTE — Progress Notes (Signed)
Pharmacy Antibiotic Note  Nicole KindredBradley S Clugston is a 42 y.o. male admitted on 05/22/2017 with pneumonia.  Pharmacy has been consulted for zosyn dosing.  Plan: Zosyn 3.375g IV q8h (4 hour infusion).     No data recorded.  Recent Labs  Lab 05/22/17 0431 05/22/17 0518  WBC 30.4*  --   CREATININE 1.11  --   LATICACIDVEN  --  3.6*    Estimated Creatinine Clearance: 83.2 mL/min (by C-G formula based on SCr of 1.11 mg/dL).    Allergies  Allergen Reactions  . Tramadol Itching  . Naproxen     Itching     Thank you for allowing pharmacy to be a part of this patient's care.  Thomasene Rippleavid Estalee Mccandlish, PharmD, BCPS Clinical Pharmacist 05/22/2017

## 2017-05-22 NOTE — ED Notes (Signed)
Received report from Rebecca,RN.

## 2017-05-22 NOTE — ED Provider Notes (Signed)
[facility] Emergency Department Provider Note   ____________________________________________   First MD Initiated Contact with Patient 05/22/17 0424     (approximate)  I have reviewed the triage vital signs and the nursing notes.   HISTORY  Chief Complaint Drug Overdose  History obtained by EMS  HPI Philip Owen is a 42 y.o. male who comes into the hospital today with an overdose.  According to EMS the patient was found unresponsive when they arrived.  His brother called because he found him on the floor.  EMS said that the patient had some agonal respirations and pinpoint pupils.  They gave him 2 mg of Narcan intranasally.  They state that the patient aroused slightly but was still very somnolent.  They gave him 2 mg then IV and states that he woke up even more but then again was still very somnolent.  They gave the patient another milligram IV and states that he became very shaky and agitated I did ask the patient what happened and he did not respond he is just very shaky.  Past Medical History:  Diagnosis Date  . Acid reflux   . Anxiety   . GERD (gastroesophageal reflux disease)     There are no active problems to display for this patient.   No past surgical history on file.  Prior to Admission medications   Medication Sig Start Date End Date Taking? Authorizing Provider  predniSONE (STERAPRED UNI-PAK 21 TAB) 10 MG (21) TBPK tablet Take 6 pills on day one then decrease by 1 pill each day 05/14/17   Faythe Ghee, PA-C  sulfamethoxazole-trimethoprim (BACTRIM DS,SEPTRA DS) 800-160 MG tablet Take 1 tablet by mouth 2 (two) times daily. 05/14/17   Faythe Ghee, PA-C    Allergies Tramadol and Naproxen  No family history on file.  Social History Social History   Tobacco Use  . Smoking status: Current Every Day Smoker    Packs/day: 0.50  . Smokeless tobacco: Never Used  Substance Use Topics  . Alcohol use: No  . Drug use: Yes   Types: "Crack" cocaine    Review of Systems  Constitutional: No fever/chills Eyes: No visual changes. ENT: No sore throat. Cardiovascular: Denies chest pain. Respiratory: Denies shortness of breath. Gastrointestinal: No abdominal pain.  No nausea, no vomiting.  No diarrhea.  No constipation. Genitourinary: Negative for dysuria. Musculoskeletal: Negative for back pain. Skin: Negative for rash. Neurological: Negative for headaches, focal weakness or numbness.   ____________________________________________   PHYSICAL EXAM:  VITAL SIGNS: ED Triage Vitals [05/22/17 0426]  Enc Vitals Group     BP (!) 141/91     Pulse Rate (!) 146     Resp (!) 30     Temp      Temp src      SpO2 100 %     Weight      Height      Head Circumference      Peak Flow      Pain Score      Pain Loc      Pain Edu?      Excl. in GC?     Constitutional: Alert and oriented.  Patient agitated and shaky and some moderate distress Eyes: Conjunctivae are normal. PERRL. EOMI. Head: Atraumatic. Nose: No congestion/rhinnorhea. Mouth/Throat: Mucous membranes are moist.  Oropharynx non-erythematous. Cardiovascular: Tachycardia regular rhythm. Grossly normal heart sounds.  Good peripheral circulation. Respiratory: Increased respiratory effort.  No retractions. Lungs CTAB. Gastrointestinal: Soft and nontender.  No distention. No abdominal bruits. No CVA tenderness. Musculoskeletal: No lower extremity tenderness nor edema.   Neurologic:  Normal speech and language.  Skin:  Skin is warm, dry and intact.  Psychiatric: Mood and affect are normal.   ____________________________________________   LABS (all labs ordered are listed, but only abnormal results are displayed)  Labs Reviewed  CBC - Abnormal; Notable for the following components:      Result Value   WBC 30.4 (*)    All other components within normal limits  COMPREHENSIVE METABOLIC PANEL - Abnormal; Notable for the following components:    Chloride 100 (*)    Glucose, Bld 143 (*)    BUN 21 (*)    All other components within normal limits  URINE DRUG SCREEN, QUALITATIVE ([facility] ONLY) - Abnormal; Notable for the following components:   Cocaine Metabolite,Ur [place] POSITIVE (*)    Opiate, Ur Screen POSITIVE (*)    Cannabinoid 50 Ng, Ur [place] POSITIVE (*)    All other components within normal limits  ACETAMINOPHEN LEVEL - Abnormal; Notable for the following components:   Acetaminophen (Tylenol), Serum <10 (*)    All other components within normal limits  LACTIC ACID, PLASMA - Abnormal; Notable for the following components:   Lactic Acid, Venous 3.6 (*)    All other components within normal limits  CULTURE, BLOOD (ROUTINE X 2)  CULTURE, BLOOD (ROUTINE X 2)  SALICYLATE LEVEL  LACTIC ACID, PLASMA  TYPE AND SCREEN  TYPE AND SCREEN   ____________________________________________  EKG  ED ECG REPORT I, Rebecka Apley, the attending physician, personally viewed and interpreted this ECG.   Date: 05/22/2017  EKG Time: 502  Rate: 123  Rhythm: sinus tachycardia  Axis: normal  Intervals:none  ST&T Change: none  ____________________________________________  RADIOLOGY  Dg Chest Port 1 View  Result Date: 05/22/2017 CLINICAL DATA:  Patient was found on the floor. Possible opiate overdose. EXAM: PORTABLE CHEST 1 VIEW COMPARISON:  05/03/2017 FINDINGS: Shallow inspiration. Increasing infiltration in the right lung base consistent with progression of pneumonia. Left lung is clear. Heart size and pulmonary vascularity are normal. No blunting of costophrenic angles. No pneumothorax. Mediastinal contours appear intact. IMPRESSION: Increasing infiltration in the right lung base since previous study suggesting progression of pneumonia. Electronically Signed   By: Burman Nieves M.D.   On: 05/22/2017 05:19    ____________________________________________   PROCEDURES  Procedure(s) performed: please, see procedure note(s).  .Critical  Care Performed by: Rebecka Apley, MD Authorized by: Rebecka Apley, MD   Critical care provider statement:    Critical care time (minutes):  30   Critical care start time:  05/22/2017 4:24 AM   Critical care end time:  05/22/2017 4:54 AM   Critical care time was exclusive of:  Separately billable procedures and treating other patients   Critical care was necessary to treat or prevent imminent or life-threatening deterioration of the following conditions:  Circulatory failure, respiratory failure and dehydration   Critical care was time spent personally by me on the following activities:  Development of treatment plan with patient or surrogate, evaluation of patient's response to treatment, examination of patient, obtaining history from patient or surrogate, ordering and performing treatments and interventions, ordering and review of laboratory studies, ordering and review of radiographic studies, pulse oximetry, re-evaluation of patient's condition, review of old charts and discussions with consultants   I assumed direction of critical care for this patient from another provider in my specialty: no  Critical Care performed: Yes, see critical care note(s)  ____________________________________________   INITIAL IMPRESSION / ASSESSMENT AND PLAN / ED COURSE  As part of my medical decision making, I reviewed the following data within the electronic MEDICAL RECORD NUMBER Notes from prior ED visits and  Controlled Substance Database   This is a 42 year old male who comes in with what appears to be an opiate overdose.  He arrived and was very shaky and then promptly vomited black appearing material all over the room.  I will give the patient a liter of normal saline as well as some Zofran.  We will continue to monitor the patient.    Sinus tachycardia improved and his drug screen was positive for opiates cocaine and cannabinoids.  The patient's chest x-ray showed increasing infiltration  in the right lung base suggesting progression of pneumonia.  The patient did vomit extensively so there is a concern also for aspiration pneumonia.  I will give the patient a dose of Zosyn and vancomycin.  The patient's lactic acid is 3.6.  His white blood cell count is 30.4.  He will be admitted to the hospitalist service for further evaluation and treatment of his pneumonia.  ____________________________________________   FINAL CLINICAL IMPRESSION(S) / ED DIAGNOSES  Final diagnoses:  Opiate overdose, undetermined intent, initial encounter (HCC)  Aspiration pneumonia of right lower lobe due to vomit (HCC)  Hypoxia  Tachycardia     ED Discharge Orders    None       Note:  This document was prepared using Dragon voice recognition software and may include unintentional dictation errors.   Rebecka ApleyWebster, Allison P, MD 05/22/17 [phone]

## 2017-05-23 LAB — BASIC METABOLIC PANEL
Anion gap: 6 (ref 5–15)
BUN: 10 mg/dL (ref 6–20)
CALCIUM: 8.6 mg/dL — AB (ref 8.9–10.3)
CHLORIDE: 105 mmol/L (ref 101–111)
CO2: 25 mmol/L (ref 22–32)
CREATININE: 0.88 mg/dL (ref 0.61–1.24)
GFR calc non Af Amer: >60 mL/min (ref >60)
Glucose, Bld: 129 mg/dL — ABNORMAL HIGH (ref 65–99)
Potassium: 3.9 mmol/L (ref 3.5–5.1)
SODIUM: 136 mmol/L (ref 135–145)

## 2017-05-23 LAB — CBC
HCT: 34.1 % — ABNORMAL LOW (ref 40.0–52.0)
Hemoglobin: 11.5 g/dL — ABNORMAL LOW (ref 13.0–18.0)
MCH: 29.4 pg (ref 26.0–34.0)
MCHC: 33.7 g/dL (ref 32.0–36.0)
MCV: 87.2 fL (ref 80.0–100.0)
Platelets: 261 10*3/uL (ref 150–440)
RBC: 3.91 MIL/uL — ABNORMAL LOW (ref 4.40–5.90)
RDW: 13.4 % (ref 11.5–14.5)
WBC: 17.1 10*3/uL — ABNORMAL HIGH (ref 3.8–10.6)

## 2017-05-23 LAB — HIV ANTIBODY (ROUTINE TESTING W REFLEX): HIV SCREEN 4TH GENERATION: NONREACTIVE

## 2017-05-23 MED ORDER — PANTOPRAZOLE SODIUM 40 MG PO TBEC: 40.0000 mg | DELAYED_RELEASE_TABLET | Freq: Every day | ORAL | 0 refills | Status: DC

## 2017-05-23 MED ORDER — AMOXICILLIN-POT CLAVULANATE 875-125 MG PO TABS: 1.0000 | ORAL_TABLET | Freq: Two times a day (BID) | ORAL | 0 refills | Status: AC

## 2017-05-23 MED ORDER — NALOXONE HCL 4 MG/0.1ML NA LIQD: NASAL | 1 refills | Status: DC

## 2017-05-26 NOTE — Discharge Summary (Signed)
Philip Owen, is a 42 y.o. male  DOB 12/05/1975  MRN [id number].  Admission date:  05/22/2017  Admitting Physician  Saundra Shelling, MD  Discharge Date:  05/23/2017   Primary MD  Lorelee Market, MD  Recommendations for primary care physician for things to follow:   Follow-up with PCP in 1 week   Admission Diagnosis  Shortness of breath [R06.02] Tachycardia [R00.0] Hypoxia [R09.02] Opiate overdose, undetermined intent, initial encounter ([place]) [T40.604A] Aspiration pneumonia of right lower lobe due to vomit ([place]) [J69.0]   Discharge Diagnosis  Shortness of breath [R06.02] Tachycardia [R00.0] Hypoxia [R09.02] Opiate overdose, undetermined intent, initial encounter ([place]) [T40.604A] Aspiration pneumonia of right lower lobe due to vomit ([place]) [J69.0]    Active Problems:   Pneumonia      Past Medical History:  Diagnosis Date  . Acid reflux   . Anxiety   . GERD (gastroesophageal reflux disease)     Past Surgical History:  Procedure Laterality Date  . none         History of present illness and  Hospital Course:     Kindly see H&P for history of present illness and admission details, please review complete Labs, Consult reports and Test reports for all details in brief  HPI  from the history and physical done on the day of admission 42 year old male patient with GERD, anxiety disorder came in because of unresponsiveness to the ER.  Patient's brother found him unresponsive so he called EMS.  In the ER patient received Narcan and he woke up immediately and patient x-ray showed pneumonia on the right side.  And he is admitted for sepsis secondary to pneumonia with elevated white count and, elevated lactic acid.   Hospital Course  #1 sepsis present on admission with evidence of pneumonia, elevated lactic  acid, white count: Received aggressive hydration, IV antibiotics Zosyn, following day lactic acid number decreased from 3.6-1.5.  Cultures have been negative for 4 days.  Discharged home Augmentin for 2 weeks.,  Patient tolerated a regular diet . 2.  Accidental drug overdose with opiates, patient said that he never takes drugs and this is one time he took it by accident he has no suicidal or homicidal ideation.     Discharge Condition: Stable   Follow UP  Follow-up Information    Lorelee Market, MD. Go on 05/30/2017.   Specialty:  Family Medicine Why:  @10 :00 AM Contact information: [place] [address] [phone]             Discharge Instructions  and  Discharge Medications      Allergies as of 05/23/2017      Reactions   Tramadol Itching   Naproxen    Itching       Medication List    STOP taking these medications   amoxicillin 500 MG capsule Commonly known as:  AMOXIL   ibuprofen 800 MG tablet Commonly known as:  ADVIL,MOTRIN   sulfamethoxazole-trimethoprim 800-160 MG tablet Commonly known as:  BACTRIM DS,SEPTRA DS     TAKE these medications   amoxicillin-clavulanate 875-125 MG tablet Commonly known as:  AUGMENTIN Take 1 tablet by mouth 2 (two) times daily for 14 days.   naloxone 4 MG/0.1ML Liqd nasal spray kit Commonly known as:  NARCAN Spray the contents of 1 nasal spray as a single dose in one nostril; may repeat every 2 to 3 minutes in alternating nostrils until medical assistance becomes available   ondansetron 8 MG tablet Commonly known as:  ZOFRAN Take 1 tablet by mouth every 8 (eight) hours as needed.   pantoprazole 40 MG tablet Commonly known as:  PROTONIX Take 1 tablet (40 mg total) by mouth daily.         Diet and Activity recommendation: See Discharge Instructions above   Consults obtained -none    Major procedures and Radiology Reports - PLEASE review detailed and final reports for all details, in brief -       Dg Chest 2 View  Result Date: 05/03/2017 CLINICAL DATA:  Fever and productive cough. EXAM: CHEST  2 VIEW COMPARISON:  Chest x-ray 02/15/2017 FINDINGS: The cardiac silhouette, mediastinal and hilar contours are within normal limits and stable. There is a infiltrate in the medial basal segment of the right lower lobe. Densities associated with the left ribs are consistent with remote healed fractures with callus formation. No pleural effusions. IMPRESSION: Right lower lobe pneumonia. Electronically Signed   By: Marijo Sanes M.D.   On: 05/03/2017 08:53   Dg Chest Port 1 View  Result Date: 05/22/2017 CLINICAL DATA:  Patient was found on the floor. Possible opiate overdose. EXAM: PORTABLE CHEST 1 VIEW COMPARISON:  05/03/2017 FINDINGS: Shallow inspiration. Increasing infiltration in the right lung base consistent with progression of pneumonia. Left lung is clear. Heart size and pulmonary vascularity are normal. No blunting of costophrenic angles. No pneumothorax. Mediastinal contours appear intact. IMPRESSION: Increasing infiltration in the right lung base since previous study suggesting progression of pneumonia. Electronically Signed   By: Lucienne Capers M.D.   On: 05/22/2017 05:19    Micro Results    Recent Results (from the past 240 hour(s))  Blood culture (routine x 2)     Status: None (Preliminary result)   Collection Time: 05/22/17  5:45 AM  Result Value Ref Range Status   Specimen Description BLOOD RIGHT [facility]  Final   Special Requests   Final    BOTTLES DRAWN AEROBIC AND ANAEROBIC Blood Culture adequate volume   Culture   Final    NO GROWTH 4 DAYS Performed at [facility], [address] [place], [place] [address]    Report Status PENDING  Incomplete  Blood culture (routine x 2)     Status: None (Preliminary result)   Collection Time: 05/22/17  5:45 AM  Result Value Ref Range Status   Specimen Description BLOOD RIGHT HAND  Final   Special Requests   Final     BOTTLES DRAWN AEROBIC AND ANAEROBIC Blood Culture adequate volume   Culture   Final    NO GROWTH 4 DAYS Performed at [facility], [address], [place], [place] [address]    Report Status PENDING  Incomplete       Today   Subjective:   Philip Owen today has no headache,no chest abdominal pain,no new weakness tingling or numbness, feels much better wants to go home today.   Objective:   Blood pressure 127/75, pulse 85, temperature 98 F (36.7 C), temperature source Oral, resp. rate 16, height 5' 8"  (1.727 m), weight 64.7 kg (142 lb 11.2 oz), SpO2 97 %.  No intake or output data in the 24 hours ending 05/26/17 1542  Exam Awake Alert, Oriented x 3, No new F.N deficits, Normal affect [place].AT,PERRAL Supple Neck,No JVD, No cervical lymphadenopathy appriciated.  Symmetrical Chest wall movement, Good air movement bilaterally, CTAB RRR,No Gallops,Rubs or new Murmurs, No Parasternal Heave +ve B.Sounds, Abd Soft, Non tender, No organomegaly appriciated, No rebound -guarding or rigidity. No Cyanosis, Clubbing  or edema, No new Rash or bruise  Data Review   CBC w Diff:  Lab Results  Component Value Date   WBC 17.1 (H) 05/23/2017   HGB 11.5 (L) 05/23/2017   HGB 12.0 (L) 01/02/2013   HCT 34.1 (L) 05/23/2017   HCT 34.6 (L) 01/02/2013   PLT 261 05/23/2017   PLT 633 (H) 01/02/2013   LYMPHOPCT 20.4 01/02/2013   MONOPCT 5.0 01/02/2013   EOSPCT 2.3 01/02/2013   BASOPCT 0.7 01/02/2013    CMP:  Lab Results  Component Value Date   NA 136 05/23/2017   NA 139 01/02/2013   K 3.9 05/23/2017   K 3.7 01/02/2013   CL 105 05/23/2017   CL 107 01/02/2013   CO2 25 05/23/2017   CO2 22 01/02/2013   BUN 10 05/23/2017   BUN 9 01/02/2013   CREATININE 0.88 05/23/2017   CREATININE 0.62 01/02/2013   PROT 7.8 05/22/2017   PROT 5.6 (L) 12/21/2012   ALBUMIN 4.4 05/22/2017   ALBUMIN 1.6 (L) 12/21/2012   BILITOT 0.5 05/22/2017   BILITOT 0.4 12/21/2012   ALKPHOS 85 05/22/2017    ALKPHOS 219 (H) 12/21/2012   AST 31 05/22/2017   AST 31 12/21/2012   ALT 30 05/22/2017   ALT 32 12/21/2012  .   Total Time in preparing paper work, data evaluation and todays exam - 35 minutes  Epifanio Lesches M.D on 05/23/2017 at 3:42 PM    Note: This dictation was prepared with Dragon dictation along with smaller phrase technology. Any transcriptional errors that result from this process are unintentional.

## 2017-05-27 LAB — CULTURE, BLOOD (ROUTINE X 2)
Culture: NO GROWTH
Culture: NO GROWTH
Special Requests: ADEQUATE
Special Requests: ADEQUATE

## 2017-11-21 ENCOUNTER — Emergency Department
Admission: EM | Admit: 2017-11-21 | Discharge: 2017-11-21 | Disposition: A | Discharge status: Home / Self Care | Payer: Self-pay | Attending: Emergency Medicine | Admitting: Emergency Medicine

## 2017-11-21 ENCOUNTER — Other Ambulatory Visit: Payer: Self-pay

## 2017-11-21 ENCOUNTER — Encounter: Payer: Self-pay | Admitting: Emergency Medicine

## 2017-11-21 ENCOUNTER — Emergency Department: Payer: Self-pay

## 2017-11-21 DIAGNOSIS — Z79899 Other long term (current) drug therapy: Secondary | ICD-10-CM | POA: Insufficient documentation

## 2017-11-21 DIAGNOSIS — B9789 Other viral agents as the cause of diseases classified elsewhere: Secondary | ICD-10-CM | POA: Insufficient documentation

## 2017-11-21 DIAGNOSIS — J069 Acute upper respiratory infection, unspecified: Secondary | ICD-10-CM | POA: Insufficient documentation

## 2017-11-21 DIAGNOSIS — F17200 Nicotine dependence, unspecified, uncomplicated: Secondary | ICD-10-CM | POA: Insufficient documentation

## 2017-11-21 MED ORDER — PSEUDOEPH-BROMPHEN-DM 30-2-10 MG/5ML PO SYRP: 5.0000 mL | ORAL_SOLUTION | Freq: Four times a day (QID) | ORAL | 0 refills | Status: DC | PRN

## 2017-11-21 NOTE — ED Triage Notes (Signed)
Productive cough x 3 days.  Also c/o core throat and fever over night.

## 2017-11-21 NOTE — Discharge Instructions (Addendum)
Follow discharge care instruction take medication as directed. °

## 2017-11-21 NOTE — ED Provider Notes (Signed)
[facility] Emergency Department Provider Note   ____________________________________________   First MD Initiated Contact with Patient 11/21/17 0848     (approximate)  I have reviewed the triage vital signs and the nursing notes.   HISTORY  Chief Complaint Cough    HPI Philip Owen is a 42 y.o. male patient complain of a greenish productive cough for 3 days.  Patient also states subjective fever last night.  Patient had episode of vomiting secondary to coughing.  Patient state mild sore throat.  Patient denies diarrhea or constipation.  Patient is using over-the-counter medication with mild transient relief.  Rates his pain as a 6/10.  Describes his pain as "achy". Past Medical History:  Diagnosis Date  . Acid reflux   . Anxiety   . GERD (gastroesophageal reflux disease)     Patient Active Problem List   Diagnosis Date Noted  . Pneumonia 05/22/2017    Past Surgical History:  Procedure Laterality Date  . none      Prior to Admission medications   Medication Sig Start Date End Date Taking? Authorizing Provider  esomeprazole (NEXIUM) 20 MG capsule Take 20 mg by mouth daily at 12 noon.   Yes [provider]  brompheniramine-pseudoephedrine-DM 30-2-10 MG/5ML syrup Take 5 mLs by mouth 4 (four) times daily as needed. 11/21/17   Joni ReiningSmith, Ronald K, PA-C  naloxone [facility](NARCAN) nasal spray 4 mg/0.1 mL Spray the contents of 1 nasal spray as a single dose in one nostril; may repeat every 2 to 3 minutes in alternating nostrils until medical assistance becomes available 05/23/17   Katha HammingKonidena, Snehalatha, MD  ondansetron (ZOFRAN) 8 MG tablet Take 1 tablet by mouth every 8 (eight) hours as needed. 05/03/17   [provider]  pantoprazole (PROTONIX) 40 MG tablet Take 1 tablet (40 mg total) by mouth daily. 05/24/17   Katha HammingKonidena, Snehalatha, MD    Allergies Tramadol and Naproxen  Family History  Problem Relation Age of Onset  . Brain cancer Father      Social History Social History   Tobacco Use  . Smoking status: Current Every Day Smoker    Packs/day: 0.50  . Smokeless tobacco: Never Used  Substance Use Topics  . Alcohol use: No  . Drug use: Yes    Types: Cocaine    Review of Systems Constitutional: No fever/chills Eyes: No visual changes. ENT: No sore throat. Cardiovascular: Denies chest pain. Respiratory: Denies shortness of breath. Gastrointestinal: No abdominal pain.  No nausea, no vomiting.  No diarrhea.  No constipation.  History of GERD. Genitourinary: Negative for dysuria. Musculoskeletal: Negative for back pain. Skin: Negative for rash. Neurological: Negative for headaches, focal weakness or numbness. Psychiatric:Anxiety Allergic/Immunilogical: Tramadol and naproxen. ____________________________________________   PHYSICAL EXAM:  VITAL SIGNS: ED Triage Vitals  Enc Vitals Group     BP 11/21/17 0831 (!) 154/104     Pulse Rate 11/21/17 0831 75     Resp --      Temp 11/21/17 0831 98.3 F (36.8 C)     Temp Source 11/21/17 0831 Oral     SpO2 11/21/17 0831 97 %     Weight 11/21/17 0841 165 lb (74.8 kg)     Height 11/21/17 0841 5\' 8"  (1.727 m)     Head Circumference --      Peak Flow --      Pain Score 11/21/17 0840 6     Pain Loc --      Pain Edu? --  Excl. in GC? --    Constitutional: Alert and oriented. Well appearing and in no acute distress. Nose: No congestion/rhinnorhea. Mouth/Throat: Mucous membranes are moist.  Oropharynx non-erythematous. Neck: No stridor. Hematological/Lymphatic/Immunilogical: No cervical lymphadenopathy. Cardiovascular: Normal rate, regular rhythm. Grossly normal heart sounds.  Good peripheral circulation.  Elevated blood pressure Respiratory: Normal respiratory effort.  No retractions. Lungs CTAB. Gastrointestinal: Soft and nontender. No distention. No abdominal bruits. No CVA tenderness. Neurologic:  Normal speech and language. No gross focal neurologic deficits are  appreciated. No gait instability. Skin:  Skin is warm, dry and intact. No rash noted. Psychiatric: Mood and affect are normal. Speech and behavior are normal.  ____________________________________________   LABS (all labs ordered are listed, but only abnormal results are displayed)  Labs Reviewed - No data to display ____________________________________________  EKG   ____________________________________________  RADIOLOGY  ED MD interpretation:    Official radiology report(s): Dg Chest 2 View  Result Date: 11/21/2017 CLINICAL DATA:  Productive cough EXAM: CHEST - 2 VIEW COMPARISON:  05/22/2017 FINDINGS: Heart and mediastinal contours are within normal limits. No focal opacities or effusions. No acute bony abnormality. IMPRESSION: No active cardiopulmonary disease. Electronically Signed   By: Charlett Nose M.D.   On: 11/21/2017 09:14    ____________________________________________   PROCEDURES  Procedure(s) performed: None  Procedures  Critical Care performed: No  ____________________________________________   INITIAL IMPRESSION / ASSESSMENT AND PLAN / ED COURSE  As part of my medical decision making, I reviewed the following data within the electronic MEDICAL RECORD NUMBER    Productive cough secondary to viral respiratory infection.  Discussed negative chest x-ray findings with patient.  Patient given discharge care instruction advised take medication as directed.  Patient advised follow-up with PCP.      ____________________________________________   FINAL CLINICAL IMPRESSION(S) / ED DIAGNOSES  Final diagnoses:  Viral URI with cough     ED Discharge Orders        Ordered    brompheniramine-pseudoephedrine-DM 30-2-10 MG/5ML syrup  4 times daily PRN     11/21/17 0920       Note:  This document was prepared using Dragon voice recognition software and may include unintentional dictation errors.    Joni Reining, PA-C 11/21/17 [id number]    Jene Every, MD 11/21/17 [phone]

## 2017-11-21 NOTE — ED Notes (Signed)
Says coughing up green phlegm for 3 days.  Says chest feels inflamed now.  Patient in nad.

## 2018-02-08 ENCOUNTER — Emergency Department
Admission: EM | Admit: 2018-02-08 | Discharge: 2018-02-08 | Disposition: A | Discharge status: Home / Self Care | Payer: Medicaid Other | Attending: Emergency Medicine | Admitting: Emergency Medicine

## 2018-02-08 ENCOUNTER — Encounter: Payer: Self-pay | Admitting: Intensive Care

## 2018-02-08 ENCOUNTER — Other Ambulatory Visit: Payer: Self-pay

## 2018-02-08 DIAGNOSIS — Z79899 Other long term (current) drug therapy: Secondary | ICD-10-CM | POA: Insufficient documentation

## 2018-02-08 DIAGNOSIS — F17200 Nicotine dependence, unspecified, uncomplicated: Secondary | ICD-10-CM | POA: Insufficient documentation

## 2018-02-08 DIAGNOSIS — J0110 Acute frontal sinusitis, unspecified: Secondary | ICD-10-CM | POA: Insufficient documentation

## 2018-02-08 DIAGNOSIS — R51 Headache: Secondary | ICD-10-CM | POA: Insufficient documentation

## 2018-02-08 DIAGNOSIS — R519 Headache, unspecified: Secondary | ICD-10-CM

## 2018-02-08 MED ORDER — PREDNISONE 20 MG PO TABS: 40.0000 mg | ORAL_TABLET | Freq: Every day | ORAL | 0 refills | Status: DC

## 2018-02-08 MED ORDER — AMOXICILLIN-POT CLAVULANATE 875-125 MG PO TABS: 1.0000 | ORAL_TABLET | Freq: Two times a day (BID) | ORAL | 0 refills | Status: AC

## 2018-02-08 MED ORDER — LORATADINE-PSEUDOEPHEDRINE ER 5-120 MG PO TB12: 1.0000 | ORAL_TABLET | Freq: Two times a day (BID) | ORAL | 0 refills | Status: AC

## 2018-02-08 NOTE — ED Notes (Signed)
Pt is ambulatory to POV without difficulty. VSS. NAD. Discharge instructions, RX, and follow up discussed. All questions and concerns addressed.

## 2018-02-08 NOTE — ED Triage Notes (Signed)
Patient c/o headache and emesis X1. Ambulatory in triage with no problems. Denies light or noise sensitivity. Also c/o popping ears. Denies pain or fluid from ears.

## 2018-02-08 NOTE — ED Provider Notes (Signed)
[facility] EMERGENCY DEPARTMENT Provider Note   CSN: [id number] Arrival date & time: 02/08/18  1741     History   Chief Complaint Chief Complaint  Patient presents with  . Headache    HPI Philip Owen is a 42 y.o. male.  Presents to the emergency department for evaluation of headache, popping in his ears, intermittent episodes of dizziness.  Patient complains of constant frontal sinus aching that is increased with looking down and to touch along the frontal sinus.  He denies any fevers or trauma.  He feels as if his hearing is decreased and muffled and he has had episodes of dizziness today.  He is currently without any dizziness or lightheadedness.  Patient states his dizziness was worse as he is moving his head but as he was still there was no dizziness.  He denies having any fevers.  He has had 1 week of congestion without taking any medications.  He denies any sore throat, nausea vomiting or diarrhea.  No photophobia or vision changes. HPI  Past Medical History:  Diagnosis Date  . Acid reflux   . Anxiety   . GERD (gastroesophageal reflux disease)     Patient Active Problem List   Diagnosis Date Noted  . Pneumonia 05/22/2017    Past Surgical History:  Procedure Laterality Date  . none          Home Medications    Prior to Admission medications   Medication Sig Start Date End Date Taking? Authorizing Provider  amoxicillin-clavulanate (AUGMENTIN) 875-125 MG tablet Take 1 tablet by mouth every 12 (twelve) hours for 10 days. 02/08/18 02/18/18  Evon Slack, PA-C  brompheniramine-pseudoephedrine-DM 30-2-10 MG/5ML syrup Take 5 mLs by mouth 4 (four) times daily as needed. 11/21/17   Joni Reining, PA-C  esomeprazole (NEXIUM) 20 MG capsule Take 20 mg by mouth daily at 12 noon.    [provider]  loratadine-pseudoephedrine (CLARITIN-D 12 HOUR) 5-120 MG tablet Take 1 tablet by mouth 2 (two) times daily. 02/08/18 02/08/19  Evon Slack, PA-C  naloxone [facility]) nasal spray 4 mg/0.1 mL Spray the contents of 1 nasal spray as a single dose in one nostril; may repeat every 2 to 3 minutes in alternating nostrils until medical assistance becomes available 05/23/17   Katha Hamming, MD  ondansetron (ZOFRAN) 8 MG tablet Take 1 tablet by mouth every 8 (eight) hours as needed. 05/03/17   [provider]  pantoprazole (PROTONIX) 40 MG tablet Take 1 tablet (40 mg total) by mouth daily. 05/24/17   Katha Hamming, MD  predniSONE (DELTASONE) 20 MG tablet Take 2 tablets (40 mg total) by mouth daily. 02/08/18   Evon Slack, PA-C    Family History Family History  Problem Relation Age of Onset  . Brain cancer Father     Social History Social History   Tobacco Use  . Smoking status: Current Every Day Smoker    Packs/day: 0.50  . Smokeless tobacco: Never Used  Substance Use Topics  . Alcohol use: No  . Drug use: Yes    Types: Cocaine     Allergies   Tramadol and Naproxen   Review of Systems Review of Systems  Constitutional: Negative for fatigue and fever.  HENT: Positive for congestion, sinus pressure and sinus pain. Negative for sneezing, sore throat and trouble swallowing.   Eyes: Negative for discharge, redness and itching.  Respiratory: Negative for shortness of breath.   Cardiovascular: Negative for chest pain.  Musculoskeletal:  Negative for myalgias.  Skin: Negative for rash and wound.  Neurological: Negative for dizziness and headaches.     Physical Exam Updated Vital Signs BP 140/89 (BP Location: Left Arm)   Pulse 70   Temp 97.7 F (36.5 C) (Oral)   Resp 14   Ht 5\' 8"  (1.727 m)   Wt 72.6 kg   SpO2 98%   BMI 24.33 kg/m   Physical Exam  Constitutional: He is oriented to person, place, and time. He appears well-developed and well-nourished.  HENT:  Head: Normocephalic and atraumatic.  Right Ear: External ear normal.  Left Ear: External ear normal.  Nose: Nose normal.    Mouth/Throat: Oropharynx is clear and moist. No oropharyngeal exudate.  Positive frontal sinus tenderness palpation.  Pain is increased with looking down.  No sign of trauma.  Both TMs show fluid present, fluid is clear with no redness noted.  Canals are normal.  Eyes: Conjunctivae are normal. Right eye exhibits no discharge. Left eye exhibits no discharge.  Neck: Normal range of motion.  Cardiovascular: Normal rate.  Pulmonary/Chest: Effort normal. No stridor. No respiratory distress. He has no wheezes.  Musculoskeletal: Normal range of motion.  Lymphadenopathy:    He has no cervical adenopathy.  Neurological: He is alert and oriented to person, place, and time. He has normal strength. He is not disoriented. No cranial nerve deficit or sensory deficit. He displays a negative Romberg sign. Coordination and gait normal.  Skin: Skin is warm. No rash noted.  Psychiatric: He has a normal mood and affect. His behavior is normal.     ED Treatments / Results  Labs (all labs ordered are listed, but only abnormal results are displayed) Labs Reviewed - No data to display  EKG None  Radiology No results found.  Procedures Procedures (including critical care time)  Medications Ordered in ED Medications - No data to display   Initial Impression / Assessment and Plan / ED Course  I have reviewed the triage vital signs and the nursing notes.  Pertinent labs & imaging results that were available during my care of the patient were reviewed by me and considered in my medical decision making (see chart for details).     42 year old male with history of congestion with pressure behind the ears with muffled hearing and intermittent episodes of dizziness.  His headache is located along the frontal sinus and tender to palpation and reproducible with leaning forward.  His neuro exam is normal vital signs are stable.  He is placed on decongestant, prednisone and antibiotic for acute frontal  maxillary sinusitis.  He understands signs symptoms return to ED for.  Final Clinical Impressions(s) / ED Diagnoses   Final diagnoses:  Sinus headache  Acute non-recurrent frontal sinusitis    ED Discharge Orders         Ordered    amoxicillin-clavulanate (AUGMENTIN) 875-125 MG tablet  Every 12 hours     02/08/18 1922    loratadine-pseudoephedrine (CLARITIN-D 12 HOUR) 5-120 MG tablet  2 times daily     02/08/18 1922    predniSONE (DELTASONE) 20 MG tablet  Daily     02/08/18 1922           Evon Slack, [place] 02/08/18 Lianne Cure, [place], MD 02/13/18 [phone]

## 2018-02-08 NOTE — ED Notes (Signed)
FIRST NURSE NOTE:  Pt arrived via EMS with reports of N/V, dizziness and HA that started about 45 minutes ago, VS 154/100 p-80 RR-16  97% CBG = 86  Temp 98.1

## 2018-02-08 NOTE — Discharge Instructions (Addendum)
Please take medication as prescribed.  If any fevers increasing pain, new symptoms or any urgent changes in your health return to the emergency department.

## 2018-03-13 ENCOUNTER — Emergency Department: Payer: Medicaid Other

## 2018-03-13 ENCOUNTER — Emergency Department
Admission: EM | Admit: 2018-03-13 | Discharge: 2018-03-13 | Disposition: A | Discharge status: Home / Self Care | Payer: Medicaid Other | Attending: Emergency Medicine | Admitting: Emergency Medicine

## 2018-03-13 ENCOUNTER — Other Ambulatory Visit: Payer: Self-pay

## 2018-03-13 DIAGNOSIS — G4733 Obstructive sleep apnea (adult) (pediatric): Secondary | ICD-10-CM

## 2018-03-13 DIAGNOSIS — Z79899 Other long term (current) drug therapy: Secondary | ICD-10-CM | POA: Insufficient documentation

## 2018-03-13 DIAGNOSIS — Z23 Encounter for immunization: Secondary | ICD-10-CM | POA: Insufficient documentation

## 2018-03-13 DIAGNOSIS — Y939 Activity, unspecified: Secondary | ICD-10-CM | POA: Insufficient documentation

## 2018-03-13 DIAGNOSIS — Y929 Unspecified place or not applicable: Secondary | ICD-10-CM | POA: Insufficient documentation

## 2018-03-13 DIAGNOSIS — S61216A Laceration without foreign body of right little finger without damage to nail, initial encounter: Secondary | ICD-10-CM | POA: Insufficient documentation

## 2018-03-13 DIAGNOSIS — W25XXXA Contact with sharp glass, initial encounter: Secondary | ICD-10-CM | POA: Insufficient documentation

## 2018-03-13 DIAGNOSIS — Y998 Other external cause status: Secondary | ICD-10-CM | POA: Insufficient documentation

## 2018-03-13 DIAGNOSIS — F17200 Nicotine dependence, unspecified, uncomplicated: Secondary | ICD-10-CM | POA: Insufficient documentation

## 2018-03-13 MED ORDER — LIDOCAINE HCL (PF) 1 % IJ SOLN
5.0000 mL | Freq: Once | INTRAMUSCULAR | Status: AC
  Administered 2018-03-13: 5 mL via INTRADERMAL
  Filled 2018-03-13: qty 5

## 2018-03-13 MED ORDER — CEPHALEXIN 500 MG PO CAPS: 500.0000 mg | ORAL_CAPSULE | Freq: Three times a day (TID) | ORAL | 0 refills | Status: DC

## 2018-03-13 MED ORDER — TETANUS-DIPHTH-ACELL PERTUSSIS 5-2.5-18.5 LF-MCG/0.5 IM SUSP
0.5000 mL | Freq: Once | INTRAMUSCULAR | Status: AC
  Administered 2018-03-13: 0.5 mL via INTRAMUSCULAR
  Filled 2018-03-13: qty 0.5

## 2018-03-13 NOTE — ED Triage Notes (Signed)
Patient to RM 1 via EMS from home.  Per EMS patient with laceration to right 5th digit over 24 hours ago.  Patient had wrapped it with a band aid then electrical tape.  Patient is able to move finger and had good feeling to area.

## 2018-03-13 NOTE — ED Notes (Signed)
Pt snoring with eyes closed in stretcher. Pt arousable to physical stimuli.

## 2018-03-13 NOTE — Discharge Instructions (Signed)
The sutures in your finger laceration will fall apart on their own when it is time.  We noticed during your time in the ER that you have pretty severe sleep apnea.  Follow-up with your doctor for further evaluation.  It may be helpful to sleep on your side.

## 2018-03-13 NOTE — ED Notes (Signed)
Patient continues to rest quietly with eyes closed.  No acute distress noted.

## 2018-03-13 NOTE — ED Notes (Signed)
Patient resting quietly no acute distress noted.

## 2018-03-13 NOTE — ED Notes (Signed)
Wound dressed with sterile gauze. Pt alert and oriented X4, active, cooperative, pt in NAD. RR even and unlabored, color WNL.  Pt informed to return if any life threatening symptoms occur.  Discharge and followup instructions reviewed. Ambulates safely. Understands prescribed antibiotic directions.

## 2018-03-13 NOTE — ED Provider Notes (Signed)
[facility] Emergency Department Provider Note  ____________________________________________  Time seen: Approximately 5:59 AM  I have reviewed the triage vital signs and the nursing notes.   HISTORY  Chief Complaint Laceration    HPI Philip Owen is a 42 y.o. male with a history of anxiety and GERD who reports that he cut his right small finger on broken glass about 24 hours ago.  He wrapped it with tape but today decided that he should get it checked out.  Last tetanus shot was more than 5 years ago.  Denies any weakness or numbness in the hand or the finger.  No other injuries.  No fever.  Pain is constant, nonradiating, worse with finger movement, no alleviating factors.  Moderate intensity.      Past Medical History:  Diagnosis Date  . Acid reflux   . Anxiety   . GERD (gastroesophageal reflux disease)      Patient Active Problem List   Diagnosis Date Noted  . Pneumonia 05/22/2017     Past Surgical History:  Procedure Laterality Date  . none       Prior to Admission medications   Medication Sig Start Date End Date Taking? Authorizing Provider  brompheniramine-pseudoephedrine-DM 30-2-10 MG/5ML syrup Take 5 mLs by mouth 4 (four) times daily as needed. 11/21/17   Joni Reining, PA-C  cephALEXin (KEFLEX) 500 MG capsule Take 1 capsule (500 mg total) by mouth 3 (three) times daily. 03/13/18   Sharman Cheek, MD  esomeprazole (NEXIUM) 20 MG capsule Take 20 mg by mouth daily at 12 noon.    [provider]  loratadine-pseudoephedrine (CLARITIN-D 12 HOUR) 5-120 MG tablet Take 1 tablet by mouth 2 (two) times daily. 02/08/18 02/08/19  Evon Slack, PA-C  naloxone [facility]) nasal spray 4 mg/0.1 mL Spray the contents of 1 nasal spray as a single dose in one nostril; may repeat every 2 to 3 minutes in alternating nostrils until medical assistance becomes available 05/23/17   Katha Hamming, MD  ondansetron (ZOFRAN) 8 MG tablet Take  1 tablet by mouth every 8 (eight) hours as needed. 05/03/17   [provider]  pantoprazole (PROTONIX) 40 MG tablet Take 1 tablet (40 mg total) by mouth daily. 05/24/17   Katha Hamming, MD  predniSONE (DELTASONE) 20 MG tablet Take 2 tablets (40 mg total) by mouth daily. 02/08/18   Evon Slack, PA-C     Allergies Tramadol and Naproxen   Family History  Problem Relation Age of Onset  . Brain cancer Father     Social History Social History   Tobacco Use  . Smoking status: Current Every Day Smoker    Packs/day: 0.50  . Smokeless tobacco: Never Used  Substance Use Topics  . Alcohol use: No  . Drug use: Yes    Types: Cocaine    Review of Systems  Constitutional:   No fever or chills.  ENT:   No sore throat. No rhinorrhea. Cardiovascular:   No chest pain or syncope. Respiratory:   No dyspnea or cough. Gastrointestinal:   Negative for abdominal pain, vomiting and diarrhea.  Musculoskeletal:   Right finger pain as above All other systems reviewed and are negative except as documented above in ROS and HPI.  ____________________________________________   PHYSICAL EXAM:  VITAL SIGNS: ED Triage Vitals  Enc Vitals Group     BP 03/13/18 0302 (!) 151/87     Pulse Rate 03/13/18 0302 88     Resp --  Temp 03/13/18 0302 98.4 F (36.9 C)     Temp Source 03/13/18 0302 Oral     SpO2 03/13/18 0302 99 %     Weight 03/13/18 0303 160 lb (72.6 kg)     Height 03/13/18 0303 5\' 8"  (1.727 m)     Head Circumference --      Peak Flow --      Pain Score 03/13/18 0302 8     Pain Loc --      Pain Edu? --      Excl. in GC? --     Vital signs reviewed, nursing assessments reviewed.   Constitutional:   Alert and oriented. Non-toxic appearance. Eyes:   Conjunctivae are normal. EOMI. PERRL. ENT      Head:   Normocephalic and atraumatic.      Nose:   No congestion/rhinnorhea.       Mouth/Throat:   MMM, no pharyngeal erythema. No peritonsillar mass.       Neck:   No  meningismus. Full ROM. Hematological/Lymphatic/Immunilogical:   No cervical lymphadenopathy. Cardiovascular:   RRR. Symmetric bilateral radial and DP pulses.  No murmurs. Cap refill less than 2 seconds. Respiratory:   Normal respiratory effort without tachypnea/retractions. Breath sounds are clear and equal bilaterally. No wheezes/rales/rhonchi. Gastrointestinal:   Soft and nontender. Non distended. There is no CVA tenderness.  No rebound, rigidity, or guarding. Musculoskeletal:   Normal range of motion in all extremities. No joint effusions.  No lower extremity tenderness.  No edema.  Flexor and extensor function intact in the affected finger. Neurologic:   Normal speech and language.  Motor grossly intact. No acute focal neurologic deficits are appreciated.  Skin:    Skin is warm, dry with 1 cm laceration over the proximal phalanx of the right fifth finger, dorsal aspect. ____________________________________________    LABS (pertinent positives/negatives) (all labs ordered are listed, but only abnormal results are displayed) Labs Reviewed - No data to display ____________________________________________   EKG    ____________________________________________    RADIOLOGY  Dg Finger Little Right  Result Date: 03/13/2018 CLINICAL DATA:  Initial evaluation for acute laceration to right fifth digit. EXAM: RIGHT LITTLE FINGER 2+V COMPARISON:  None. FINDINGS: Soft tissue laceration at the proximal aspect of the right fifth digit. No radiopaque foreign body identified. No acute fracture or dislocation. Osseous mineralization normal. IMPRESSION: 1. Soft tissue laceration at the proximal aspect of the right fifth digit. No radiopaque foreign body. 2. No acute fracture or dislocation. Electronically Signed   By: Rise MuBenjamin  McClintock M.D.   On: 03/13/2018 03:41    ____________________________________________   PROCEDURES .Marland Kitchen.Laceration Repair Date/Time: 03/13/2018 6:07 AM Performed by:  Sharman CheekStafford, Keonta Alsip, MD Authorized by: Sharman CheekStafford, Mort Smelser, MD   Consent:    Consent obtained:  Verbal   Consent given by:  Patient   Risks discussed:  Infection, pain, retained foreign body, poor cosmetic result and poor wound healing   Alternatives discussed:  No treatment Anesthesia (see MAR for exact dosages):    Anesthesia method:  Nerve block   Block needle gauge:  25 G   Block anesthetic:  Lidocaine 1% w/o epi   Block technique:  Digital block   Block injection procedure:  Incremental injection, negative aspiration for blood, introduced needle, anatomic landmarks palpated and anatomic landmarks identified   Block outcome:  Anesthesia achieved Laceration details:    Location:  Finger   Finger location:  R small finger   Length (cm):  1 Repair type:    Repair type:  Complex Pre-procedure details:    Preparation:  Patient was prepped and draped in usual sterile fashion and imaging obtained to evaluate for foreign bodies Exploration:    Hemostasis achieved with:  Direct pressure   Wound exploration: entire depth of wound probed and visualized     Wound extent: no foreign bodies/material noted, no tendon damage noted and no vascular damage noted     Contaminated: no   Treatment:    Area cleansed with:  Saline and Betadine   Amount of cleaning:  Extensive   Irrigation solution:  Sterile saline   Irrigation method:  Pressure wash   Visualized foreign bodies/material removed: no     Debridement:  Moderate   Undermining:  Minimal   Scar revision: no   Skin repair:    Repair method:  Sutures   Suture size:  4-0   Wound skin closure material used: monocryl.   Suture technique:  Simple interrupted   Number of sutures:  3 Approximation:    Approximation:  Close Post-procedure details:    Dressing:  Sterile dressing   Patient tolerance of procedure:  Tolerated well, no immediate complications Comments:     Unable to fully approximate wound due to flap nature of laceration.  Some  of the wound periphery was not matched with dermis and will heal secondarily.    ____________________________________________    CLINICAL IMPRESSION / ASSESSMENT AND PLAN / ED COURSE  Pertinent labs & imaging results that were available during my care of the patient were reviewed by me and considered in my medical decision making (see chart for details).    Patient presents with finger laceration.  X-ray obtained to evaluate for foreign body with broken glass exposure.  None noted on x-ray.  Tetanus updated.  Finger block applied and wound repaired. Start keflex for wound prophylaxis given vulnerable anatomy.  Tendon function intact with flexors and extensors.  Outpatient follow-up with primary care.      ____________________________________________   FINAL CLINICAL IMPRESSION(S) / ED DIAGNOSES    Final diagnoses:  Laceration of right little finger without foreign body without damage to nail, initial encounter  OSA (obstructive sleep apnea)     ED Discharge Orders         Ordered    cephALEXin (KEFLEX) 500 MG capsule  3 times daily     03/13/18 0558          Portions of this note were generated with dragon dictation software. Dictation errors may occur despite best attempts at proofreading.    Sharman Cheek, MD 03/13/18 [phone]

## 2018-03-13 NOTE — ED Notes (Signed)
Continues to rest quietly with eyes closed.  No acute distress noted. 

## 2018-03-15 ENCOUNTER — Encounter: Payer: Self-pay | Admitting: Emergency Medicine

## 2018-03-15 ENCOUNTER — Emergency Department: Payer: Self-pay

## 2018-03-15 ENCOUNTER — Emergency Department
Admission: EM | Admit: 2018-03-15 | Discharge: 2018-03-15 | Disposition: A | Discharge status: Home / Self Care | Payer: Self-pay | Attending: Emergency Medicine | Admitting: Emergency Medicine

## 2018-03-15 DIAGNOSIS — F419 Anxiety disorder, unspecified: Secondary | ICD-10-CM | POA: Insufficient documentation

## 2018-03-15 DIAGNOSIS — Z79899 Other long term (current) drug therapy: Secondary | ICD-10-CM | POA: Insufficient documentation

## 2018-03-15 DIAGNOSIS — F1210 Cannabis abuse, uncomplicated: Secondary | ICD-10-CM | POA: Insufficient documentation

## 2018-03-15 DIAGNOSIS — F17200 Nicotine dependence, unspecified, uncomplicated: Secondary | ICD-10-CM | POA: Insufficient documentation

## 2018-03-15 DIAGNOSIS — F1410 Cocaine abuse, uncomplicated: Secondary | ICD-10-CM | POA: Insufficient documentation

## 2018-03-15 DIAGNOSIS — F1310 Sedative, hypnotic or anxiolytic abuse, uncomplicated: Secondary | ICD-10-CM | POA: Insufficient documentation

## 2018-03-15 LAB — CBC WITH DIFFERENTIAL/PLATELET
ABS IMMATURE GRANULOCYTES: 0.03 10*3/uL (ref 0.00–0.07)
BASOS PCT: 1 %
Basophils Absolute: 0.1 10*3/uL (ref 0.0–0.1)
Eosinophils Absolute: 0 10*3/uL (ref 0.0–0.5)
Eosinophils Relative: 0 %
HCT: 44.4 % (ref 39.0–52.0)
HEMOGLOBIN: 14.4 g/dL (ref 13.0–17.0)
Immature Granulocytes: 1 %
LYMPHS PCT: 25 %
Lymphs Abs: 1.6 10*3/uL (ref 0.7–4.0)
MCH: 29.6 pg (ref 26.0–34.0)
MCHC: 32.4 g/dL (ref 30.0–36.0)
MCV: 91.4 fL (ref 80.0–100.0)
MONO ABS: 0.6 10*3/uL (ref 0.1–1.0)
Monocytes Relative: 10 %
NRBC: 0 % (ref 0.0–0.2)
Neutro Abs: 4.1 10*3/uL (ref 1.7–7.7)
Neutrophils Relative %: 63 %
PLATELETS: 209 10*3/uL (ref 150–400)
RBC: 4.86 MIL/uL (ref 4.22–5.81)
RDW: 12.2 % (ref 11.5–15.5)
WBC: 6.4 10*3/uL (ref 4.0–10.5)

## 2018-03-15 LAB — URINALYSIS, COMPLETE (UACMP) WITH MICROSCOPIC
BILIRUBIN URINE: NEGATIVE
Bacteria, UA: NONE SEEN
Glucose, UA: >=500 mg/dL — AB
Hgb urine dipstick: NEGATIVE
Ketones, ur: NEGATIVE mg/dL
Leukocytes, UA: NEGATIVE
Nitrite: NEGATIVE
PROTEIN: 30 mg/dL — AB
Specific Gravity, Urine: 1.018 (ref 1.005–1.030)
Squamous Epithelial / LPF: NONE SEEN (ref 0–5)
pH: 5 (ref 5.0–8.0)

## 2018-03-15 LAB — COMPREHENSIVE METABOLIC PANEL
ALBUMIN: 4.9 g/dL (ref 3.5–5.0)
ALT: 16 U/L (ref 0–44)
AST: 28 U/L (ref 15–41)
Alkaline Phosphatase: 73 U/L (ref 38–126)
Anion gap: 11 (ref 5–15)
BILIRUBIN TOTAL: 1.1 mg/dL (ref 0.3–1.2)
BUN: 16 mg/dL (ref 6–20)
CO2: 26 mmol/L (ref 22–32)
Calcium: 9.1 mg/dL (ref 8.9–10.3)
Chloride: 99 mmol/L (ref 98–111)
Creatinine, Ser: 1.29 mg/dL — ABNORMAL HIGH (ref 0.61–1.24)
GFR calc Af Amer: >60 mL/min (ref >60)
GFR calc non Af Amer: >60 mL/min (ref >60)
GLUCOSE: 292 mg/dL — AB (ref 70–99)
POTASSIUM: 3.6 mmol/L (ref 3.5–5.1)
SODIUM: 136 mmol/L (ref 135–145)
TOTAL PROTEIN: 7.6 g/dL (ref 6.5–8.1)

## 2018-03-15 LAB — URINE DRUG SCREEN, QUALITATIVE (ARMC ONLY)
Amphetamines, Ur Screen: NOT DETECTED
BARBITURATES, UR SCREEN: NOT DETECTED
Benzodiazepine, Ur Scrn: POSITIVE — AB
COCAINE METABOLITE, UR ~~LOC~~: POSITIVE — AB
Cannabinoid 50 Ng, Ur ~~LOC~~: POSITIVE — AB
MDMA (Ecstasy)Ur Screen: NOT DETECTED
METHADONE SCREEN, URINE: NOT DETECTED
Opiate, Ur Screen: NOT DETECTED
Phencyclidine (PCP) Ur S: NOT DETECTED
TRICYCLIC, UR SCREEN: NOT DETECTED

## 2018-03-15 LAB — GLUCOSE, CAPILLARY: GLUCOSE-CAPILLARY: 91 mg/dL (ref 70–99)

## 2018-03-15 LAB — SALICYLATE LEVEL: Salicylate Lvl: <7 mg/dL (ref 2.8–30.0)

## 2018-03-15 LAB — ETHANOL: Alcohol, Ethyl (B): <10 mg/dL (ref <10)

## 2018-03-15 LAB — ACETAMINOPHEN LEVEL

## 2018-03-15 MED ORDER — ONDANSETRON HCL 4 MG/2ML IJ SOLN
4.0000 mg | Freq: Once | INTRAMUSCULAR | Status: AC
  Administered 2018-03-15: 4 mg via INTRAVENOUS
  Filled 2018-03-15: qty 2

## 2018-03-15 NOTE — ED Notes (Signed)
Mother remains at bedside side rails  elevated

## 2018-03-15 NOTE — ED Notes (Signed)
Dr  Patria Maneverenase  Notified   Of patient c/o blurred  Vision that started  approx  About 45  mins   Pt is able to see  Finger count at 10 feet   Able to ambulate  To bathroom with a steady gait    Dr Ferrel Loganverenace  Spoke with patient

## 2018-03-15 NOTE — ED Provider Notes (Addendum)
[facility] Emergency Department Provider Note  ____________________________________________   I have reviewed the triage vital signs and the nursing notes. Where available I have reviewed prior notes and, if possible and indicated, outside hospital notes.    HISTORY  Chief Complaint No chief complaint on file.    HPI Philip Owen is a 42 y.o. male  Since he does have a history of anxiety takes Xanax, also states that he used to use heroin, but only snorting it, patient states he has not used heroin recently, he did use Xanax today.  He states he remembers waking up is not sure what happened after that.  Apparently, according to witnesses, he walked out of the room and fell into a bush.  There is no significant injury noted but they did give him Narcan which caused him to wake up.  Patient states he does not remember any event he is confused about why he is here.  He has no pain at this time.  He denies using any recent Xanax or Percocet or any other opiate.  EMS does report a very good response however to Narcan.     Past Medical History:  Diagnosis Date  . Acid reflux   . Anxiety   . GERD (gastroesophageal reflux disease)     Patient Active Problem List   Diagnosis Date Noted  . Pneumonia 05/22/2017    Past Surgical History:  Procedure Laterality Date  . none      Prior to Admission medications   Medication Sig Start Date End Date Taking? Authorizing Provider  brompheniramine-pseudoephedrine-DM 30-2-10 MG/5ML syrup Take 5 mLs by mouth 4 (four) times daily as needed. 11/21/17   Joni ReiningSmith, Ronald K, PA-C  cephALEXin (KEFLEX) 500 MG capsule Take 1 capsule (500 mg total) by mouth 3 (three) times daily. 03/13/18   Sharman CheekStafford, Phillip, MD  esomeprazole (NEXIUM) 20 MG capsule Take 20 mg by mouth daily at 12 noon.    [provider]  loratadine-pseudoephedrine (CLARITIN-D 12 HOUR) 5-120 MG tablet Take 1 tablet by mouth 2 (two) times daily. 02/08/18  02/08/19  Evon SlackGaines, Thomas C, PA-C  naloxone [facility](NARCAN) nasal spray 4 mg/0.1 mL Spray the contents of 1 nasal spray as a single dose in one nostril; may repeat every 2 to 3 minutes in alternating nostrils until medical assistance becomes available 05/23/17   Katha HammingKonidena, Snehalatha, MD  ondansetron (ZOFRAN) 8 MG tablet Take 1 tablet by mouth every 8 (eight) hours as needed. 05/03/17   [provider]  pantoprazole (PROTONIX) 40 MG tablet Take 1 tablet (40 mg total) by mouth daily. 05/24/17   Katha HammingKonidena, Snehalatha, MD  predniSONE (DELTASONE) 20 MG tablet Take 2 tablets (40 mg total) by mouth daily. 02/08/18   Evon SlackGaines, Thomas C, PA-C    Allergies Tramadol and Naproxen  Family History  Problem Relation Age of Onset  . Brain cancer Father     Social History Social History   Tobacco Use  . Smoking status: Current Every Day Smoker    Packs/day: 0.50  . Smokeless tobacco: Never Used  Substance Use Topics  . Alcohol use: No  . Drug use: Yes    Types: Cocaine    Review of Systems Constitutional: No fever/chills Eyes: No visual changes. ENT: No sore throat. No stiff neck no neck pain Cardiovascular: Denies chest pain. Respiratory: Denies shortness of breath. Gastrointestinal:   no vomiting.  No diarrhea.  No constipation. Genitourinary: Negative for dysuria. Musculoskeletal: Negative lower extremity swelling Skin: Negative for rash.  Neurological: Negative for severe headaches, focal weakness or numbness.   ____________________________________________   PHYSICAL EXAM:  VITAL SIGNS: ED Triage Vitals  Enc Vitals Group     BP 03/15/18 1002 (!) 141/97     Pulse Rate 03/15/18 1002 94     Resp 03/15/18 1002 16     Temp 03/15/18 1002 97.9 F (36.6 C)     Temp Source 03/15/18 1002 Oral     SpO2 03/15/18 1002 96 %     Weight --      Height --      Head Circumference --      Peak Flow --      Pain Score 03/15/18 1003 0     Pain Loc --      Pain Edu? --      Excl. in GC? --      Constitutional: Alert and oriented.  Is very anxious but no acute medical distress, does seem to repeat himself a little bit seems mildly confused Eyes: Conjunctivae are normal Head: Atraumatic HEENT: No congestion/rhinnorhea. Mucous membranes are moist.  Oropharynx non-erythematous Neck:   Nontender with no meningismus, no masses, no stridor Cardiovascular: Normal rate, regular rhythm. Grossly normal heart sounds.  Good peripheral circulation. Respiratory: Normal respiratory effort.  No retractions. Lungs CTAB. Abdominal: Soft and nontender. No distention. No guarding no rebound Back:  There is no focal tenderness or step off.  there is no midline tenderness there are no lesions noted. there is no CVA tenderness Musculoskeletal: No lower extremity tenderness, no upper extremity tenderness. No joint effusions, no DVT signs strong distal pulses no edema Neurologic:  Normal speech and language. No gross focal neurologic deficits are appreciated.  Skin:  Skin is warm, dry and intact. No rash noted. Psychiatric: Mood and affect are normal. Speech and behavior are normal.  ____________________________________________   LABS (all labs ordered are listed, but only abnormal results are displayed)  Labs Reviewed  URINALYSIS, COMPLETE (UACMP) WITH MICROSCOPIC  URINE DRUG SCREEN, QUALITATIVE ([facility] ONLY)  COMPREHENSIVE METABOLIC PANEL  ETHANOL  SALICYLATE LEVEL  ACETAMINOPHEN LEVEL  CBC WITH DIFFERENTIAL/PLATELET    Pertinent labs  results that were available during my care of the patient were reviewed by me and considered in my medical decision making (see chart for details). ____________________________________________  EKG  I personally interpreted any EKGs ordered by me or triage Normal sinus rhythm, mild tachycardia noted rate 102, no acute ST elevation or depression repolarization of normality noted.  Normal  axis. ____________________________________________  RADIOLOGY  Pertinent labs & imaging results that were available during my care of the patient were reviewed by me and considered in my medical decision making (see chart for details). If possible, patient and/or family made aware of any abnormal findings.  No results found. ____________________________________________    PROCEDURES  Procedure(s) performed: None  Procedures  Critical Care performed: None  ____________________________________________   INITIAL IMPRESSION / ASSESSMENT AND PLAN / ED COURSE  Pertinent labs & imaging results that were available during my care of the patient were reviewed by me and considered in my medical decision making (see chart for details).  Patient here after passing out and receiving Narcan and waking up.  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  Denies recent heroin or fentanyl ingestion.  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  He states he was up all night.  States that he was on a "bender".  He has no SI no HI would prefer not comfortable counselor.  Patient at this time seems to be just sleepy from the access is the night before the work-up was otherwise unremarkable, will continue to observe and if he gets to the point where he is reliably awake and walking around okay we will get him home.  He is with his mother.  He has had no narcotics in his drug screen which is not 100%  sensitive.  ----------------------------------------- 3:48 PM on 03/15/2018 -----------------------------------------  Patient wide-awake at this time, he is requesting discharge, he has no SI no HI, no evidence of significant trauma on tertiary exam, family are with him and will stay with him, he does not want to stay any longer.  Patient states that he does not want any acute counseling about his substance abuse or rehab.  Given that he contracts for safety is wide-awake we will discharge him home with his mother.  Return precautions follow-up given and understood.  She is initial sugar was elevated however on recheck it is 91.  It is my suspicion he received dextrose in the field although I cannot verify this as it was not initially documented in those notes are not available to me at this time.  In any event there is no history of diabetes and his sugar is normal at this time we will have him follow-up and I did make him aware of these findings and the need to follow-up on them.  He is tolerating p.o. with no difficulty here and wide-awake    ____________________________________________   FINAL CLINICAL IMPRESSION(S) / ED DIAGNOSES  Final diagnoses:  None      This chart was dictated using voice recognition software.  Despite best efforts to proofread,  errors can occur which can change meaning.      Jeanmarie Plant, MD 03/15/18 1013    Jeanmarie Plant, MD 03/15/18 1341    Jeanmarie Plant, MD 03/15/18 1550    Jeanmarie Plant, MD 03/15/18 [phone]

## 2018-03-15 NOTE — ED Notes (Signed)
Pt has  Periods  Of lethargy with some snoring   At time responds  To stimuli

## 2018-03-15 NOTE — ED Notes (Signed)
Patients  Sister  With patient he is  Awake  And  Alert

## 2018-03-15 NOTE — Discharge Instructions (Signed)
Stay with family today, return to the emergency room for any new or worrisome symptoms, do not abuse narcotics, cocaine, marijuana or other medications.  Stay with family until tomorrow but do not drive today.  Never drive while under the influence of mind altering drugs.

## 2018-03-15 NOTE — ED Notes (Signed)
Mother at  bedside - side rails elevated

## 2018-03-15 NOTE — ED Notes (Signed)
Pt  Lethargic at time  Yet is  Verbally  Responsive  Reports  Feeling tired  And  And  Nauseated

## 2018-03-15 NOTE — ED Triage Notes (Signed)
[facility] Pt was witnessed exiting motel and falling into shrubs. Upon FD arrival pt was unresponsive. EMS arrived and administered 2 doses of Narcan. Pt currently A&O x4.

## 2018-03-17 ENCOUNTER — Other Ambulatory Visit: Payer: Self-pay

## 2018-03-17 ENCOUNTER — Emergency Department
Admission: EM | Admit: 2018-03-17 | Discharge: 2018-03-17 | Disposition: A | Discharge status: Home / Self Care | Payer: Medicaid Other | Attending: Emergency Medicine | Admitting: Emergency Medicine

## 2018-03-17 ENCOUNTER — Encounter: Payer: Self-pay | Admitting: Emergency Medicine

## 2018-03-17 DIAGNOSIS — S61216D Laceration without foreign body of right little finger without damage to nail, subsequent encounter: Secondary | ICD-10-CM | POA: Insufficient documentation

## 2018-03-17 DIAGNOSIS — Z5189 Encounter for other specified aftercare: Secondary | ICD-10-CM

## 2018-03-17 DIAGNOSIS — Z79899 Other long term (current) drug therapy: Secondary | ICD-10-CM | POA: Insufficient documentation

## 2018-03-17 DIAGNOSIS — X58XXXD Exposure to other specified factors, subsequent encounter: Secondary | ICD-10-CM | POA: Insufficient documentation

## 2018-03-17 DIAGNOSIS — F17200 Nicotine dependence, unspecified, uncomplicated: Secondary | ICD-10-CM | POA: Insufficient documentation

## 2018-03-17 NOTE — ED Provider Notes (Signed)
[facility] Emergency Department Provider Note   ____________________________________________   First MD Initiated Contact with Patient 03/17/18 1214     (approximate)  I have reviewed the triage vital signs and the nursing notes.   HISTORY  Chief Complaint Suture / Staple Removal    HPI Philip Owen is a 42 y.o. male patient reports for suture removal secondary to a finger laceration seen in this facility 4 days ago.  Patient is under the impression  need sutures removed today.  Patient did not have previous discharge information from.  Patient denies pain or loss of sensation to the fifth digit right hand.  Past Medical History:  Diagnosis Date  . Acid reflux   . Anxiety   . GERD (gastroesophageal reflux disease)     Patient Active Problem List   Diagnosis Date Noted  . Pneumonia 05/22/2017    Past Surgical History:  Procedure Laterality Date  . none      Prior to Admission medications   Medication Sig Start Date End Date Taking? Authorizing Provider  brompheniramine-pseudoephedrine-DM 30-2-10 MG/5ML syrup Take 5 mLs by mouth 4 (four) times daily as needed. 11/21/17   Joni ReiningSmith, Ronald K, PA-C  cephALEXin (KEFLEX) 500 MG capsule Take 1 capsule (500 mg total) by mouth 3 (three) times daily. 03/13/18   Sharman CheekStafford, Phillip, MD  esomeprazole (NEXIUM) 20 MG capsule Take 20 mg by mouth daily at 12 noon.    [provider]  loratadine-pseudoephedrine (CLARITIN-D 12 HOUR) 5-120 MG tablet Take 1 tablet by mouth 2 (two) times daily. 02/08/18 02/08/19  Evon SlackGaines, Thomas C, PA-C  naloxone [facility](NARCAN) nasal spray 4 mg/0.1 mL Spray the contents of 1 nasal spray as a single dose in one nostril; may repeat every 2 to 3 minutes in alternating nostrils until medical assistance becomes available 05/23/17   Katha HammingKonidena, Snehalatha, MD  ondansetron (ZOFRAN) 8 MG tablet Take 1 tablet by mouth every 8 (eight) hours as needed. 05/03/17   [provider]    pantoprazole (PROTONIX) 40 MG tablet Take 1 tablet (40 mg total) by mouth daily. 05/24/17   Katha HammingKonidena, Snehalatha, MD  predniSONE (DELTASONE) 20 MG tablet Take 2 tablets (40 mg total) by mouth daily. 02/08/18   Evon SlackGaines, Thomas C, PA-C    Allergies Tramadol and Naproxen  Family History  Problem Relation Age of Onset  . Brain cancer Father     Social History Social History   Tobacco Use  . Smoking status: Current Every Day Smoker    Packs/day: 0.50  . Smokeless tobacco: Never Used  Substance Use Topics  . Alcohol use: No  . Drug use: Yes    Types: Cocaine    Review of Systems Constitutional: No fever/chills Eyes: No visual changes. ENT: No sore throat. Cardiovascular: Denies chest pain. Respiratory: Denies shortness of breath. Gastrointestinal: No abdominal pain.  No nausea, no vomiting.  No diarrhea.  No constipation. Genitourinary: Negative for dysuria. Musculoskeletal: Negative for back pain. Skin: Negative for rash. Neurological: Negative for headaches, focal weakness or numbness. Psychiatric: Anxiety narcotic abuse.  Allergic/Immunilogical: Tramadol and naproxen.  ____________________________________________   PHYSICAL EXAM:  VITAL SIGNS: ED Triage Vitals  Enc Vitals Group     BP 03/17/18 1208 (!) 144/82     Pulse Rate 03/17/18 1208 69     Resp 03/17/18 1208 16     Temp 03/17/18 1208 98.9 F (37.2 C)     Temp Source 03/17/18 1208 Oral     SpO2 03/17/18 1208 97 %  Weight 03/17/18 1209 155 lb (70.3 kg)     Height 03/17/18 1209 5\' 8"  (1.727 m)     Head Circumference --      Peak Flow --      Pain Score 03/17/18 1209 0     Pain Loc --      Pain Edu? --      Excl. in GC? --     Constitutional: Alert and oriented. Well appearing and in no acute distress. Cardiovascular: Normal rate, regular rhythm. Grossly normal heart sounds.  Good peripheral circulation. Respiratory: Normal respiratory effort.  No retractions. Lungs CTAB. Skin: Healing laceration to  the dorsal aspect of the fifth digit right hand.  Sutures are intact.. No rash noted. Psychiatric: Mood and affect are normal. Speech and behavior are normal.  ____________________________________________   LABS (all labs ordered are listed, but only abnormal results are displayed)  Labs Reviewed - No data to display ____________________________________________  EKG   ____________________________________________  RADIOLOGY  ED MD interpretation:    Official radiology report(s): No results found.  ____________________________________________   PROCEDURES  Procedure(s) performed: None  Procedures  Critical Care performed: No  ____________________________________________   INITIAL IMPRESSION / ASSESSMENT AND PLAN / ED COURSE  As part of my medical decision making, I reviewed the following data within the electronic MEDICAL RECORD NUMBER    Wound check secondary to laceration which occurred 4 days ago.  Patient given discharge care instructions and advised to return back in 6 days for suture removal.      ____________________________________________   FINAL CLINICAL IMPRESSION(S) / ED DIAGNOSES  Final diagnoses:  Visit for wound check     ED Discharge Orders    None       Note:  This document was prepared using Dragon voice recognition software and may include unintentional dictation errors.    Joni Reining, PA-C 03/17/18 1225    Dionne Bucy, MD 03/17/18 1302

## 2018-03-17 NOTE — Discharge Instructions (Addendum)
Continue previous medication have sutures removed in 6 days.

## 2018-03-17 NOTE — ED Notes (Signed)
See triage note  Presents with laceration to right 5 th finger    States it was repaired 4 days ago and states he thinks his sutures are ready to be removed

## 2018-03-17 NOTE — ED Triage Notes (Signed)
PT states he is here to have stitches removed from right finger. Pt has stiches placed Tuesday.

## 2018-04-12 ENCOUNTER — Emergency Department
Admission: EM | Admit: 2018-04-12 | Discharge: 2018-04-12 | Disposition: A | Discharge status: Home / Self Care | Payer: Medicaid Other | Attending: Emergency Medicine | Admitting: Emergency Medicine

## 2018-04-12 ENCOUNTER — Other Ambulatory Visit: Payer: Self-pay

## 2018-04-12 ENCOUNTER — Emergency Department: Payer: Medicaid Other

## 2018-04-12 DIAGNOSIS — J449 Chronic obstructive pulmonary disease, unspecified: Secondary | ICD-10-CM | POA: Insufficient documentation

## 2018-04-12 DIAGNOSIS — F17210 Nicotine dependence, cigarettes, uncomplicated: Secondary | ICD-10-CM | POA: Insufficient documentation

## 2018-04-12 DIAGNOSIS — Z79899 Other long term (current) drug therapy: Secondary | ICD-10-CM | POA: Insufficient documentation

## 2018-04-12 DIAGNOSIS — J069 Acute upper respiratory infection, unspecified: Secondary | ICD-10-CM | POA: Insufficient documentation

## 2018-04-12 MED ORDER — ALBUTEROL SULFATE HFA 108 (90 BASE) MCG/ACT IN AERS: 2.0000 | INHALATION_SPRAY | Freq: Four times a day (QID) | RESPIRATORY_TRACT | 2 refills | Status: DC | PRN

## 2018-04-12 MED ORDER — IPRATROPIUM-ALBUTEROL 0.5-2.5 (3) MG/3ML IN SOLN
3.0000 mL | Freq: Once | RESPIRATORY_TRACT | Status: AC
  Administered 2018-04-12: 3 mL via RESPIRATORY_TRACT
  Filled 2018-04-12: qty 3

## 2018-04-12 MED ORDER — PREDNISONE 20 MG PO TABS: 40.0000 mg | ORAL_TABLET | Freq: Every day | ORAL | 0 refills | Status: DC

## 2018-04-12 MED ORDER — PSEUDOEPH-BROMPHEN-DM 30-2-10 MG/5ML PO SYRP: 5.0000 mL | ORAL_SOLUTION | Freq: Four times a day (QID) | ORAL | 0 refills | Status: DC | PRN

## 2018-04-12 MED ORDER — ALBUTEROL SULFATE (2.5 MG/3ML) 0.083% IN NEBU: 2.5000 mg | INHALATION_SOLUTION | Freq: Four times a day (QID) | RESPIRATORY_TRACT | 12 refills | Status: DC | PRN

## 2018-04-12 NOTE — ED Notes (Signed)
FIRST NURSE NOTE:  Pt here with cold sxs, has significant other also being seen for cold sxs.

## 2018-04-12 NOTE — ED Triage Notes (Signed)
Cough, congestion x 3 days. A&O, ambulatory. No distress noted.

## 2018-04-12 NOTE — Discharge Instructions (Signed)
Follow-up with your primary care provider if any continued problems.  Discontinue or decrease smoking if possible which will help with your upper respiratory infection.  A prescription for Bromfed-DM, prednisone, and albuterol inhaler was sent to your pharmacy.  Increase fluids.  Tylenol if needed for body aches or headache.

## 2018-04-12 NOTE — ED Provider Notes (Signed)
[facility] Emergency Department Provider Note  ____________________________________________   None    (approximate)  I have reviewed the triage vital signs and the nursing notes.   HISTORY  Chief Complaint Cough   HPI Philip Owen is a 42 y.o. male presents to the ED with complaint of cough and congestion for the last 3 days.  Patient is unaware of any fever and denies chills.  He is concerned because he had pneumonia the first of this year.  He continues to smoke daily.  He rates his pain as 5/10.   Past Medical History:  Diagnosis Date  . Acid reflux   . Anxiety   . GERD (gastroesophageal reflux disease)     Patient Active Problem List   Diagnosis Date Noted  . Pneumonia 05/22/2017    Past Surgical History:  Procedure Laterality Date  . none      Prior to Admission medications   Medication Sig Start Date End Date Taking? Authorizing Provider  albuterol (PROVENTIL HFA;VENTOLIN HFA) 108 (90 Base) MCG/ACT inhaler Inhale 2 puffs into the lungs every 6 (six) hours as needed for wheezing or shortness of breath. 04/12/18   Tommi RumpsSummers, Rhonda L, PA-C  albuterol (PROVENTIL) (2.5 MG/3ML) 0.083% nebulizer solution Take 3 mLs (2.5 mg total) by nebulization every 6 (six) hours as needed for wheezing or shortness of breath. 04/12/18   Tommi RumpsSummers, Rhonda L, PA-C  brompheniramine-pseudoephedrine-DM 30-2-10 MG/5ML syrup Take 5 mLs by mouth 4 (four) times daily as needed. 04/12/18   Tommi RumpsSummers, Rhonda L, PA-C  esomeprazole (NEXIUM) 20 MG capsule Take 20 mg by mouth daily at 12 noon.    [provider]  loratadine-pseudoephedrine (CLARITIN-D 12 HOUR) 5-120 MG tablet Take 1 tablet by mouth 2 (two) times daily. 02/08/18 02/08/19  Evon SlackGaines, Thomas C, PA-C  naloxone [facility](NARCAN) nasal spray 4 mg/0.1 mL Spray the contents of 1 nasal spray as a single dose in one nostril; may repeat every 2 to 3 minutes in alternating nostrils until medical assistance becomes available  05/23/17   Katha HammingKonidena, Snehalatha, MD  ondansetron (ZOFRAN) 8 MG tablet Take 1 tablet by mouth every 8 (eight) hours as needed. 05/03/17   [provider]  pantoprazole (PROTONIX) 40 MG tablet Take 1 tablet (40 mg total) by mouth daily. 05/24/17   Katha HammingKonidena, Snehalatha, MD  predniSONE (DELTASONE) 20 MG tablet Take 2 tablets (40 mg total) by mouth daily. 04/12/18   Tommi RumpsSummers, Rhonda L, PA-C    Allergies Tramadol and Naproxen  Family History  Problem Relation Age of Onset  . Brain cancer Father     Social History Social History   Tobacco Use  . Smoking status: Current Every Day Smoker    Packs/day: 0.50  . Smokeless tobacco: Never Used  Substance Use Topics  . Alcohol use: No  . Drug use: Yes    Types: Cocaine    Review of Systems Constitutional: No fever/chills Eyes: No visual changes. ENT: Positive nasal congestion, negative sore throat. Cardiovascular: Denies chest pain. Respiratory: Denies shortness of breath.  Positive congested cough. Gastrointestinal: No abdominal pain.  No nausea, no vomiting.  Musculoskeletal: Negative for muscle aches. Skin: Negative for rash. Neurological: Negative for headaches, focal weakness or numbness. ____________________________________________   PHYSICAL EXAM:  VITAL SIGNS: ED Triage Vitals  Enc Vitals Group     BP 04/12/18 1259 (!) 153/99     Pulse Rate 04/12/18 1259 73     Resp 04/12/18 1259 16     Temp 04/12/18 1259 98.2  F (36.8 C)     Temp Source 04/12/18 1259 Oral     SpO2 04/12/18 1259 94 %     Weight --      Height --      Head Circumference --      Peak Flow --      Pain Score 04/12/18 1301 5     Pain Loc --      Pain Edu? --      Excl. in GC? --    Constitutional: Alert and oriented. Well appearing and in no acute distress. Eyes: Conjunctivae are normal. PERRL. EOMI. Head: Atraumatic. Nose: Mild congestion/rhinnorhea. Mouth/Throat: Mucous membranes are moist.  Oropharynx non-erythematous. Neck: No stridor.     Cardiovascular: Normal rate, regular rhythm. Grossly normal heart sounds.  Good peripheral circulation. Respiratory: Normal respiratory effort.  No retractions. Lungs decreased breath sounds with coarse congested cough. Musculoskeletal: Moves upper and lower extremities without any difficulty.  Normal gait was noted. Neurologic:  Normal speech and language. No gross focal neurologic deficits are appreciated. No gait instability. Skin:  Skin is warm, dry and intact. No rash noted. Psychiatric: Mood and affect are normal. Speech and behavior are normal.  ____________________________________________   LABS (all labs ordered are listed, but only abnormal results are displayed)  Labs Reviewed - No data to display RADIOLOGY  Official radiology report(s): Dg Chest 2 View  Result Date: 04/12/2018 CLINICAL DATA:  Cough 1 week EXAM: CHEST - 2 VIEW COMPARISON:  03/15/2018 FINDINGS: Pulmonary hyperinflation with changes of COPD. Negative for pneumonia. Heart size and vascularity normal. Negative for infiltrate effusion or mass. IMPRESSION: COPD without acute radiographic abnormality. Electronically Signed   By: Marlan Palau M.D.   On: 04/12/2018 15:46    ____________________________________________   PROCEDURES  Procedure(s) performed: None  Procedures  Critical Care performed: No  ____________________________________________   INITIAL IMPRESSION / ASSESSMENT AND PLAN / ED COURSE  As part of my medical decision making, I reviewed the following data within the electronic MEDICAL RECORD NUMBER Notes from prior ED visits and [place] Controlled Substance Database  To the ED with complaint of upper respiratory symptoms and shortness of breath.  Patient states he is concerned because he has had pneumonia the first of this year.  Patient continues to smoke cigarettes daily.  He is unaware of any fever and denies chills.  Physical exam is unremarkable.  Chest x-ray was negative for pneumonia but did  show COPD and patient was made aware.  We discussed discontinuing smoking.  Patient was discharged in stable condition with Bromfed-DM, albuterol inhaler and prednisone.  He was also given a prescription for albuterol nebulizer solution as his girlfriend has a nebulizer machine.  He is encouraged to follow-up with his PCP if any continued problems.  ____________________________________________   FINAL CLINICAL IMPRESSION(S) / ED DIAGNOSES  Final diagnoses:  Acute upper respiratory infection  Chronic obstructive pulmonary disease, unspecified COPD type (HCC)  Cigarette smoker     ED Discharge Orders         Ordered    brompheniramine-pseudoephedrine-DM 30-2-10 MG/5ML syrup  4 times daily PRN     04/12/18 1552    albuterol (PROVENTIL HFA;VENTOLIN HFA) 108 (90 Base) MCG/ACT inhaler  Every 6 hours PRN     04/12/18 1552    predniSONE (DELTASONE) 20 MG tablet  Daily     04/12/18 1553    albuterol (PROVENTIL) (2.5 MG/3ML) 0.083% nebulizer solution  Every 6 hours PRN     04/12/18 1621  Note:  This document was prepared using Dragon voice recognition software and may include unintentional dictation errors.    Tommi Rumps, PA-C 04/12/18 1621    Schaevitz, Myra Rude, MD 04/13/18 [phone]

## 2018-07-05 IMAGING — CR DG CHEST 2V
1 series · 2 of 2 positions shown · non-contrast
Comparison: 06/01/2015

CLINICAL DATA: Cough, shortness of breath. Concern for pneumonia.
Respiratory distress this evening.

EXAM:
CHEST  2 VIEW

[Series 1: dg chest 2 view · 0.14mm/px · 2 of 2 slices shown]
[im 1/2]
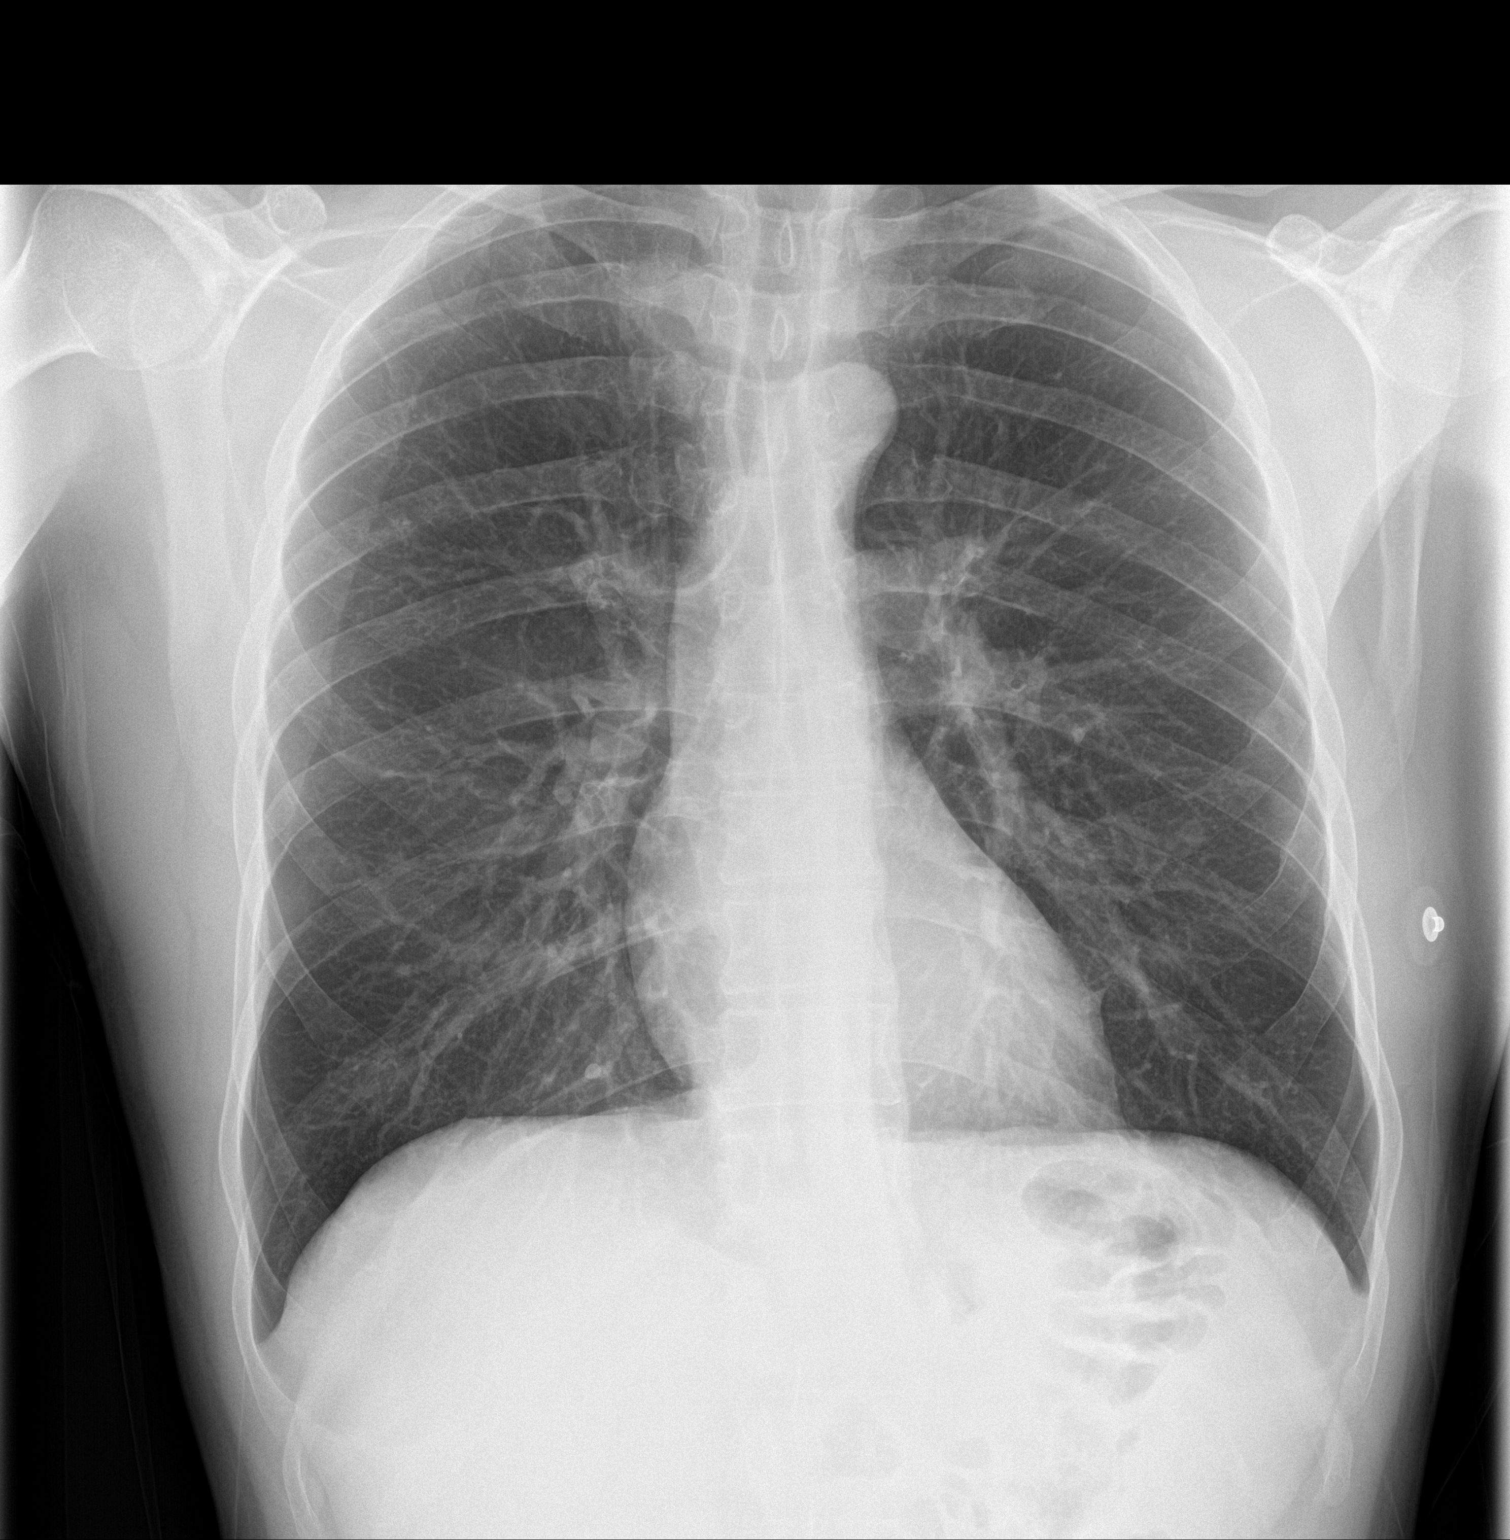
[im 2/2]
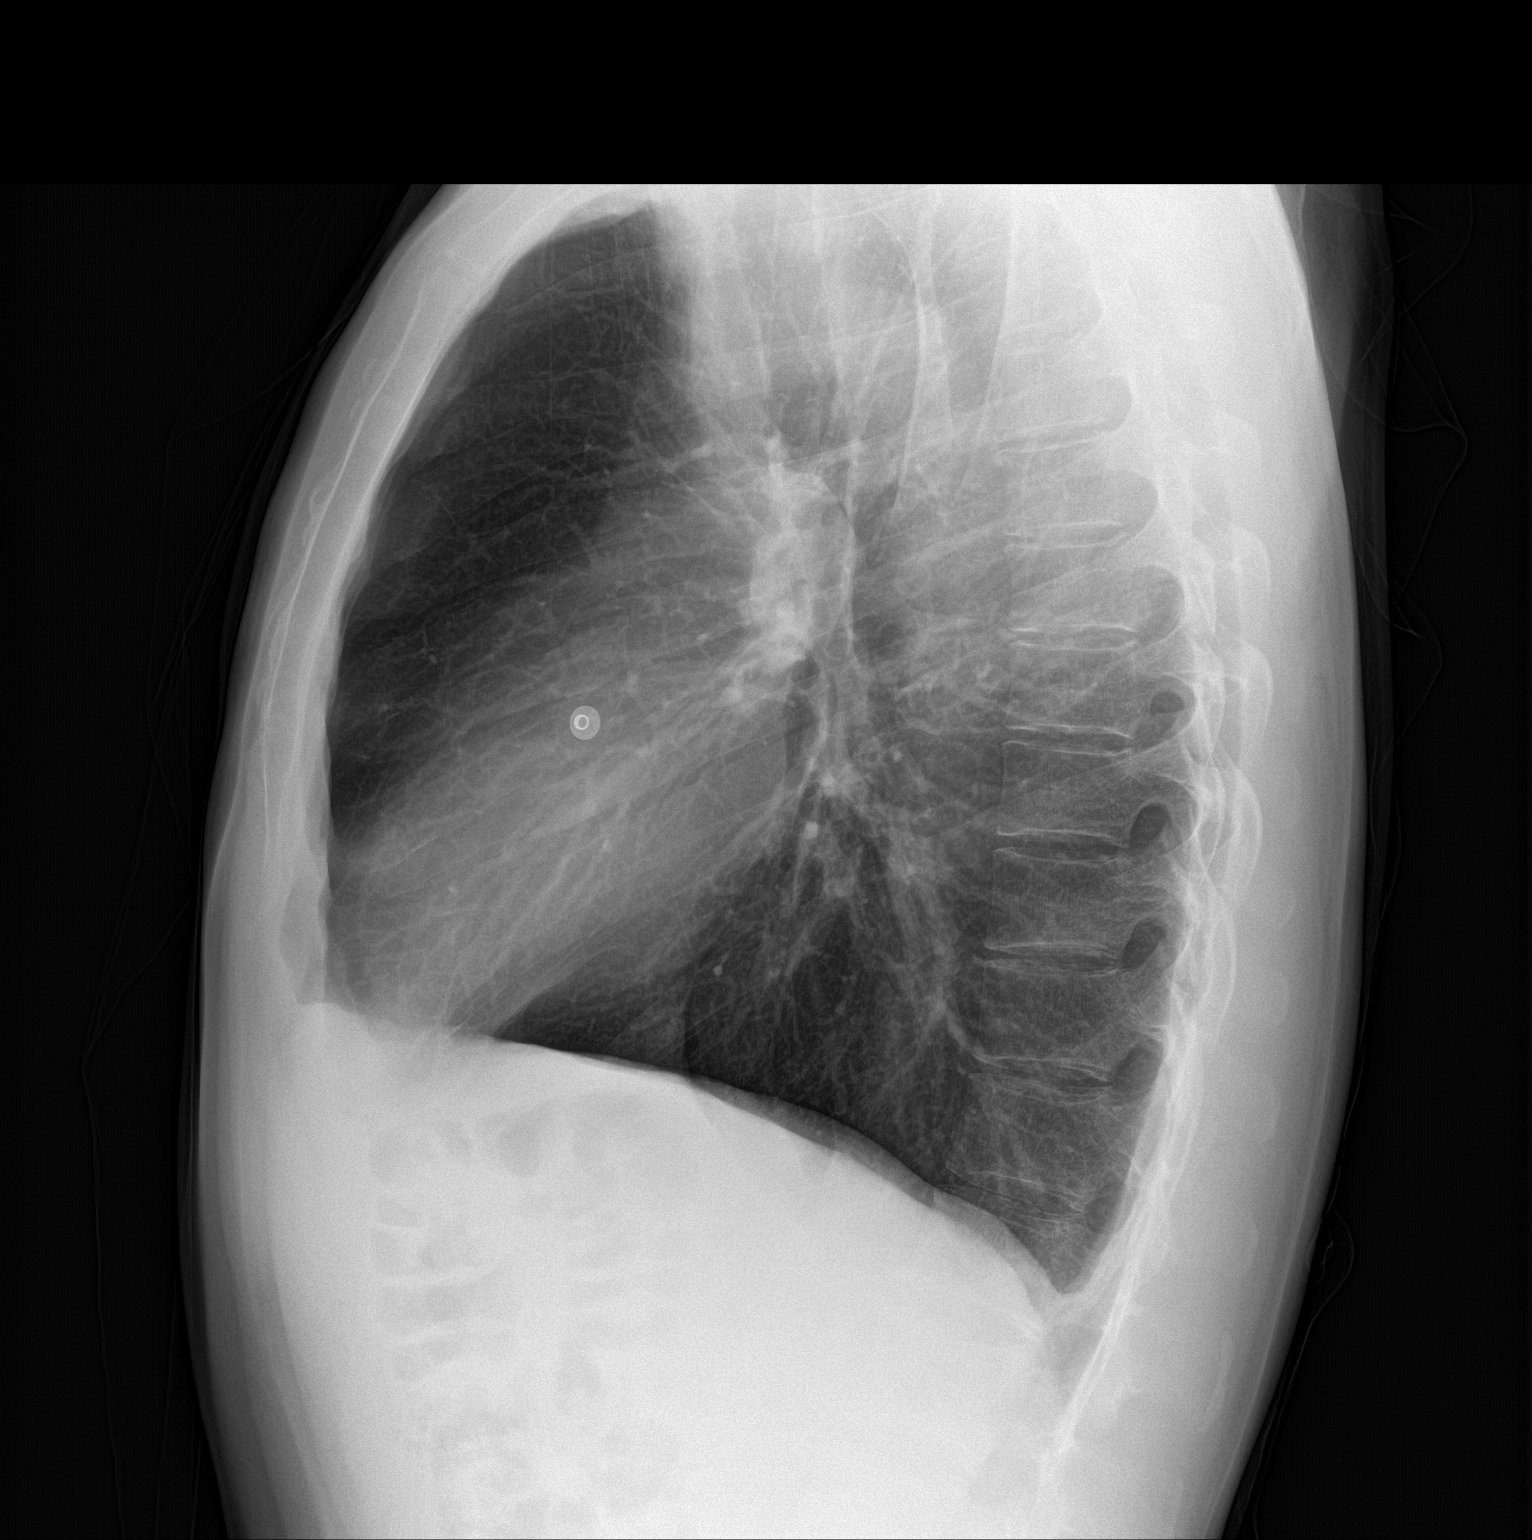

[2 of 2 positions shown; findings below may reference images not displayed]

FINDINGS: The cardiomediastinal contours are normal. Mild hyperinflation.
Pulmonary vasculature is normal. No consolidation, pleural effusion,
or pneumothorax. No acute osseous abnormalities are seen. Remote
left rib fractures.
IMPRESSION: Hyperinflation, can be seen with bronchitis or asthma. No pneumonia.

## 2019-01-15 ENCOUNTER — Ambulatory Visit: Payer: Self-pay | Admitting: Family Medicine

## 2019-01-15 ENCOUNTER — Encounter: Payer: Self-pay | Admitting: Family Medicine

## 2019-01-15 ENCOUNTER — Other Ambulatory Visit: Payer: Self-pay

## 2019-01-15 DIAGNOSIS — N341 Nonspecific urethritis: Secondary | ICD-10-CM

## 2019-01-15 DIAGNOSIS — Z113 Encounter for screening for infections with a predominantly sexual mode of transmission: Secondary | ICD-10-CM

## 2019-01-15 MED ORDER — CEFTRIAXONE SODIUM 250 MG IJ SOLR
250.0000 mg | Freq: Once | INTRAMUSCULAR | Status: AC
  Administered 2019-01-15: 16:00:00 250 mg via INTRAMUSCULAR

## 2019-01-15 MED ORDER — AZITHROMYCIN 500 MG PO TABS
1000.0000 mg | ORAL_TABLET | Freq: Once | ORAL | Status: AC
  Administered 2019-01-15: 1000 mg via ORAL

## 2019-01-15 NOTE — Progress Notes (Signed)
Patient treated for gonorrhea per provider verbal orders.Jenetta Downer, RN

## 2019-01-15 NOTE — Progress Notes (Signed)
    STI clinic/screening visit  Subjective:  JHALIL SILVERA is a 43 y.o. male being seen today for an STI screening visit. The patient reports they do have symptoms.  Patient has the following medical conditions:   Patient Active Problem List   Diagnosis Date Noted  . Pneumonia 05/22/2017     Chief Complaint  Patient presents with  . Exposure to STD    HPI  Patient reports that he has a lot of green discharge and severe burning with urination for a couple days.  States he has a new partner x 4 mos.  States that she ha no sympts.  See flowsheet for further details and programmatic requirements.    The following portions of the patient's history were reviewed and updated as appropriate: allergies, current medications, past medical history, past social history, past surgical history and problem list.  Objective:  There were no vitals filed for this visit.  Physical Exam HENT:     Mouth/Throat:     Mouth: Mucous membranes are moist.     Pharynx: No oropharyngeal exudate or posterior oropharyngeal erythema.  Neck:     Musculoskeletal: Neck supple.     Vascular: No carotid bruit.  Abdominal:     Palpations: Abdomen is soft.     Tenderness: There is no abdominal tenderness.  Genitourinary:    Scrotum/Testes: Normal.     Comments: Thick yellow disch from penis. No lesions noted. No inguinal adenopathy Lymphadenopathy:     Cervical: No cervical adenopathy.  Skin:    General: Skin is warm and dry.     Findings: No lesion or rash.  Neurological:     Mental Status: He is alert.    Assessment and Plan:  EARNESTINE TUOHEY is a 43 y.o. male presenting to the [place] for STI screening  1. Screening examination for venereal disease - Ct, Ng, Mycoplasmas NAA, Urine - HIV [facility] - Syphilis Serology, [facility]  2. Nongonococcal urethritis - azithromycin (ZITHROMAX) tablet 1,000 mg - cefTRIAXone (ROCEPHIN) injection 250 mg Co no sexual  activity until both he and partner are treated x 1 wk.  He will notify partner of her need for treatment. Return to clinic if sympts not resolved in 2 wks.   No follow-ups on file.  No future appointments.  Hassell Done, FNP

## 2019-01-15 NOTE — Progress Notes (Signed)
Here for STD testing..Marrion Accomando Brewer-Jensen, RN  

## 2019-01-18 LAB — CT, NG, MYCOPLASMAS NAA, URINE
Chlamydia trachomatis, NAA: NEGATIVE
Mycoplasma genitalium NAA: NEGATIVE
Mycoplasma hominis NAA: POSITIVE — AB
Neisseria gonorrhoeae, NAA: POSITIVE — AB
Ureaplasma spp NAA: NEGATIVE

## 2019-09-13 ENCOUNTER — Inpatient Hospital Stay
Admission: EM | Admit: 2019-09-13 | Discharge: 2019-09-16 | DRG: 871 | Disposition: A | Discharge status: Home / Self Care | Payer: Self-pay | Attending: Hospitalist | Admitting: Hospitalist

## 2019-09-13 ENCOUNTER — Encounter: Payer: Self-pay | Admitting: Emergency Medicine

## 2019-09-13 ENCOUNTER — Emergency Department: Payer: Self-pay

## 2019-09-13 ENCOUNTER — Other Ambulatory Visit: Payer: Self-pay

## 2019-09-13 DIAGNOSIS — K219 Gastro-esophageal reflux disease without esophagitis: Secondary | ICD-10-CM | POA: Diagnosis present

## 2019-09-13 DIAGNOSIS — S022XXA Fracture of nasal bones, initial encounter for closed fracture: Secondary | ICD-10-CM | POA: Diagnosis present

## 2019-09-13 DIAGNOSIS — W08XXXA Fall from other furniture, initial encounter: Secondary | ICD-10-CM | POA: Diagnosis present

## 2019-09-13 DIAGNOSIS — M542 Cervicalgia: Secondary | ICD-10-CM | POA: Diagnosis present

## 2019-09-13 DIAGNOSIS — Z20822 Contact with and (suspected) exposure to covid-19: Secondary | ICD-10-CM | POA: Diagnosis present

## 2019-09-13 DIAGNOSIS — E872 Acidosis: Secondary | ICD-10-CM | POA: Diagnosis present

## 2019-09-13 DIAGNOSIS — F1410 Cocaine abuse, uncomplicated: Secondary | ICD-10-CM | POA: Diagnosis present

## 2019-09-13 DIAGNOSIS — R6521 Severe sepsis with septic shock: Secondary | ICD-10-CM | POA: Diagnosis present

## 2019-09-13 DIAGNOSIS — R609 Edema, unspecified: Secondary | ICD-10-CM | POA: Diagnosis present

## 2019-09-13 DIAGNOSIS — N179 Acute kidney failure, unspecified: Secondary | ICD-10-CM | POA: Diagnosis present

## 2019-09-13 DIAGNOSIS — N170 Acute kidney failure with tubular necrosis: Secondary | ICD-10-CM

## 2019-09-13 DIAGNOSIS — R6520 Severe sepsis without septic shock: Secondary | ICD-10-CM

## 2019-09-13 DIAGNOSIS — F419 Anxiety disorder, unspecified: Secondary | ICD-10-CM | POA: Diagnosis present

## 2019-09-13 DIAGNOSIS — E871 Hypo-osmolality and hyponatremia: Secondary | ICD-10-CM | POA: Diagnosis present

## 2019-09-13 DIAGNOSIS — F17210 Nicotine dependence, cigarettes, uncomplicated: Secondary | ICD-10-CM | POA: Diagnosis present

## 2019-09-13 DIAGNOSIS — A419 Sepsis, unspecified organism: Principal | ICD-10-CM | POA: Diagnosis present

## 2019-09-13 DIAGNOSIS — L03211 Cellulitis of face: Secondary | ICD-10-CM | POA: Diagnosis present

## 2019-09-13 DIAGNOSIS — S0990XA Unspecified injury of head, initial encounter: Secondary | ICD-10-CM

## 2019-09-13 DIAGNOSIS — Z888 Allergy status to other drugs, medicaments and biological substances status: Secondary | ICD-10-CM

## 2019-09-13 DIAGNOSIS — S0101XA Laceration without foreign body of scalp, initial encounter: Secondary | ICD-10-CM | POA: Diagnosis present

## 2019-09-13 LAB — URINE DRUG SCREEN, QUALITATIVE (ARMC ONLY)
Amphetamines, Ur Screen: POSITIVE — AB
Barbiturates, Ur Screen: NOT DETECTED
Benzodiazepine, Ur Scrn: NOT DETECTED
Cannabinoid 50 Ng, Ur ~~LOC~~: POSITIVE — AB
Cocaine Metabolite,Ur ~~LOC~~: POSITIVE — AB
MDMA (Ecstasy)Ur Screen: NOT DETECTED
Methadone Scn, Ur: NOT DETECTED
Opiate, Ur Screen: NOT DETECTED
Phencyclidine (PCP) Ur S: NOT DETECTED
Tricyclic, Ur Screen: NOT DETECTED

## 2019-09-13 LAB — CBC WITH DIFFERENTIAL/PLATELET
Abs Immature Granulocytes: 0.3 10*3/uL — ABNORMAL HIGH (ref 0.00–0.07)
Basophils Absolute: 0.1 10*3/uL (ref 0.0–0.1)
Basophils Relative: 1 %
Eosinophils Absolute: 0 10*3/uL (ref 0.0–0.5)
Eosinophils Relative: 0 %
HCT: 40.7 % (ref 39.0–52.0)
Hemoglobin: 14.2 g/dL (ref 13.0–17.0)
Immature Granulocytes: 2 %
Lymphocytes Relative: 1 %
Lymphs Abs: 0.2 10*3/uL — ABNORMAL LOW (ref 0.7–4.0)
MCH: 29.5 pg (ref 26.0–34.0)
MCHC: 34.9 g/dL (ref 30.0–36.0)
MCV: 84.6 fL (ref 80.0–100.0)
Monocytes Absolute: 0.6 10*3/uL (ref 0.1–1.0)
Monocytes Relative: 4 %
Neutro Abs: 14.3 10*3/uL — ABNORMAL HIGH (ref 1.7–7.7)
Neutrophils Relative %: 92 %
Platelets: 235 10*3/uL (ref 150–400)
RBC: 4.81 MIL/uL (ref 4.22–5.81)
RDW: 12.1 % (ref 11.5–15.5)
WBC: 15.6 10*3/uL — ABNORMAL HIGH (ref 4.0–10.5)
nRBC: 0 % (ref 0.0–0.2)

## 2019-09-13 LAB — COMPREHENSIVE METABOLIC PANEL
ALT: 26 U/L (ref 0–44)
AST: 26 U/L (ref 15–41)
Albumin: 3.7 g/dL (ref 3.5–5.0)
Alkaline Phosphatase: 48 U/L (ref 38–126)
Anion gap: 12 (ref 5–15)
BUN: 31 mg/dL — ABNORMAL HIGH (ref 6–20)
CO2: 22 mmol/L (ref 22–32)
Calcium: 8.4 mg/dL — ABNORMAL LOW (ref 8.9–10.3)
Chloride: 96 mmol/L — ABNORMAL LOW (ref 98–111)
Creatinine, Ser: 1.73 mg/dL — ABNORMAL HIGH (ref 0.61–1.24)
GFR calc Af Amer: 56 mL/min — ABNORMAL LOW (ref >60)
GFR calc non Af Amer: 48 mL/min — ABNORMAL LOW (ref >60)
Glucose, Bld: 94 mg/dL (ref 70–99)
Potassium: 4 mmol/L (ref 3.5–5.1)
Sodium: 130 mmol/L — ABNORMAL LOW (ref 135–145)
Total Bilirubin: 2.2 mg/dL — ABNORMAL HIGH (ref 0.3–1.2)
Total Protein: 6.5 g/dL (ref 6.5–8.1)

## 2019-09-13 LAB — ETHANOL: Alcohol, Ethyl (B): <10 mg/dL (ref <10)

## 2019-09-13 LAB — LACTIC ACID, PLASMA
Lactic Acid, Venous: 2 mmol/L (ref 0.5–1.9)
Lactic Acid, Venous: 2.1 mmol/L (ref 0.5–1.9)

## 2019-09-13 LAB — SARS CORONAVIRUS 2 BY RT PCR (HOSPITAL ORDER, PERFORMED IN ~~LOC~~ HOSPITAL LAB): SARS Coronavirus 2: NEGATIVE

## 2019-09-13 MED ORDER — HYDROMORPHONE HCL 1 MG/ML IJ SOLN
1.0000 mg | INTRAMUSCULAR | Status: DC | PRN
  Administered 2019-09-13 – 2019-09-14 (×2): 1 mg via INTRAVENOUS
  Filled 2019-09-13 (×2): qty 1

## 2019-09-13 MED ORDER — ONDANSETRON HCL 4 MG PO TABS: 4.0000 mg | ORAL_TABLET | Freq: Four times a day (QID) | ORAL | Status: DC | PRN

## 2019-09-13 MED ORDER — SODIUM CHLORIDE 0.9 % IV SOLN: INTRAVENOUS | Status: DC

## 2019-09-13 MED ORDER — SODIUM CHLORIDE 0.9 % IV SOLN: 2.0000 g | Freq: Once | INTRAVENOUS | Status: DC

## 2019-09-13 MED ORDER — GENTAMICIN SULFATE 0.1 % EX OINT
TOPICAL_OINTMENT | Freq: Three times a day (TID) | CUTANEOUS | Status: DC
  Administered 2019-09-14: 1 via TOPICAL
  Filled 2019-09-13 (×2): qty 15

## 2019-09-13 MED ORDER — HYDROMORPHONE HCL 1 MG/ML IJ SOLN
0.5000 mg | Freq: Once | INTRAMUSCULAR | Status: AC
  Administered 2019-09-13: 0.5 mg via INTRAVENOUS
  Filled 2019-09-13: qty 1

## 2019-09-13 MED ORDER — VANCOMYCIN HCL IN DEXTROSE 1-5 GM/200ML-% IV SOLN: 1000.0000 mg | Freq: Once | INTRAVENOUS | Status: DC

## 2019-09-13 MED ORDER — LINEZOLID 600 MG/300ML IV SOLN
600.0000 mg | Freq: Two times a day (BID) | INTRAVENOUS | Status: DC
  Administered 2019-09-13 – 2019-09-14 (×3): 600 mg via INTRAVENOUS
  Filled 2019-09-13 (×6): qty 300

## 2019-09-13 MED ORDER — PIPERACILLIN-TAZOBACTAM 3.375 G IVPB 30 MIN
3.3750 g | Freq: Once | INTRAVENOUS | Status: AC
  Administered 2019-09-13: 3.375 g via INTRAVENOUS
  Filled 2019-09-13: qty 50

## 2019-09-13 MED ORDER — DEXAMETHASONE SODIUM PHOSPHATE 10 MG/ML IJ SOLN
10.0000 mg | Freq: Once | INTRAMUSCULAR | Status: AC
  Administered 2019-09-13: 10 mg via INTRAVENOUS
  Filled 2019-09-13: qty 1

## 2019-09-13 MED ORDER — VANCOMYCIN HCL IN DEXTROSE 1-5 GM/200ML-% IV SOLN
1000.0000 mg | Freq: Once | INTRAVENOUS | Status: AC
  Administered 2019-09-13: 1000 mg via INTRAVENOUS
  Filled 2019-09-13: qty 200

## 2019-09-13 MED ORDER — ONDANSETRON HCL 4 MG/2ML IJ SOLN: 4.0000 mg | Freq: Four times a day (QID) | INTRAMUSCULAR | Status: DC | PRN

## 2019-09-13 MED ORDER — SODIUM CHLORIDE 0.9 % IV SOLN: Freq: Once | INTRAVENOUS | Status: AC

## 2019-09-13 MED ORDER — LACTATED RINGERS IV BOLUS
1000.0000 mL | Freq: Once | INTRAVENOUS | Status: AC
  Administered 2019-09-13: 1000 mL via INTRAVENOUS

## 2019-09-13 MED ORDER — DEXAMETHASONE SODIUM PHOSPHATE 4 MG/ML IJ SOLN
4.0000 mg | Freq: Four times a day (QID) | INTRAMUSCULAR | Status: DC
  Administered 2019-09-13 – 2019-09-15 (×7): 4 mg via INTRAVENOUS
  Filled 2019-09-13 (×8): qty 1

## 2019-09-13 MED ORDER — METRONIDAZOLE IN NACL 5-0.79 MG/ML-% IV SOLN
500.0000 mg | Freq: Three times a day (TID) | INTRAVENOUS | Status: DC
  Administered 2019-09-13 – 2019-09-15 (×6): 500 mg via INTRAVENOUS
  Filled 2019-09-13 (×9): qty 100

## 2019-09-13 MED ORDER — LIDOCAINE-EPINEPHRINE 2 %-1:100000 IJ SOLN: 10.0000 mL | Freq: Once | INTRAMUSCULAR | Status: DC

## 2019-09-13 MED ORDER — ENOXAPARIN SODIUM 40 MG/0.4ML ~~LOC~~ SOLN
40.0000 mg | SUBCUTANEOUS | Status: DC
  Administered 2019-09-13 – 2019-09-15 (×3): 40 mg via SUBCUTANEOUS
  Filled 2019-09-13 (×3): qty 0.4

## 2019-09-13 NOTE — ED Notes (Signed)
Per ED Charge and Nursing Supervisor, there is a issue with bed availability and staffing at this time. When the pt is able to be admitted or taken to the floor I will be notified via the Nursing Supervisor. Pt made aware

## 2019-09-13 NOTE — ED Triage Notes (Signed)
Pt reports hit head on refrigerator Thursday night and then rolled of couch hitting forehead during night last night.  Redness across forehead. Significant swelling over forehead, periorbital bilateral, and over both temporal area.  Severe pain to head. + vomiting.

## 2019-09-13 NOTE — ED Notes (Signed)
Assigned bed @ [id number], spoke with RN jennifer.

## 2019-09-13 NOTE — Consult Note (Signed)
Philip Owen, Philip Owen [id number] 1976/01/28 Sandi Mealy, MD  Reason for Consult: Facial swelling, nasal fracture Requesting Physician: Lucile Shutters, MD Consulting Physician: Sandi Mealy, MD  HPI: This 44 y.o. year old male was admitted on 09/13/2019 for Sepsis (HCC) [A41.9]. Reports hitting his head sometime Thursday against a refrigerator apparently lacerating the scalp, then a second injury where he fell on his face, and appears to have broken his nose. CT shows only a mildly displaced nasal fracture as far as bony injury, but has significant soft tissue edema of the head in the frontotemporal area and extends down to right neck. Denies fevers, though had some chills.   Medications:  Current Facility-Administered Medications  Medication Dose Route Frequency Provider Last Rate Last Admin  . 0.9 %  sodium chloride infusion   Intravenous Continuous Agbata, Tochukwu, MD      . dexamethasone (DECADRON) injection 4 mg  4 mg Intravenous Q6H Agbata, Tochukwu, MD      . enoxaparin (LOVENOX) injection 40 mg  40 mg Subcutaneous Q24H Agbata, Tochukwu, MD      . HYDROmorphone (DILAUDID) injection 1 mg  1 mg Intravenous Q4H PRN Agbata, Tochukwu, MD      . linezolid (ZYVOX) IVPB 600 mg  600 mg Intravenous Q12H Agbata, Tochukwu, MD      . metroNIDAZOLE (FLAGYL) IVPB 500 mg  500 mg Intravenous Q8H Agbata, Tochukwu, MD      . ondansetron (ZOFRAN) tablet 4 mg  4 mg Oral Q6H PRN Agbata, Tochukwu, MD       Or  . ondansetron (ZOFRAN) injection 4 mg  4 mg Intravenous Q6H PRN Agbata, Tochukwu, MD       Current Outpatient Medications  Medication Sig Dispense Refill  . esomeprazole (NEXIUM 24HR) 20 MG capsule Take 20 mg by mouth daily at 12 noon.    . (Not in a hospital admission)   Allergies:  Allergies  Allergen Reactions  . Tramadol Itching  . Naproxen     Itching     PMH:  Past Medical History:  Diagnosis Date  . Acid reflux   . Anxiety   . GERD (gastroesophageal reflux disease)     Fam  Hx:  Family History  Problem Relation Age of Onset  . Brain cancer Father     Soc Hx:  Social History   Socioeconomic History  . Marital status: Legally Separated    Spouse name: Not on file  . Number of children: Not on file  . Years of education: Not on file  . Highest education level: Not on file  Occupational History  . Not on file  Tobacco Use  . Smoking status: Current Every Day Smoker    Packs/day: 0.50    Years: 20.00    Pack years: 10.00  . Smokeless tobacco: Never Used  Substance and Sexual Activity  . Alcohol use: No  . Drug use: Not Currently    Types: Cocaine  . Sexual activity: Yes  Other Topics Concern  . Not on file  Social History Narrative  . Not on file   Social Determinants of Health   Financial Resource Strain:   . Difficulty of Paying Living Expenses:   Food Insecurity:   . Worried About Programme researcher, broadcasting/film/video in the Last Year:   . Barista in the Last Year:   Transportation Needs:   . Freight forwarder (Medical):   Marland Kitchen Lack of Transportation (Non-Medical):   Physical Activity:   . Days of  Exercise per Week:   . Minutes of Exercise per Session:   Stress:   . Feeling of Stress :   Social Connections:   . Frequency of Communication with Friends and Family:   . Frequency of Social Gatherings with Friends and Family:   . Attends Religious Services:   . Active Member of Clubs or Organizations:   . Attends Archivist Meetings:   Marland Kitchen Marital Status:   Intimate Partner Violence:   . Fear of Current or Ex-Partner:   . Emotionally Abused:   Marland Kitchen Physically Abused:   . Sexually Abused:     PSH:  Past Surgical History:  Procedure Laterality Date  . none    . Procedures since admission: No admission procedures for hospital encounter.  ROS: Review of systems normal other than 12 systems except per HPI.  PHYSICAL EXAM  Vitals: Blood pressure 96/68, pulse (!) 121, temperature 98 F (36.7 C), temperature source Oral, resp. rate  10, height 5\' 8"  (1.727 m), weight 74.8 kg, SpO2 99 %.. General: Well-developed, Well-nourished in no acute distress Mood: Mood and affect well adjusted, pleasant and cooperative. Orientation: Grossly alert and oriented. Vocal Quality: No hoarseness. Communicates verbally. head and Face: Laceration to left of midline of scalp just inside the hairline. Edema with mild erythema, warmth and tenderness of the forehead with eyes swollen shut, edema of scalp and temples.Facial strength normal and symmetric. Ears: External ears with normal landmarks, no lesions. External auditory canals free of infection, cerumen impaction or lesions. Tympanic membranes intact with good landmarks and normal mobility on pneumatic otoscopy. No middle ear effusion. Hearing: Speech reception grossly normal. Nose: External nose mild edema, minimal stepoff deformity on the left. Nasal Cavity reveals mild septal deviation without hematoma and with normal inferior turbinates. No significant mucosal congestion or erythema. Nasal secretions are minimal and clear. No polyps seen on anterior rhinoscopy. Oral Cavity/ Oropharynx: Lips are normal with no lesions. Teeth no frank dental caries. Gingiva healthy with no lesions or gingivitis. Oropharynx including tongue, buccal mucosa, floor of mouth, hard and soft palate, uvula and posterior pharynx free of exudates, erythema or lesions with normal symmetry and hydration.  Indirect Laryngoscopy/Nasopharyngoscopy: Visualization of the larynx, hypopharynx and nasopharynx is not possible in this setting with routine examination. Neck: Skin warm and tender to touch. No crepitance, mild edema. No palpable masses, tenderness or crepitance. The trachea is midline. Thyroid gland is soft, nontender and symmetric with no masses or enlargement. Parotid and submandibular glands are soft, nontender and symmetric, without masses. Lymphatic: Cervical lymph nodes are without palpable lymphadenopathy or  tenderness. Respiratory: Normal respiratory effort without labored breathing. Cardiovascular: Carotid pulse shows regular rate and rhythm Neurologic: Cranial Nerves II through XII are grossly intact. Eyes: Swollen shut from preseptal edema  MEDICAL DECISION MAKING: Data Review:  Results for orders placed or performed during the hospital encounter of 09/13/19 (from the past 48 hour(s))  CBC with Differential     Status: Abnormal   Collection Time: 09/13/19  1:28 PM  Result Value Ref Range   WBC 15.6 (H) 4.0 - 10.5 K/uL   RBC 4.81 4.22 - 5.81 MIL/uL   Hemoglobin 14.2 13.0 - 17.0 g/dL   HCT 40.7 39.0 - 52.0 %   MCV 84.6 80.0 - 100.0 fL   MCH 29.5 26.0 - 34.0 pg   MCHC 34.9 30.0 - 36.0 g/dL   RDW 12.1 11.5 - 15.5 %   Platelets 235 150 - 400 K/uL   nRBC 0.0 0.0 -  0.2 %   Neutrophils Relative % 92 %   Neutro Abs 14.3 (H) 1.7 - 7.7 K/uL   Lymphocytes Relative 1 %   Lymphs Abs 0.2 (L) 0.7 - 4.0 K/uL   Monocytes Relative 4 %   Monocytes Absolute 0.6 0.1 - 1.0 K/uL   Eosinophils Relative 0 %   Eosinophils Absolute 0.0 0.0 - 0.5 K/uL   Basophils Relative 1 %   Basophils Absolute 0.1 0.0 - 0.1 K/uL   Immature Granulocytes 2 %   Abs Immature Granulocytes 0.30 (H) 0.00 - 0.07 K/uL    Comment: Performed at [facility], [address] [place], [place] [address]  Comprehensive metabolic panel     Status: Abnormal   Collection Time: 09/13/19  1:28 PM  Result Value Ref Range   Sodium 130 (L) 135 - 145 mmol/L   Potassium 4.0 3.5 - 5.1 mmol/L   [place] 96 (L) 98 - 111 mmol/L   CO2 22 22 - 32 mmol/L   Glucose, Bld 94 70 - 99 mg/dL    Comment: Glucose reference range applies only to samples taken after fasting for at least 8 hours.   BUN 31 (H) 6 - 20 mg/dL   Creatinine, Ser 1.82 (H) 0.61 - 1.24 mg/dL   Calcium 8.4 (L) 8.9 - 10.3 mg/dL   Total Protein 6.5 6.5 - 8.1 g/dL   Albumin 3.7 3.5 - 5.0 g/dL   AST 26 15 - 41 U/L   ALT 26 0 - 44 U/L   Alkaline Phosphatase 48 38 - 126  U/L   Total Bilirubin 2.2 (H) 0.3 - 1.2 mg/dL   GFR calc non Af Amer 48 (L) >60 mL/min   GFR calc Af Amer 56 (L) >60 mL/min   Anion gap 12 5 - 15    Comment: Performed at [facility], [address] [place], [place] [address]  Lactic acid, plasma     Status: Abnormal   Collection Time: 09/13/19  1:28 PM  Result Value Ref Range   Lactic Acid, Venous 2.0 (HH) 0.5 - 1.9 mmol/L    Comment: CRITICAL RESULT CALLED TO, READ BACK BY AND VERIFIED WITH JENNIFER WHITLEY 1412 09/13/2019 DB Performed at [facility], [address] [place], [place] [address]   Ethanol     Status: None   Collection Time: 09/13/19  1:28 PM  Result Value Ref Range   Alcohol, Ethyl (B) <10 <10 mg/dL    Comment: (NOTE) Lowest detectable limit for serum alcohol is 10 mg/dL. For medical purposes only. Performed at [facility], [address] [place], [place] [address]   Lactic acid, plasma     Status: Abnormal   Collection Time: 09/13/19  3:36 PM  Result Value Ref Range   Lactic Acid, Venous 2.1 (HH) 0.5 - 1.9 mmol/L    Comment: CRITICAL RESULT CALLED TO, READ BACK BY AND VERIFIED WITH JENNIFER WHITLEY @[id number]  09/13/19 MJU Performed at [facility], [address] [place], [place]   Urine Drug Screen, Qualitative ([facility] only)     Status: Abnormal   Collection Time: 09/13/19  3:36 PM  Result Value Ref Range   Tricyclic, Ur Screen NONE DETECTED NONE DETECTED   Amphetamines, Ur Screen POSITIVE (A) NONE DETECTED   MDMA (Ecstasy)Ur Screen NONE DETECTED NONE DETECTED   Cocaine Metabolite,Ur [place] POSITIVE (A) NONE DETECTED   Opiate, Ur Screen NONE DETECTED NONE DETECTED   Phencyclidine (PCP) Ur S NONE DETECTED NONE DETECTED   Cannabinoid  50 Ng, Ur [place] POSITIVE (A) NONE DETECTED   Barbiturates, Ur Screen NONE DETECTED NONE DETECTED   Benzodiazepine, Ur Scrn NONE DETECTED NONE DETECTED   Methadone Scn, Ur NONE DETECTED NONE DETECTED    Comment: (NOTE) Tricyclics +  metabolites, urine    Cutoff 1000 ng/mL Amphetamines + metabolites, urine  Cutoff 1000 ng/mL MDMA (Ecstasy), urine              Cutoff 500 ng/mL Cocaine Metabolite, urine          Cutoff 300 ng/mL Opiate + metabolites, urine        Cutoff 300 ng/mL Phencyclidine (PCP), urine         Cutoff 25 ng/mL Cannabinoid, urine                 Cutoff 50 ng/mL Barbiturates + metabolites, urine  Cutoff 200 ng/mL Benzodiazepine, urine              Cutoff 200 ng/mL Methadone, urine                   Cutoff 300 ng/mL The urine drug screen provides only a preliminary, unconfirmed analytical test result and should not be used for non-medical purposes. Clinical consideration and professional judgment should be applied to any positive drug screen result due to possible interfering substances. A more specific alternate chemical method must be used in order to obtain a confirmed analytical result. Gas chromatography / mass spectrometry (GC/MS) is the preferred confirmat ory method. Performed at [facility], [address], [place], [place] [address]   . CT Head Wo Contrast  Result Date: 09/13/2019 CLINICAL DATA:  Hit head on refrigerator. Redness across forehead significant swelling with severe pain and vomiting EXAM: CT HEAD WITHOUT CONTRAST CT MAXILLOFACIAL WITHOUT CONTRAST CT CERVICAL SPINE WITHOUT CONTRAST TECHNIQUE: Multidetector CT imaging of the head, cervical spine, and maxillofacial structures were performed using the standard protocol without intravenous contrast. Multiplanar CT image reconstructions of the cervical spine and maxillofacial structures were also generated. COMPARISON:  CT head from 03/15/2018 FINDINGS: CT HEAD FINDINGS Brain: No evidence of acute infarction, hemorrhage, hydrocephalus, extra-axial collection or mass lesion/mass effect. Signs of white matter disease and atrophy perhaps advanced for age as discussed on prior imaging studies. Vascular: No hyperdense vessel or  unexpected calcification. Skull: Signs of facial fractures, see below. Fractures of the nasal bone with mild displacement are noted. Other: Extensive soft tissue swelling overlying the bilateral orbits and frontal area tracking into the RIGHT and LEFT scalp overlying the temporalis musculature bilaterally. CT MAXILLOFACIAL FINDINGS Osseous: Bilateral nasal bone fractures with mild displacement. Zygomatic arches are intact. No signs of mastoid effusion. Pterygoid are intact. Sinuses are clear. No visible mandibular fracture. Orbits: Marked preseptal swelling. No retro bulbar stranding. Sinuses: Clear. Soft tissues: Extensive soft tissue swelling about the face tracking into the scalp along the RIGHT and LEFT scalp and along the RIGHT and LEFT face with extensive edema in the soft tissues of the scalp and face. Extends posteriorly bilaterally to approximately the parietal region. CT CERVICAL SPINE FINDINGS Alignment: Normal. Skull base and vertebrae: No acute fracture. No primary bone lesion or focal pathologic process. Soft tissues and spinal canal: Stranding extending along the bilateral face along the angle of the mandible bilaterally and into the neck on the RIGHT. Disc levels: Mild degenerative changes throughout the spine greatest at C5-6. Upper chest: Negative. Other: None IMPRESSION: 1. No acute intracranial abnormality. Signs of white matter disease and atrophy perhaps  advanced for age, correlate clinically with MRI follow-up on nonemergent basis as clinically warranted. 2. Bilateral nasal bone fractures with mild displacement. 3. Extensive soft tissue swelling about the face tracking into the scalp and face with extensive edema, also tracking into the RIGHT upper neck antro laterally. 4. Given the extensive nature of the edema would also correlate with any signs of cellulitis or infection. 5. No evidence of acute fracture or subluxation of the cervical spine. 6. Mild degenerative changes throughout the spine  greatest at C5-6. Electronically Signed   By: Donzetta Kohut M.D.   On: 09/13/2019 13:53   CT Cervical Spine Wo Contrast  Result Date: 09/13/2019 CLINICAL DATA:  Hit head on refrigerator. Redness across forehead significant swelling with severe pain and vomiting EXAM: CT HEAD WITHOUT CONTRAST CT MAXILLOFACIAL WITHOUT CONTRAST CT CERVICAL SPINE WITHOUT CONTRAST TECHNIQUE: Multidetector CT imaging of the head, cervical spine, and maxillofacial structures were performed using the standard protocol without intravenous contrast. Multiplanar CT image reconstructions of the cervical spine and maxillofacial structures were also generated. COMPARISON:  CT head from 03/15/2018 FINDINGS: CT HEAD FINDINGS Brain: No evidence of acute infarction, hemorrhage, hydrocephalus, extra-axial collection or mass lesion/mass effect. Signs of white matter disease and atrophy perhaps advanced for age as discussed on prior imaging studies. Vascular: No hyperdense vessel or unexpected calcification. Skull: Signs of facial fractures, see below. Fractures of the nasal bone with mild displacement are noted. Other: Extensive soft tissue swelling overlying the bilateral orbits and frontal area tracking into the RIGHT and LEFT scalp overlying the temporalis musculature bilaterally. CT MAXILLOFACIAL FINDINGS Osseous: Bilateral nasal bone fractures with mild displacement. Zygomatic arches are intact. No signs of mastoid effusion. Pterygoid are intact. Sinuses are clear. No visible mandibular fracture. Orbits: Marked preseptal swelling. No retro bulbar stranding. Sinuses: Clear. Soft tissues: Extensive soft tissue swelling about the face tracking into the scalp along the RIGHT and LEFT scalp and along the RIGHT and LEFT face with extensive edema in the soft tissues of the scalp and face. Extends posteriorly bilaterally to approximately the parietal region. CT CERVICAL SPINE FINDINGS Alignment: Normal. Skull base and vertebrae: No acute fracture. No  primary bone lesion or focal pathologic process. Soft tissues and spinal canal: Stranding extending along the bilateral face along the angle of the mandible bilaterally and into the neck on the RIGHT. Disc levels: Mild degenerative changes throughout the spine greatest at C5-6. Upper chest: Negative. Other: None IMPRESSION: 1. No acute intracranial abnormality. Signs of white matter disease and atrophy perhaps advanced for age, correlate clinically with MRI follow-up on nonemergent basis as clinically warranted. 2. Bilateral nasal bone fractures with mild displacement. 3. Extensive soft tissue swelling about the face tracking into the scalp and face with extensive edema, also tracking into the RIGHT upper neck antro laterally. 4. Given the extensive nature of the edema would also correlate with any signs of cellulitis or infection. 5. No evidence of acute fracture or subluxation of the cervical spine. 6. Mild degenerative changes throughout the spine greatest at C5-6. Electronically Signed   By: Donzetta Kohut M.D.   On: 09/13/2019 13:53   CT Maxillofacial Wo Contrast  Result Date: 09/13/2019 CLINICAL DATA:  Hit head on refrigerator. Redness across forehead significant swelling with severe pain and vomiting EXAM: CT HEAD WITHOUT CONTRAST CT MAXILLOFACIAL WITHOUT CONTRAST CT CERVICAL SPINE WITHOUT CONTRAST TECHNIQUE: Multidetector CT imaging of the head, cervical spine, and maxillofacial structures were performed using the standard protocol without intravenous contrast. Multiplanar CT image reconstructions  of the cervical spine and maxillofacial structures were also generated. COMPARISON:  CT head from 03/15/2018 FINDINGS: CT HEAD FINDINGS Brain: No evidence of acute infarction, hemorrhage, hydrocephalus, extra-axial collection or mass lesion/mass effect. Signs of white matter disease and atrophy perhaps advanced for age as discussed on prior imaging studies. Vascular: No hyperdense vessel or unexpected  calcification. Skull: Signs of facial fractures, see below. Fractures of the nasal bone with mild displacement are noted. Other: Extensive soft tissue swelling overlying the bilateral orbits and frontal area tracking into the RIGHT and LEFT scalp overlying the temporalis musculature bilaterally. CT MAXILLOFACIAL FINDINGS Osseous: Bilateral nasal bone fractures with mild displacement. Zygomatic arches are intact. No signs of mastoid effusion. Pterygoid are intact. Sinuses are clear. No visible mandibular fracture. Orbits: Marked preseptal swelling. No retro bulbar stranding. Sinuses: Clear. Soft tissues: Extensive soft tissue swelling about the face tracking into the scalp along the RIGHT and LEFT scalp and along the RIGHT and LEFT face with extensive edema in the soft tissues of the scalp and face. Extends posteriorly bilaterally to approximately the parietal region. CT CERVICAL SPINE FINDINGS Alignment: Normal. Skull base and vertebrae: No acute fracture. No primary bone lesion or focal pathologic process. Soft tissues and spinal canal: Stranding extending along the bilateral face along the angle of the mandible bilaterally and into the neck on the RIGHT. Disc levels: Mild degenerative changes throughout the spine greatest at C5-6. Upper chest: Negative. Other: None IMPRESSION: 1. No acute intracranial abnormality. Signs of white matter disease and atrophy perhaps advanced for age, correlate clinically with MRI follow-up on nonemergent basis as clinically warranted. 2. Bilateral nasal bone fractures with mild displacement. 3. Extensive soft tissue swelling about the face tracking into the scalp and face with extensive edema, also tracking into the RIGHT upper neck antro laterally. 4. Given the extensive nature of the edema would also correlate with any signs of cellulitis or infection. 5. No evidence of acute fracture or subluxation of the cervical spine. 6. Mild degenerative changes throughout the spine greatest  at C5-6. Electronically Signed   By: Donzetta KohutGeoffrey  Wile M.D.   On: 09/13/2019 13:53  .   ASSESSMENT: Very unusual, but appears to have rapidly developed facial cellulitis after scalp laceration. Does have mildly displaced nasal fracture, though this may not require intervention as there is no significant cosmetic deformity. Can reassess later as edema resolved.  PLAN: Agree with admission for IV antibiotics with coverage for staph including MRSA. Can give Decadron to help with the edema. Keep head elevated and can use ice packs prn. Gentamicin to scalp laceration TID, clean as needed with peroxide. Obviously would not attempt closure, just heal by secondary intention.Assuming improvement over the weekend, would plan rechecking him late next week in the office to re-assess the nasal fracture.   Sandi MealyPaul S Zhavia Cunanan, MD 09/13/2019 5:17 PM

## 2019-09-13 NOTE — ED Notes (Signed)
Attempted to call report, floor unable to accept at this time. 

## 2019-09-13 NOTE — Progress Notes (Addendum)
Pharmacy Antibiotic Note  Philip Owen is a 44 y.o. male admitted on 09/13/2019 with sepsis secondary to facial cellulitis. Patient with past medical history significant for GERD and substance abuse. Patient received Zosyn and vancomycin x 1 in the ED and is being covered with cefepime and linezolid on admission. Patient is currently in AKI. Pharmacy has been consulted for cefepime dosing.  Plan: Cefepime 2g IV Q12hr.   Linezolid 600mg  IV Q12hr.   Serum creatinine with am labs.   Height: 5\' 8"  (172.7 cm) Weight: 74.8 kg (165 lb) IBW/kg (Calculated) : 68.4  Temp (24hrs), Avg:98 F (36.7 C), Min:98 F (36.7 C), Max:98 F (36.7 C)  Recent Labs  Lab 09/13/19 1328 09/13/19 1536  WBC 15.6*  --   CREATININE 1.73*  --   LATICACIDVEN 2.0* 2.1*    Estimated Creatinine Clearance: 54.4 mL/min (A) (by C-G formula based on SCr of 1.73 mg/dL (H)).    Allergies  Allergen Reactions  . Tramadol Itching  . Naproxen     Itching     Antimicrobials this admission: Vancomycin 5/14 x 1 Zosyn 5/14 x 1 Cefepime 5/14 >> Linezolid 5/14 >>  Metronidazole 5/14 >>   Dose adjustments this admission: N/A  Microbiology results: 5/14 BCx: pending 5/14 Wound Culture: pending   Thank you for allowing pharmacy to be a part of this patient's care.  Johan Antonacci L 09/13/2019 5:01 PM

## 2019-09-13 NOTE — ED Notes (Signed)
NP Annabelle Harman at bedside

## 2019-09-13 NOTE — ED Notes (Signed)
Date and time results received: 09/13/19 2:13 PM  Test: Lactic Critical Value: 2.0  Name of Provider Notified: Mayford Knife

## 2019-09-13 NOTE — H&P (Addendum)
History and Physical    Philip Owen ZOX:[id number] DOB: June 03, 1975 DOA: 09/13/2019  PCP: Patient, No Pcp Per   Patient coming from: Home  I have personally briefly reviewed patient's old medical records in [facility]  Chief Complaint: Facial swelling  HPI: Philip Owen is a 44 y.o. male with medical history significant for GERD and substance abuse who was brought into the emergency room by his father for evaluation of facial swelling.  Patient states that he had hit his head on the refrigerator the night prior to his admission and he also states that he fell off the couch and face planted on the floor.  He presents with massive forehead swelling and facial swelling as well as inability to open his eyes.  He also complains of severe headache associated with vomiting. Labs reveal a white count of 15.6 with a left shift, lactic acid of 2.0 and serum creatinine of 1.73. He had a CT scan of the head, maxillofacial and C-spine which showed bilateral nasal bone fractures with mild displacement. Extensive soft tissue swelling about the face tracking into the scalp and face with extensive edema, also tracking into the RIGHT upper neck antro laterally. He denies having any chest pain, shortness of breath, dizziness, lightheadedness, abdominal pain or any changes in his bowel habits.   ED Course: Patient seen in the emergency room for evaluation of head injury with massive facial and scalp swelling.  CT scan consistent with soft tissue swelling of his face and bilateral nasal bone fractures with no evidence of an abscess.  Patient has been tachycardic with relative hypotension.  He received IV vancomycin and Zosyn in the emergency room.  Review of Systems: As per HPI otherwise 10 point review of systems negative.    Past Medical History:  Diagnosis Date  . Acid reflux   . Anxiety   . GERD (gastroesophageal reflux disease)     Past Surgical History:  Procedure Laterality Date  .  none       reports that he has been smoking. He has a 10.00 pack-year smoking history. He has never used smokeless tobacco. He reports previous drug use. Drug: Cocaine. He reports that he does not drink alcohol.  Allergies  Allergen Reactions  . Tramadol Itching  . Naproxen     Itching     Family History  Problem Relation Age of Onset  . Brain cancer Father      Prior to Admission medications   Not on File    Physical Exam: Vitals:   09/13/19 1400 09/13/19 1430 09/13/19 1500 09/13/19 1530  BP: 93/68 (!) 84/65 94/65 (!) 89/63  Pulse: (!) 203 (!) 123 (!) 123 (!) 122  Resp: 14 16 18  (!) 22  Temp:      TempSrc:      SpO2: 100% 98% 99% 100%  Weight:      Height:         Vitals:   09/13/19 1400 09/13/19 1430 09/13/19 1500 09/13/19 1530  BP: 93/68 (!) 84/65 94/65 (!) 89/63  Pulse: (!) 203 (!) 123 (!) 123 (!) 122  Resp: 14 16 18  (!) 22  Temp:      TempSrc:      SpO2: 100% 98% 99% 100%  Weight:      Height:        Constitutional: NAD, alert and oriented x 3. Eyes: Periorbital swelling and eyes are closed tightly shut ENMT: Mucous membranes are moist. Marked swelling involving the face and  extending to the neck with redness, laceration over the forehead Neck: normal, supple, no masses, no thyromegaly Respiratory: clear to auscultation bilaterally, no wheezing, no crackles. Normal respiratory effort. No accessory muscle use.  Cardiovascular: Tachycardic, no murmurs / rubs / gallops. No extremity edema. 2+ pedal pulses. No carotid bruits.  Abdomen: no tenderness, no masses palpated. No hepatosplenomegaly. Bowel sounds positive.  Musculoskeletal: no clubbing / cyanosis. No joint deformity upper and lower extremities.  Skin: no rashes, lesions, ulcers.  Neurologic: No gross focal neurologic deficit. Psychiatric: Normal mood and affect.   Labs on Admission: I have personally reviewed following labs and imaging studies  CBC: Recent Labs  Lab 09/13/19 1328  WBC 15.6*   NEUTROABS 14.3*  HGB 14.2  HCT 40.7  MCV 84.6  PLT 235   Basic Metabolic Panel: Recent Labs  Lab 09/13/19 1328  NA 130*  K 4.0  CL 96*  CO2 22  GLUCOSE 94  BUN 31*  CREATININE 1.73*  CALCIUM 8.4*   GFR: Estimated Creatinine Clearance: 54.4 mL/min (A) (by C-G formula based on SCr of 1.73 mg/dL (H)). Liver Function Tests: Recent Labs  Lab 09/13/19 1328  AST 26  ALT 26  ALKPHOS 48  BILITOT 2.2*  PROT 6.5  ALBUMIN 3.7   No results for input(s): LIPASE, AMYLASE in the last 168 hours. No results for input(s): AMMONIA in the last 168 hours. Coagulation Profile: No results for input(s): INR, PROTIME in the last 168 hours. Cardiac Enzymes: No results for input(s): CKTOTAL, CKMB, CKMBINDEX, TROPONINI in the last 168 hours. BNP (last 3 results) No results for input(s): PROBNP in the last 8760 hours. HbA1C: No results for input(s): HGBA1C in the last 72 hours. CBG: No results for input(s): GLUCAP in the last 168 hours. Lipid Profile: No results for input(s): CHOL, HDL, LDLCALC, TRIG, CHOLHDL, LDLDIRECT in the last 72 hours. Thyroid Function Tests: No results for input(s): TSH, T4TOTAL, FREET4, T3FREE, THYROIDAB in the last 72 hours. Anemia Panel: No results for input(s): VITAMINB12, FOLATE, FERRITIN, TIBC, IRON, RETICCTPCT in the last 72 hours. Urine analysis:    Component Value Date/Time   COLORURINE YELLOW (A) 03/15/2018 1032   APPEARANCEUR HAZY (A) 03/15/2018 1032   APPEARANCEUR Clear 12/13/2012 0549   LABSPEC 1.018 03/15/2018 1032   LABSPEC 1.025 12/13/2012 0549   PHURINE 5.0 03/15/2018 1032   GLUCOSEU >=500 (A) 03/15/2018 1032   GLUCOSEU Negative 12/13/2012 0549   HGBUR NEGATIVE 03/15/2018 1032   BILIRUBINUR NEGATIVE 03/15/2018 1032   BILIRUBINUR Negative 12/13/2012 0549   KETONESUR NEGATIVE 03/15/2018 1032   PROTEINUR 30 (A) 03/15/2018 1032   NITRITE NEGATIVE 03/15/2018 1032   LEUKOCYTESUR NEGATIVE 03/15/2018 1032   LEUKOCYTESUR Negative 12/13/2012 0549     Radiological Exams on Admission: CT Head Wo Contrast  Result Date: 09/13/2019 CLINICAL DATA:  Hit head on refrigerator. Redness across forehead significant swelling with severe pain and vomiting EXAM: CT HEAD WITHOUT CONTRAST CT MAXILLOFACIAL WITHOUT CONTRAST CT CERVICAL SPINE WITHOUT CONTRAST TECHNIQUE: Multidetector CT imaging of the head, cervical spine, and maxillofacial structures were performed using the standard protocol without intravenous contrast. Multiplanar CT image reconstructions of the cervical spine and maxillofacial structures were also generated. COMPARISON:  CT head from 03/15/2018 FINDINGS: CT HEAD FINDINGS Brain: No evidence of acute infarction, hemorrhage, hydrocephalus, extra-axial collection or mass lesion/mass effect. Signs of white matter disease and atrophy perhaps advanced for age as discussed on prior imaging studies. Vascular: No hyperdense vessel or unexpected calcification. Skull: Signs of facial fractures, see below. Fractures  of the nasal bone with mild displacement are noted. Other: Extensive soft tissue swelling overlying the bilateral orbits and frontal area tracking into the RIGHT and LEFT scalp overlying the temporalis musculature bilaterally. CT MAXILLOFACIAL FINDINGS Osseous: Bilateral nasal bone fractures with mild displacement. Zygomatic arches are intact. No signs of mastoid effusion. Pterygoid are intact. Sinuses are clear. No visible mandibular fracture. Orbits: Marked preseptal swelling. No retro bulbar stranding. Sinuses: Clear. Soft tissues: Extensive soft tissue swelling about the face tracking into the scalp along the RIGHT and LEFT scalp and along the RIGHT and LEFT face with extensive edema in the soft tissues of the scalp and face. Extends posteriorly bilaterally to approximately the parietal region. CT CERVICAL SPINE FINDINGS Alignment: Normal. Skull base and vertebrae: No acute fracture. No primary bone lesion or focal pathologic process. Soft tissues  and spinal canal: Stranding extending along the bilateral face along the angle of the mandible bilaterally and into the neck on the RIGHT. Disc levels: Mild degenerative changes throughout the spine greatest at C5-6. Upper chest: Negative. Other: None IMPRESSION: 1. No acute intracranial abnormality. Signs of white matter disease and atrophy perhaps advanced for age, correlate clinically with MRI follow-up on nonemergent basis as clinically warranted. 2. Bilateral nasal bone fractures with mild displacement. 3. Extensive soft tissue swelling about the face tracking into the scalp and face with extensive edema, also tracking into the RIGHT upper neck antro laterally. 4. Given the extensive nature of the edema would also correlate with any signs of cellulitis or infection. 5. No evidence of acute fracture or subluxation of the cervical spine. 6. Mild degenerative changes throughout the spine greatest at C5-6. Electronically Signed   By: Zetta Bills M.D.   On: 09/13/2019 13:53   CT Cervical Spine Wo Contrast  Result Date: 09/13/2019 CLINICAL DATA:  Hit head on refrigerator. Redness across forehead significant swelling with severe pain and vomiting EXAM: CT HEAD WITHOUT CONTRAST CT MAXILLOFACIAL WITHOUT CONTRAST CT CERVICAL SPINE WITHOUT CONTRAST TECHNIQUE: Multidetector CT imaging of the head, cervical spine, and maxillofacial structures were performed using the standard protocol without intravenous contrast. Multiplanar CT image reconstructions of the cervical spine and maxillofacial structures were also generated. COMPARISON:  CT head from 03/15/2018 FINDINGS: CT HEAD FINDINGS Brain: No evidence of acute infarction, hemorrhage, hydrocephalus, extra-axial collection or mass lesion/mass effect. Signs of white matter disease and atrophy perhaps advanced for age as discussed on prior imaging studies. Vascular: No hyperdense vessel or unexpected calcification. Skull: Signs of facial fractures, see below. Fractures  of the nasal bone with mild displacement are noted. Other: Extensive soft tissue swelling overlying the bilateral orbits and frontal area tracking into the RIGHT and LEFT scalp overlying the temporalis musculature bilaterally. CT MAXILLOFACIAL FINDINGS Osseous: Bilateral nasal bone fractures with mild displacement. Zygomatic arches are intact. No signs of mastoid effusion. Pterygoid are intact. Sinuses are clear. No visible mandibular fracture. Orbits: Marked preseptal swelling. No retro bulbar stranding. Sinuses: Clear. Soft tissues: Extensive soft tissue swelling about the face tracking into the scalp along the RIGHT and LEFT scalp and along the RIGHT and LEFT face with extensive edema in the soft tissues of the scalp and face. Extends posteriorly bilaterally to approximately the parietal region. CT CERVICAL SPINE FINDINGS Alignment: Normal. Skull base and vertebrae: No acute fracture. No primary bone lesion or focal pathologic process. Soft tissues and spinal canal: Stranding extending along the bilateral face along the angle of the mandible bilaterally and into the neck on the RIGHT. Disc levels: Mild  degenerative changes throughout the spine greatest at C5-6. Upper chest: Negative. Other: None IMPRESSION: 1. No acute intracranial abnormality. Signs of white matter disease and atrophy perhaps advanced for age, correlate clinically with MRI follow-up on nonemergent basis as clinically warranted. 2. Bilateral nasal bone fractures with mild displacement. 3. Extensive soft tissue swelling about the face tracking into the scalp and face with extensive edema, also tracking into the RIGHT upper neck antro laterally. 4. Given the extensive nature of the edema would also correlate with any signs of cellulitis or infection. 5. No evidence of acute fracture or subluxation of the cervical spine. 6. Mild degenerative changes throughout the spine greatest at C5-6. Electronically Signed   By: Donzetta Kohut M.D.   On:  09/13/2019 13:53   CT Maxillofacial Wo Contrast  Result Date: 09/13/2019 CLINICAL DATA:  Hit head on refrigerator. Redness across forehead significant swelling with severe pain and vomiting EXAM: CT HEAD WITHOUT CONTRAST CT MAXILLOFACIAL WITHOUT CONTRAST CT CERVICAL SPINE WITHOUT CONTRAST TECHNIQUE: Multidetector CT imaging of the head, cervical spine, and maxillofacial structures were performed using the standard protocol without intravenous contrast. Multiplanar CT image reconstructions of the cervical spine and maxillofacial structures were also generated. COMPARISON:  CT head from 03/15/2018 FINDINGS: CT HEAD FINDINGS Brain: No evidence of acute infarction, hemorrhage, hydrocephalus, extra-axial collection or mass lesion/mass effect. Signs of white matter disease and atrophy perhaps advanced for age as discussed on prior imaging studies. Vascular: No hyperdense vessel or unexpected calcification. Skull: Signs of facial fractures, see below. Fractures of the nasal bone with mild displacement are noted. Other: Extensive soft tissue swelling overlying the bilateral orbits and frontal area tracking into the RIGHT and LEFT scalp overlying the temporalis musculature bilaterally. CT MAXILLOFACIAL FINDINGS Osseous: Bilateral nasal bone fractures with mild displacement. Zygomatic arches are intact. No signs of mastoid effusion. Pterygoid are intact. Sinuses are clear. No visible mandibular fracture. Orbits: Marked preseptal swelling. No retro bulbar stranding. Sinuses: Clear. Soft tissues: Extensive soft tissue swelling about the face tracking into the scalp along the RIGHT and LEFT scalp and along the RIGHT and LEFT face with extensive edema in the soft tissues of the scalp and face. Extends posteriorly bilaterally to approximately the parietal region. CT CERVICAL SPINE FINDINGS Alignment: Normal. Skull base and vertebrae: No acute fracture. No primary bone lesion or focal pathologic process. Soft tissues and spinal  canal: Stranding extending along the bilateral face along the angle of the mandible bilaterally and into the neck on the RIGHT. Disc levels: Mild degenerative changes throughout the spine greatest at C5-6. Upper chest: Negative. Other: None IMPRESSION: 1. No acute intracranial abnormality. Signs of white matter disease and atrophy perhaps advanced for age, correlate clinically with MRI follow-up on nonemergent basis as clinically warranted. 2. Bilateral nasal bone fractures with mild displacement. 3. Extensive soft tissue swelling about the face tracking into the scalp and face with extensive edema, also tracking into the RIGHT upper neck antro laterally. 4. Given the extensive nature of the edema would also correlate with any signs of cellulitis or infection. 5. No evidence of acute fracture or subluxation of the cervical spine. 6. Mild degenerative changes throughout the spine greatest at C5-6. Electronically Signed   By: Donzetta Kohut M.D.   On: 09/13/2019 13:53    EKG: Independently reviewed.   Assessment/Plan Principal Problem:   Sepsis (HCC) Active Problems:   Facial cellulitis   AKI (acute kidney injury) (HCC)    Sepsis from facial cellulitis (POA) Patient presents with significant  facial swelling, redness extending to his neck. Will place patient empirically on IV antibiotic therapy with vancomycin and Zosyn per pharmacy dosing Pain control   Acute kidney injury Etiology unclear and may be related to ATN from sepsis as patient has relative hypotension Obtain total CK levels since patient's facial swelling may be related to trauma as well Aggressive IV fluid hydration Repeat renal parameters in a.m.   GERD Continue PPI   DVT prophylaxis: Lovenox Code Status: Full Family Communication: Greater than 50% of time was spent discussing patient's condition and plan of care with him and his father at the bedside all questions and concerns have been addressed.  All questions and  concerns Disposition Plan: Back to previous home environment Consults called: ENT    Jayan Raymundo MD [facility]     09/13/2019, 3:47 PM

## 2019-09-13 NOTE — ED Provider Notes (Signed)
[facility] provider Note       Time seen: ----------------------------------------- 12:26 PM on 09/13/2019 -----------------------------------------   I have reviewed the vital signs and the nursing notes.  HISTORY   Chief Complaint Head Injury    HPI Philip Owen is a 44 y.o. male with a history of anxiety, GERD who presents to the ER for a head injury.  Patient reports hitting his head on a refrigerator on Thursday night and then rolling off a couch hitting his forehead during the night last night.  He presents with massive forehead swelling and facial swelling.  Eyes are swollen shut.  He has been having severe headache with vomiting.  Past Medical History:  Diagnosis Date  . Acid reflux   . Anxiety   . GERD (gastroesophageal reflux disease)     Past Surgical History:  Procedure Laterality Date  . none      Allergies Tramadol and Naproxen   Review of Systems Constitutional: Negative for fever. Cardiovascular: Negative for chest pain. Respiratory: Negative for shortness of breath. Gastrointestinal: Negative for abdominal pain, positive for vomiting Musculoskeletal: Negative for back pain. Skin: Positive for head and facial swelling Neurological: Positive for headache  All systems negative/normal/unremarkable except as stated in the HPI  ____________________________________________   PHYSICAL EXAM:  VITAL SIGNS: ED Triage Vitals  Enc Vitals Group     BP 09/13/19 1220 104/64     Pulse Rate 09/13/19 1220 (!) 130     Resp 09/13/19 1220 20     Temp 09/13/19 1220 98 F (36.7 C)     Temp Source 09/13/19 1220 Oral     SpO2 09/13/19 1220 99 %     Weight 09/13/19 1219 165 lb (74.8 kg)     Height 09/13/19 1219 5\' 8"  (1.727 m)     Head Circumference --      Peak Flow --      Pain Score 09/13/19 1219 10     Pain Loc --      Pain Edu? --      Excl. in [place]? --    Constitutional: Alert and oriented.  Mild distress Eyes: Vision is normal,  eyelids are swollen shut, marked periorbital edema is noted ENT      Head: Head is grossly deformed and swollen essentially from mid face to forehead with diffuse edema and some ecchymosis.  There is a left frontal scalp laceration at the hairline with apparently some purulent drainage.      Nose: No congestion/rhinnorhea.      Mouth/Throat: Mucous membranes are moist.      Neck: No stridor. Cardiovascular: Normal rate, regular rhythm. No murmurs, rubs, or gallops. Respiratory: Normal respiratory effort without tachypnea nor retractions. Breath sounds are clear and equal bilaterally. No wheezes/rales/rhonchi. Gastrointestinal: Soft and nontender. Normal bowel sounds Musculoskeletal: Nontender with normal range of motion in extremities. No lower extremity tenderness nor edema. Neurologic:  Normal speech and language. No gross focal neurologic deficits are appreciated.  Skin: Marked facial swelling and ecchymosis as noted above Psychiatric: Speech and behavior are normal.  ____________________________________________   LABS (pertinent positives/negatives)  Labs Reviewed  CBC WITH DIFFERENTIAL/PLATELET - Abnormal; Notable for the following components:      Result Value   WBC 15.6 (*)    Neutro Abs 14.3 (*)    Lymphs Abs 0.2 (*)    Abs Immature Granulocytes 0.30 (*)    All other components within normal limits  COMPREHENSIVE METABOLIC PANEL - Abnormal; Notable for the following  components:   Sodium 130 (*)    Chloride 96 (*)    BUN 31 (*)    Creatinine, Ser 1.73 (*)    Calcium 8.4 (*)    Total Bilirubin 2.2 (*)    GFR calc non Af Amer 48 (*)    GFR calc Af Amer 56 (*)    All other components within normal limits  LACTIC ACID, PLASMA - Abnormal; Notable for the following components:   Lactic Acid, Venous 2.0 (*)    All other components within normal limits  CULTURE, BLOOD (ROUTINE X 2)  CULTURE, BLOOD (ROUTINE X 2)  AEROBIC/ANAEROBIC CULTURE (SURGICAL/DEEP WOUND)  LACTIC ACID,  PLASMA  ETHANOL  URINE DRUG SCREEN, QUALITATIVE ([facility] ONLY)   Radiology: CT head, C-spine, maxillofacial IMPRESSION: 1. No acute intracranial abnormality. Signs of white matter disease and atrophy perhaps advanced for age, correlate clinically with MRI follow-up on nonemergent basis as clinically warranted. 2. Bilateral nasal bone fractures with mild displacement. 3. Extensive soft tissue swelling about the face tracking into the scalp and face with extensive edema, also tracking into the RIGHT upper neck antro laterally. 4. Given the extensive nature of the edema would also correlate with any signs of cellulitis or infection. 5. No evidence of acute fracture or subluxation of the cervical spine. 6. Mild degenerative changes throughout the spine greatest at C5-6.   Differential diagnosis: Contusion, fracture, infection, abscess, subdural, subarachnoid, assault, cellulitis  ASSESSMENT AND PLAN  Head injury, facial cellulitis, possible sepsis   Plan: The patient had presented for head injury with massive facial and scalp swelling. Patient's labs surprisingly resembled more facial cellulitis or erysipelas.  Possibly some type of aggressive skin infection although no gas-forming bacteria were discovered.  On CT he has extensive soft tissue swelling on his face with bilateral nasal bone fractures.  He has been tachycardic with mildly low blood pressures.  Have ordered IV vancomycin and Zosyn for him.  I will discuss with the hospitalist for admission.  Daryel November MD    Note: This note was generated in part or whole with voice recognition software. Voice recognition is usually quite accurate but there are transcription errors that can and very often do occur. I apologize for any typographical errors that were not detected and corrected.     Emily Filbert, MD 09/13/19 [phone]

## 2019-09-13 NOTE — Consult Note (Addendum)
Name: Philip KindredBradley S Owen MRN: [id number] DOB: August 11, 1975    ADMISSION DATE:  09/13/2019 CONSULTATION DATE: 09/13/2019  REFERRING MD : Dr. Joylene IgoAgbata   CHIEF COMPLAINT: Head Injury   BRIEF PATIENT DESCRIPTION:  44 yo male admitted with sepsis secondary to facial cellulitis and bilateral nasal bone fractures with mild displacement s/p fall   SIGNIFICANT EVENTS/STUDIES:  05/14: Pt presented to [facility] ER with head injury and pain 05/14: CT Head/Cervical Spine/Maxillofacial revealed no acute intracranial abnormality.  Signs of white matter disease and atrophy perhaps advanced for age, correlate clinically with MRI follow-up on nonemergent basis as clinically warranted. Bilateral nasal bone fractures with mild displacement. Extensive soft tissue swelling about the face tracking into the scalp and face with extensive edema, also tracking into the RIGHT upper neck antro laterally. Given the extensive nature of the edema would also correlate with any signs of cellulitis or infection. No evidence of acute fracture or subluxation of the cervical spine. Mild degenerative changes throughout the spine greatest at C5-6 05/14: Pt admitted to the stepdown unit per hospitalist team PCCM consulted to assist with management.   HISTORY OF PRESENT ILLNESS:   This is a 44 yo male with a PMH of GERD, Anxiety, and Cocaine Abuse.  He presented to [facility] ER on 05/14 with a head injury.  Per ER notes the pt reported hitting his head on a refrigerator, and then rolled off a couch hitting his forehead/face on an amplifier the night of 05/13.  Upon arrival to the ER pt noted to have erythema across his forehead along with massive swelling of the frontotemporal area that extended down to the right neck along with bilateral periorbital swelling with both eyes swollen shut.  He also complained of severe head pain along with vomiting. Pt states he was not intoxicated at the time of injury. CT Maxillofacial revealed extensive soft tissue  swelling about the face tracking into the scalp and face with extensive edema, also tracking into the RIGHT upper neck antro laterally. ER lab results were: Na+ 130, chloride 96, BUN 31, creatinine 1.73, total bilirubin 2.2, lactic acid 2.0, wbc 15.6, urine drug +amphetamine/cocaine/cannabinoid, alcohol level <10, sbp 80-90's and hr 119-130's.  Pt received 2L NS bolus, 10 of iv decadron, 0.5 mg of dilaudid, zosyn, and vancomycin.  ENT consulted agreed with IV abx with staph coverage including MRSA; iv decadron, gentamycin to scalp laceration tid and clean with peroxide prn, and ice packs prn.  Pt admitted to the stepdown unit per hospitalist team and PCCM team consulted to assist with management.   PAST MEDICAL HISTORY :   has a past medical history of Acid reflux, Anxiety, and GERD (gastroesophageal reflux disease).  has a past surgical history that includes none. Prior to Admission medications   Medication Sig Start Date End Date Taking? Authorizing Provider  esomeprazole (NEXIUM 24HR) 20 MG capsule Take 20 mg by mouth daily at 12 noon.   Yes [provider]   Allergies  Allergen Reactions  . Tramadol Itching  . Naproxen     Itching     FAMILY HISTORY:  family history includes Brain cancer in his father. SOCIAL HISTORY:  reports that he has been smoking. He has a 10.00 pack-year smoking history. He has never used smokeless tobacco. He reports previous drug use. Drug: Cocaine. He reports that he does not drink alcohol.  REVIEW OF SYSTEMS: Positives in BOLD    Constitutional: Negative for fever, chills, weight loss, malaise/fatigue and diaphoresis.  HENT: facial swelling,  hearing loss, ear pain, nosebleeds, congestion, sore throat, bilateral neck pain, head laceration, tinnitus and ear discharge.   Eyes: bilateral eye swelling, blurred vision, double vision, photophobia, pain, discharge and redness.  Respiratory: Negative for cough, hemoptysis, sputum production, shortness of  breath, wheezing and stridor.   Cardiovascular: Negative for chest pain, palpitations, orthopnea, claudication, leg swelling and PND.  Gastrointestinal: Negative for heartburn, nausea, vomiting, abdominal pain, diarrhea, constipation, blood in stool and melena.  Genitourinary: Negative for dysuria, urgency, frequency, hematuria and flank pain.  Musculoskeletal: Negative for myalgias, back pain, joint pain and falls.  Skin: Negative for itching and rash.  Neurological: dizziness, tingling, tremors, sensory change, speech change, focal weakness, seizures, loss of consciousness, weakness and headaches.  Endo/Heme/Allergies: Negative for environmental allergies and polydipsia. Does not bruise/bleed easily.  SUBJECTIVE:  c/o headache and bilateral neck pain. States he can now open his eyes "which is better compared to when I first came in." Denies difficulty swallowing   VITAL SIGNS: Temp:  [98 F (36.7 C)-98.4 F (36.9 C)] 98.4 F (36.9 C) (05/14 2008) Pulse Rate:  [116-203] 118 (05/14 2008) Resp:  [10-22] 16 (05/14 2008) BP: (81-104)/(57-68) 91/60 (05/14 2008) SpO2:  [92 %-100 %] 99 % (05/14 2008) Weight:  [74.8 kg] 74.8 kg (05/14 1219)  PHYSICAL EXAMINATION: General: acutely ill appearing male, resting in bed, NAD  Neuro: alert and oriented follows commands, PERRLA HEENT: dentition within normal limits, bilateral neck tenderness with palpation but no crepitus present, edema of the head in the frontotemporal area that extends to the right neck and bilateral periorbital edema able to open both eyes Cardiovascular: sinus tach, no R/G  Lungs: clear throughout, even, non labored Abdomen: +BS x4, soft, non tender, non distended Musculoskeletal: normal bulk and tone, no edema  Skin: laceration to left midline scalp just inside the hairline with scant amount of yellow drainage and dried blood and right eye ecchymosis    Recent Labs  Lab 09/13/19 1328  NA 130*  K 4.0  CL 96*  CO2 22  BUN  31*  CREATININE 1.73*  GLUCOSE 94   Recent Labs  Lab 09/13/19 1328  HGB 14.2  HCT 40.7  WBC 15.6*  PLT 235   CT Head Wo Contrast  Result Date: 09/13/2019 CLINICAL DATA:  Hit head on refrigerator. Redness across forehead significant swelling with severe pain and vomiting EXAM: CT HEAD WITHOUT CONTRAST CT MAXILLOFACIAL WITHOUT CONTRAST CT CERVICAL SPINE WITHOUT CONTRAST TECHNIQUE: Multidetector CT imaging of the head, cervical spine, and maxillofacial structures were performed using the standard protocol without intravenous contrast. Multiplanar CT image reconstructions of the cervical spine and maxillofacial structures were also generated. COMPARISON:  CT head from 03/15/2018 FINDINGS: CT HEAD FINDINGS Brain: No evidence of acute infarction, hemorrhage, hydrocephalus, extra-axial collection or mass lesion/mass effect. Signs of white matter disease and atrophy perhaps advanced for age as discussed on prior imaging studies. Vascular: No hyperdense vessel or unexpected calcification. Skull: Signs of facial fractures, see below. Fractures of the nasal bone with mild displacement are noted. Other: Extensive soft tissue swelling overlying the bilateral orbits and frontal area tracking into the RIGHT and LEFT scalp overlying the temporalis musculature bilaterally. CT MAXILLOFACIAL FINDINGS Osseous: Bilateral nasal bone fractures with mild displacement. Zygomatic arches are intact. No signs of mastoid effusion. Pterygoid are intact. Sinuses are clear. No visible mandibular fracture. Orbits: Marked preseptal swelling. No retro bulbar stranding. Sinuses: Clear. Soft tissues: Extensive soft tissue swelling about the face tracking into the scalp along the RIGHT and  LEFT scalp and along the RIGHT and LEFT face with extensive edema in the soft tissues of the scalp and face. Extends posteriorly bilaterally to approximately the parietal region. CT CERVICAL SPINE FINDINGS Alignment: Normal. Skull base and vertebrae: No  acute fracture. No primary bone lesion or focal pathologic process. Soft tissues and spinal canal: Stranding extending along the bilateral face along the angle of the mandible bilaterally and into the neck on the RIGHT. Disc levels: Mild degenerative changes throughout the spine greatest at C5-6. Upper chest: Negative. Other: None IMPRESSION: 1. No acute intracranial abnormality. Signs of white matter disease and atrophy perhaps advanced for age, correlate clinically with MRI follow-up on nonemergent basis as clinically warranted. 2. Bilateral nasal bone fractures with mild displacement. 3. Extensive soft tissue swelling about the face tracking into the scalp and face with extensive edema, also tracking into the RIGHT upper neck antro laterally. 4. Given the extensive nature of the edema would also correlate with any signs of cellulitis or infection. 5. No evidence of acute fracture or subluxation of the cervical spine. 6. Mild degenerative changes throughout the spine greatest at C5-6. Electronically Signed   By: Zetta Bills M.D.   On: 09/13/2019 13:53   CT Cervical Spine Wo Contrast  Result Date: 09/13/2019 CLINICAL DATA:  Hit head on refrigerator. Redness across forehead significant swelling with severe pain and vomiting EXAM: CT HEAD WITHOUT CONTRAST CT MAXILLOFACIAL WITHOUT CONTRAST CT CERVICAL SPINE WITHOUT CONTRAST TECHNIQUE: Multidetector CT imaging of the head, cervical spine, and maxillofacial structures were performed using the standard protocol without intravenous contrast. Multiplanar CT image reconstructions of the cervical spine and maxillofacial structures were also generated. COMPARISON:  CT head from 03/15/2018 FINDINGS: CT HEAD FINDINGS Brain: No evidence of acute infarction, hemorrhage, hydrocephalus, extra-axial collection or mass lesion/mass effect. Signs of white matter disease and atrophy perhaps advanced for age as discussed on prior imaging studies. Vascular: No hyperdense vessel or  unexpected calcification. Skull: Signs of facial fractures, see below. Fractures of the nasal bone with mild displacement are noted. Other: Extensive soft tissue swelling overlying the bilateral orbits and frontal area tracking into the RIGHT and LEFT scalp overlying the temporalis musculature bilaterally. CT MAXILLOFACIAL FINDINGS Osseous: Bilateral nasal bone fractures with mild displacement. Zygomatic arches are intact. No signs of mastoid effusion. Pterygoid are intact. Sinuses are clear. No visible mandibular fracture. Orbits: Marked preseptal swelling. No retro bulbar stranding. Sinuses: Clear. Soft tissues: Extensive soft tissue swelling about the face tracking into the scalp along the RIGHT and LEFT scalp and along the RIGHT and LEFT face with extensive edema in the soft tissues of the scalp and face. Extends posteriorly bilaterally to approximately the parietal region. CT CERVICAL SPINE FINDINGS Alignment: Normal. Skull base and vertebrae: No acute fracture. No primary bone lesion or focal pathologic process. Soft tissues and spinal canal: Stranding extending along the bilateral face along the angle of the mandible bilaterally and into the neck on the RIGHT. Disc levels: Mild degenerative changes throughout the spine greatest at C5-6. Upper chest: Negative. Other: None IMPRESSION: 1. No acute intracranial abnormality. Signs of white matter disease and atrophy perhaps advanced for age, correlate clinically with MRI follow-up on nonemergent basis as clinically warranted. 2. Bilateral nasal bone fractures with mild displacement. 3. Extensive soft tissue swelling about the face tracking into the scalp and face with extensive edema, also tracking into the RIGHT upper neck antro laterally. 4. Given the extensive nature of the edema would also correlate with any signs of  cellulitis or infection. 5. No evidence of acute fracture or subluxation of the cervical spine. 6. Mild degenerative changes throughout the spine  greatest at C5-6. Electronically Signed   By: Donzetta Kohut M.D.   On: 09/13/2019 13:53   CT Maxillofacial Wo Contrast  Result Date: 09/13/2019 CLINICAL DATA:  Hit head on refrigerator. Redness across forehead significant swelling with severe pain and vomiting EXAM: CT HEAD WITHOUT CONTRAST CT MAXILLOFACIAL WITHOUT CONTRAST CT CERVICAL SPINE WITHOUT CONTRAST TECHNIQUE: Multidetector CT imaging of the head, cervical spine, and maxillofacial structures were performed using the standard protocol without intravenous contrast. Multiplanar CT image reconstructions of the cervical spine and maxillofacial structures were also generated. COMPARISON:  CT head from 03/15/2018 FINDINGS: CT HEAD FINDINGS Brain: No evidence of acute infarction, hemorrhage, hydrocephalus, extra-axial collection or mass lesion/mass effect. Signs of white matter disease and atrophy perhaps advanced for age as discussed on prior imaging studies. Vascular: No hyperdense vessel or unexpected calcification. Skull: Signs of facial fractures, see below. Fractures of the nasal bone with mild displacement are noted. Other: Extensive soft tissue swelling overlying the bilateral orbits and frontal area tracking into the RIGHT and LEFT scalp overlying the temporalis musculature bilaterally. CT MAXILLOFACIAL FINDINGS Osseous: Bilateral nasal bone fractures with mild displacement. Zygomatic arches are intact. No signs of mastoid effusion. Pterygoid are intact. Sinuses are clear. No visible mandibular fracture. Orbits: Marked preseptal swelling. No retro bulbar stranding. Sinuses: Clear. Soft tissues: Extensive soft tissue swelling about the face tracking into the scalp along the RIGHT and LEFT scalp and along the RIGHT and LEFT face with extensive edema in the soft tissues of the scalp and face. Extends posteriorly bilaterally to approximately the parietal region. CT CERVICAL SPINE FINDINGS Alignment: Normal. Skull base and vertebrae: No acute fracture. No  primary bone lesion or focal pathologic process. Soft tissues and spinal canal: Stranding extending along the bilateral face along the angle of the mandible bilaterally and into the neck on the RIGHT. Disc levels: Mild degenerative changes throughout the spine greatest at C5-6. Upper chest: Negative. Other: None IMPRESSION: 1. No acute intracranial abnormality. Signs of white matter disease and atrophy perhaps advanced for age, correlate clinically with MRI follow-up on nonemergent basis as clinically warranted. 2. Bilateral nasal bone fractures with mild displacement. 3. Extensive soft tissue swelling about the face tracking into the scalp and face with extensive edema, also tracking into the RIGHT upper neck antro laterally. 4. Given the extensive nature of the edema would also correlate with any signs of cellulitis or infection. 5. No evidence of acute fracture or subluxation of the cervical spine. 6. Mild degenerative changes throughout the spine greatest at C5-6. Electronically Signed   By: Donzetta Kohut M.D.   On: 09/13/2019 13:53    ASSESSMENT / PLAN:  Sepsis secondary to facial cellulitis Bilateral nasal bone fractures with mild displacement s/p fall  Acute renal failure due to sepsis  Hyponatremia  Mild lactic acidosis  Acute pain Polysubstance abuse-(urine drug screen positive: amphetamine/cocaine/cannabinoid) Continuous telemetry monitoring  Aggressive fluid resuscitation to maintain map >65 Trend WBC and monitor fever curve  Trend PCT  Continue cefepime, flagyl, and linezolid for now Follow cultures CK, C-reactive protein, and sedimentation rate results pending  ENT consulted appreciate input-continue topical gentamicin tid and scheduled decadron  Trend BMP and lactic acid  Replace electrolytes as indicated  Monitor UOP  Avoid nephrotoxic medications  Polysubstance abuse cessation counseling provided  Prn dilaudid for pain management  -Case discussed with ICU Intensivist Dr.  Kasa who agrees with plan of care as described above.   Sonda Rumble, AGNP  Pulmonary/Critical Care Pager [phone] (please enter 7 digits) PCCM Consult Pager [phone] (please enter 7 digits)

## 2019-09-13 NOTE — ED Notes (Signed)
Pt continues to be hypotensive. Hospitalist/ NP consulted at this time

## 2019-09-14 DIAGNOSIS — L03211 Cellulitis of face: Secondary | ICD-10-CM

## 2019-09-14 LAB — URINALYSIS, COMPLETE (UACMP) WITH MICROSCOPIC
Bacteria, UA: NONE SEEN
Bilirubin Urine: NEGATIVE
Glucose, UA: NEGATIVE mg/dL
Hgb urine dipstick: NEGATIVE
Ketones, ur: NEGATIVE mg/dL
Nitrite: NEGATIVE
Protein, ur: 30 mg/dL — AB
Specific Gravity, Urine: 1.031 — ABNORMAL HIGH (ref 1.005–1.030)
Squamous Epithelial / HPF: NONE SEEN (ref 0–5)
pH: 5 (ref 5.0–8.0)

## 2019-09-14 LAB — CBC
HCT: 31 % — ABNORMAL LOW (ref 39.0–52.0)
Hemoglobin: 11.1 g/dL — ABNORMAL LOW (ref 13.0–17.0)
MCH: 29.5 pg (ref 26.0–34.0)
MCHC: 35.8 g/dL (ref 30.0–36.0)
MCV: 82.4 fL (ref 80.0–100.0)
Platelets: 171 10*3/uL (ref 150–400)
RBC: 3.76 MIL/uL — ABNORMAL LOW (ref 4.22–5.81)
RDW: 12.2 % (ref 11.5–15.5)
WBC: 21 10*3/uL — ABNORMAL HIGH (ref 4.0–10.5)
nRBC: 0 % (ref 0.0–0.2)

## 2019-09-14 LAB — BASIC METABOLIC PANEL
Anion gap: 8 (ref 5–15)
BUN: 25 mg/dL — ABNORMAL HIGH (ref 6–20)
CO2: 21 mmol/L — ABNORMAL LOW (ref 22–32)
Calcium: 7.3 mg/dL — ABNORMAL LOW (ref 8.9–10.3)
Chloride: 104 mmol/L (ref 98–111)
Creatinine, Ser: 1.07 mg/dL (ref 0.61–1.24)
GFR calc Af Amer: >60 mL/min (ref >60)
GFR calc non Af Amer: >60 mL/min (ref >60)
Glucose, Bld: 91 mg/dL (ref 70–99)
Potassium: 4.4 mmol/L (ref 3.5–5.1)
Sodium: 133 mmol/L — ABNORMAL LOW (ref 135–145)

## 2019-09-14 LAB — PROTIME-INR
INR: 2.1 — ABNORMAL HIGH (ref 0.8–1.2)
Prothrombin Time: 22.5 seconds — ABNORMAL HIGH (ref 11.4–15.2)

## 2019-09-14 LAB — GLUCOSE, CAPILLARY: Glucose-Capillary: 101 mg/dL — ABNORMAL HIGH (ref 70–99)

## 2019-09-14 LAB — PROCALCITONIN: Procalcitonin: 16.89 ng/mL

## 2019-09-14 LAB — C-REACTIVE PROTEIN: CRP: 22.9 mg/dL — ABNORMAL HIGH (ref <1.0)

## 2019-09-14 LAB — LACTIC ACID, PLASMA
Lactic Acid, Venous: 2.4 mmol/L (ref 0.5–1.9)
Lactic Acid, Venous: 2.6 mmol/L (ref 0.5–1.9)
Lactic Acid, Venous: 2.5 mmol/L (ref 0.5–1.9)

## 2019-09-14 LAB — SEDIMENTATION RATE: Sed Rate: 16 mm/hr — ABNORMAL HIGH (ref 0–15)

## 2019-09-14 LAB — CORTISOL-AM, BLOOD: Cortisol - AM: 11.1 ug/dL (ref 6.7–22.6)

## 2019-09-14 LAB — CK: Total CK: 115 U/L (ref 49–397)

## 2019-09-14 LAB — HIV ANTIBODY (ROUTINE TESTING W REFLEX): HIV Screen 4th Generation wRfx: NONREACTIVE

## 2019-09-14 LAB — PHOSPHORUS: Phosphorus: 3.1 mg/dL (ref 2.5–4.6)

## 2019-09-14 LAB — MRSA PCR SCREENING: MRSA by PCR: NEGATIVE

## 2019-09-14 LAB — MAGNESIUM: Magnesium: 1.2 mg/dL — ABNORMAL LOW (ref 1.7–2.4)

## 2019-09-14 MED ORDER — SODIUM CHLORIDE 0.9 % IV SOLN
2.0000 g | Freq: Three times a day (TID) | INTRAVENOUS | Status: DC
  Administered 2019-09-14 – 2019-09-16 (×7): 2 g via INTRAVENOUS
  Filled 2019-09-14 (×11): qty 2

## 2019-09-14 MED ORDER — LACTATED RINGERS IV BOLUS
500.0000 mL | Freq: Once | INTRAVENOUS | Status: AC
  Administered 2019-09-14: 500 mL via INTRAVENOUS

## 2019-09-14 MED ORDER — CALCIUM CARBONATE ANTACID 500 MG PO CHEW
1.0000 | CHEWABLE_TABLET | Freq: Three times a day (TID) | ORAL | Status: DC | PRN
  Administered 2019-09-15: 200 mg via ORAL
  Filled 2019-09-14: qty 1

## 2019-09-14 MED ORDER — SODIUM CHLORIDE 0.9 % IV SOLN
INTRAVENOUS | Status: DC | PRN
  Administered 2019-09-15: 250 mL via INTRAVENOUS

## 2019-09-14 MED ORDER — GUAIFENESIN 100 MG/5ML PO SOLN
10.0000 mL | Freq: Four times a day (QID) | ORAL | Status: DC | PRN
  Filled 2019-09-14: qty 10

## 2019-09-14 MED ORDER — MORPHINE SULFATE (PF) 4 MG/ML IV SOLN
4.0000 mg | INTRAVENOUS | Status: DC | PRN
  Administered 2019-09-14 – 2019-09-16 (×4): 4 mg via INTRAVENOUS
  Filled 2019-09-14 (×4): qty 1

## 2019-09-14 MED ORDER — CHLORHEXIDINE GLUCONATE CLOTH 2 % EX PADS
6.0000 | MEDICATED_PAD | Freq: Every day | CUTANEOUS | Status: DC
  Administered 2019-09-14 – 2019-09-15 (×2): 6 via TOPICAL

## 2019-09-14 MED ORDER — HYDROCODONE-ACETAMINOPHEN 5-325 MG PO TABS
1.0000 | ORAL_TABLET | Freq: Four times a day (QID) | ORAL | Status: DC | PRN
  Administered 2019-09-14: 1 via ORAL
  Administered 2019-09-14 – 2019-09-15 (×5): 2 via ORAL
  Administered 2019-09-16: 1 via ORAL
  Administered 2019-09-16: 2 via ORAL
  Filled 2019-09-14: qty 2
  Filled 2019-09-14: qty 1
  Filled 2019-09-14 (×4): qty 2
  Filled 2019-09-14: qty 1
  Filled 2019-09-14: qty 2

## 2019-09-14 MED ORDER — TETANUS-DIPHTHERIA TOXOIDS TD 5-2 LFU IM INJ
0.5000 mL | INJECTION | Freq: Once | INTRAMUSCULAR | Status: DC
  Filled 2019-09-14: qty 0.5

## 2019-09-14 MED ORDER — MAGNESIUM SULFATE 2 GM/50ML IV SOLN
2.0000 g | Freq: Once | INTRAVENOUS | Status: AC
  Administered 2019-09-14: 2 g via INTRAVENOUS
  Filled 2019-09-14: qty 50

## 2019-09-14 MED ORDER — GUAIFENESIN-DM 100-10 MG/5ML PO SYRP: 10.0000 mL | ORAL_SOLUTION | Freq: Four times a day (QID) | ORAL | Status: DC | PRN

## 2019-09-14 MED ORDER — HYDROGEN PEROXIDE 3 % EX SOLN
Freq: Three times a day (TID) | CUTANEOUS | Status: DC
  Filled 2019-09-14: qty 473

## 2019-09-14 MED ORDER — ALUM & MAG HYDROXIDE-SIMETH 200-200-20 MG/5ML PO SUSP: 15.0000 mL | Freq: Four times a day (QID) | ORAL | Status: DC | PRN

## 2019-09-14 MED ORDER — DOCUSATE SODIUM 100 MG PO CAPS: 100.0000 mg | ORAL_CAPSULE | Freq: Two times a day (BID) | ORAL | Status: DC | PRN

## 2019-09-14 MED ORDER — HYDROMORPHONE HCL 1 MG/ML IJ SOLN
1.0000 mg | INTRAMUSCULAR | Status: DC | PRN
  Administered 2019-09-14: 1 mg via INTRAVENOUS
  Filled 2019-09-14: qty 1

## 2019-09-14 MED ORDER — PANTOPRAZOLE SODIUM 40 MG PO TBEC
40.0000 mg | DELAYED_RELEASE_TABLET | Freq: Every day | ORAL | Status: DC
  Administered 2019-09-14 – 2019-09-16 (×3): 40 mg via ORAL
  Filled 2019-09-14 (×3): qty 1

## 2019-09-14 MED ORDER — POLYETHYLENE GLYCOL 3350 17 G PO PACK: 17.0000 g | PACK | Freq: Two times a day (BID) | ORAL | Status: DC | PRN

## 2019-09-14 MED ORDER — MAGNESIUM SULFATE 2 GM/50ML IV SOLN
2.0000 g | INTRAVENOUS | Status: AC
  Administered 2019-09-14 (×2): 2 g via INTRAVENOUS
  Filled 2019-09-14 (×2): qty 50

## 2019-09-14 NOTE — ED Notes (Signed)
Lab called and asked to draw stat labs per NP [facility] request. Primary RN notified

## 2019-09-14 NOTE — Progress Notes (Signed)
Pharmacy Antibiotic Note  Philip Owen is a 44 y.o. male admitted on 09/13/2019 with sepsis secondary to facial cellulitis. Patient with past medical history significant for GERD and substance abuse. Patient received Zosyn and vancomycin x 1 in the ED and is being covered with cefepime and linezolid on admission. Patient is currently in AKI. Pharmacy has been consulted for cefepime dosing.  Plan: Cefepime 2g IV Q8hr. (renal fxn improved)  Linezolid 600mg  IV Q12hr.   Height: 5\' 8"  (172.7 cm) Weight: 66.8 kg (147 lb 4.3 oz) IBW/kg (Calculated) : 68.4  Temp (24hrs), Avg:98.2 F (36.8 C), Min:98 F (36.7 C), Max:98.4 F (36.9 C)  Recent Labs  Lab 09/13/19 1328 09/13/19 1536 09/14/19 0112 09/14/19 0342 09/14/19 0408  WBC 15.6*  --   --  21.0*  --   CREATININE 1.73*  --   --  1.07  --   LATICACIDVEN 2.0* 2.1* 2.4*  --  2.6*    Estimated Creatinine Clearance: 85.8 mL/min (by C-G formula based on SCr of 1.07 mg/dL).    Allergies  Allergen Reactions  . Tramadol Itching  . Naproxen     Itching     Antimicrobials this admission: Vancomycin 5/14 x 1 Zosyn 5/14 x 1 Cefepime 5/14 >> Linezolid 5/14 >>  Metronidazole 5/14 >>   Dose adjustments this admission: N/A  Microbiology results: 5/14 BCx: pending 5/14 Wound Culture: pending   Thank you for allowing pharmacy to be a part of this patient's care.  6/14 A 09/14/2019 5:54 AM

## 2019-09-14 NOTE — Progress Notes (Signed)
Vital signs reviewed, ICU needs resolved  Will sign off at this time. No further recommendations at this time.  Please call [phone] for further questions. Thank you.  Patient slowly improving, can transfer to gen med floor  Jazelyn Sipe Santiago Glad, M.D.  [facility]  Medical Director [facility] Medical Director [facility] Cardio-Pulmonary Department

## 2019-09-14 NOTE — Plan of Care (Signed)
Monitor Cellulitis qshift and PRN.

## 2019-09-14 NOTE — Consult Note (Signed)
Philip Owen, Philip Owen [id number] Feb 17, 1976 Sandi Mealy, MD   SUBJECTIVE: This 44 y.o. year old male followed for facial cellulitis after a fall and scalp laceration. Better today and able to open eyes. Still tender in face a neck.  Medications:  Current Facility-Administered Medications  Medication Dose Route Frequency Provider Last Rate Last Admin  . ceFEPIme (MAXIPIME) 2 g in sodium chloride 0.9 % 100 mL IVPB  2 g Intravenous Q8H Hall, Scott A, RPH 200 mL/hr at 09/14/19 1337 2 g at 09/14/19 1337  . Chlorhexidine Gluconate Cloth 2 % PADS 6 each  6 each Topical Daily Eugenie Norrie, NP   6 each at 09/14/19 0915  . dexamethasone (DECADRON) injection 4 mg  4 mg Intravenous Q6H Agbata, Tochukwu, MD   4 mg at 09/14/19 1157  . enoxaparin (LOVENOX) injection 40 mg  40 mg Subcutaneous Q24H Agbata, Tochukwu, MD   40 mg at 09/13/19 1952  . gentamicin ointment (GARAMYCIN) 0.1 %   Topical TID Geanie Logan, MD   Given at 09/14/19 1008  . HYDROcodone-acetaminophen (NORCO/VICODIN) 5-325 MG per tablet 1-2 tablet  1-2 tablet Oral Q6H PRN Eugenie Norrie, NP   2 tablet at 09/14/19 1159  . linezolid (ZYVOX) IVPB 600 mg  600 mg Intravenous Q12H Lucile Shutters, MD   Stopped at 09/14/19 1108  . metroNIDAZOLE (FLAGYL) IVPB 500 mg  500 mg Intravenous Q8H Agbata, Tochukwu, MD   Stopped at 09/14/19 0925  . morphine 4 MG/ML injection 4 mg  4 mg Intravenous Q4H PRN Darlin Priestly, MD      . ondansetron [facility]) tablet 4 mg  4 mg Oral Q6H PRN Agbata, Tochukwu, MD       Or  . ondansetron (ZOFRAN) injection 4 mg  4 mg Intravenous Q6H PRN Agbata, Tochukwu, MD      . pantoprazole (PROTONIX) EC tablet 40 mg  40 mg Oral Daily Erin Fulling, MD   40 mg at 09/14/19 1350  .  Medications Prior to Admission  Medication Sig Dispense Refill  . esomeprazole (NEXIUM 24HR) 20 MG capsule Take 20 mg by mouth daily at 12 noon.      OBJECTIVE:  PHYSICAL EXAM  Vitals: Blood pressure 125/79, pulse 91, temperature 98.8 F (37.1 C),  temperature source Oral, resp. rate 13, height  (1.727 m), weight 66.8 kg, SpO2 98 %.. General: Well-developed, Well-nourished in no acute distress Mood: Mood and affect well adjusted, pleasant and cooperative. Orientation: Grossly alert and oriented. Vocal Quality: No hoarseness. Communicates verbally. head and Face: Still noted swelling of the forehead and temples without crepitance. No purulent drainage from the scalp wound. Compared to yesterday, edema is notably better, particularly around the eyes. He could not open the eyes yesterday due to preseptal cellulitis. Nose: External nose normal with midline dorsum and no lesions, no significant deformity from fracture. Neck: Tender in right upper neck with mild edema, but no crepitance. The trachea is midline. Thyroid gland is soft, nontender and symmetric with no masses or enlargement. Parotid and submandibular glands are soft, nontender and symmetric, without masses. Lymphatic: Cervical lymph nodes are without palpable lymphadenopathy Respiratory: Normal respiratory effort without labored breathing.  MEDICAL DECISION MAKING: Data Review:  Results for orders placed or performed during the hospital encounter of 09/13/19 (from the past 48 hour(s))  CBC with Differential     Status: Abnormal   Collection Time: 09/13/19  1:28 PM  Result Value Ref Range   WBC 15.6 (H) 4.0 - 10.5 K/uL   RBC  4.81 4.22 - 5.81 MIL/uL   Hemoglobin 14.2 13.0 - 17.0 g/dL   HCT 16.1 09.6 - 04.5 %   MCV 84.6 80.0 - 100.0 fL   MCH 29.5 26.0 - 34.0 pg   MCHC 34.9 30.0 - 36.0 g/dL   RDW 40.9 81.1 - 91.4 %   Platelets 235 150 - 400 K/uL   nRBC 0.0 0.0 - 0.2 %   Neutrophils Relative % 92 %   Neutro Abs 14.3 (H) 1.7 - 7.7 K/uL   Lymphocytes Relative 1 %   Lymphs Abs 0.2 (L) 0.7 - 4.0 K/uL   Monocytes Relative 4 %   Monocytes Absolute 0.6 0.1 - 1.0 K/uL   Eosinophils Relative 0 %   Eosinophils Absolute 0.0 0.0 - 0.5 K/uL   Basophils Relative 1 %   Basophils  Absolute 0.1 0.0 - 0.1 K/uL   Immature Granulocytes 2 %   Abs Immature Granulocytes 0.30 (H) 0.00 - 0.07 K/uL    Comment: Performed at [facility], [address] [place], [place] [address]  Comprehensive metabolic panel     Status: Abnormal   Collection Time: 09/13/19  1:28 PM  Result Value Ref Range   Sodium 130 (L) 135 - 145 mmol/L   Potassium 4.0 3.5 - 5.1 mmol/L   Chloride 96 (L) 98 - 111 mmol/L   CO2 22 22 - 32 mmol/L   Glucose, Bld 94 70 - 99 mg/dL    Comment: Glucose reference range applies only to samples taken after fasting for at least 8 hours.   BUN 31 (H) 6 - 20 mg/dL   Creatinine, Ser 6.21 (H) 0.61 - 1.24 mg/dL   Calcium 8.4 (L) 8.9 - 10.3 mg/dL   Total Protein 6.5 6.5 - 8.1 g/dL   Albumin 3.7 3.5 - 5.0 g/dL   AST 26 15 - 41 U/L   ALT 26 0 - 44 U/L   Alkaline Phosphatase 48 38 - 126 U/L   Total Bilirubin 2.2 (H) 0.3 - 1.2 mg/dL   GFR calc non Af Amer 48 (L) >60 mL/min   GFR calc Af Amer 56 (L) >60 mL/min   Anion gap 12 5 - 15    Comment: Performed at [facility], [address] [place], [place] [address]  Blood culture (routine x 2)     Status: None (Preliminary result)   Collection Time: 09/13/19  1:28 PM   Specimen: BLOOD  Result Value Ref Range   Specimen Description BLOOD L WRIST    Special Requests      BOTTLES DRAWN AEROBIC AND ANAEROBIC Blood Culture results may not be optimal due to an excessive volume of blood received in culture bottles   Culture      NO GROWTH < 24 HOURS Performed at [facility], [address] [place], [place] [address]    Report Status PENDING   Blood culture (routine x 2)     Status: None (Preliminary result)   Collection Time: 09/13/19  1:28 PM   Specimen: BLOOD  Result Value Ref Range   Specimen Description BLOOD L FA    Special Requests      BOTTLES DRAWN AEROBIC AND ANAEROBIC Blood Culture adequate volume   Culture      NO GROWTH < 24 HOURS Performed at [facility], [address], [place], [place] [address]    Report Status PENDING   Lactic acid, plasma     Status: Abnormal   Collection Time: 09/13/19  1:28 PM  Result Value Ref Range   Lactic Acid, Venous 2.0 (HH) 0.5 - 1.9 mmol/L    Comment: CRITICAL RESULT CALLED TO, READ BACK BY AND VERIFIED WITH JENNIFER WHITLEY 1412 09/13/2019 DB Performed at [facility], [address] [place], [place] [address]   Ethanol     Status: None   Collection Time: 09/13/19  1:28 PM  Result Value Ref Range   Alcohol, Ethyl (B) <10 <10 mg/dL    Comment: (NOTE) Lowest detectable limit for serum alcohol is 10 mg/dL. For medical purposes only. Performed at [facility], [address] [place], [place] [address]   Aerobic/Anaerobic Culture (surgical/deep wound)     Status: None (Preliminary result)   Collection Time: 09/13/19  3:06 PM   Specimen: Wound  Result Value Ref Range   Specimen Description      WOUND Performed at [facility], [address], [place], [place] [address]    Special Requests      NONE Performed at [facility], [address] [place], [place] [address]    Gram Stain      MODERATE WBC PRESENT,BOTH PMN AND MONONUCLEAR MODERATE GRAM POSITIVE COCCI IN CHAINS IN PAIRS    Culture      TOO YOUNG TO READ Performed at [facility], [address] [place], [place] [address]    Report Status PENDING   Lactic acid, plasma     Status: Abnormal   Collection Time: 09/13/19  3:36 PM  Result Value Ref Range   Lactic Acid, Venous 2.1 (HH) 0.5 - 1.9 mmol/L    Comment: CRITICAL RESULT CALLED TO, READ BACK BY AND VERIFIED WITH JENNIFER WHITLEY  09/13/19 MJU Performed at [facility], [address], [place], [place] [address]   Urine Drug Screen, Qualitative ([facility] only)     Status: Abnormal   Collection Time: 09/13/19  3:36 PM  Result Value Ref Range   Tricyclic, Ur Screen NONE DETECTED NONE DETECTED   Amphetamines, Ur Screen POSITIVE  (A) NONE DETECTED   MDMA (Ecstasy)Ur Screen NONE DETECTED NONE DETECTED   Cocaine Metabolite,Ur [place] POSITIVE (A) NONE DETECTED   Opiate, Ur Screen NONE DETECTED NONE DETECTED   Phencyclidine (PCP) Ur S NONE DETECTED NONE DETECTED   Cannabinoid 50 Ng, Ur [place] POSITIVE (A) NONE DETECTED   Barbiturates, Ur Screen NONE DETECTED NONE DETECTED   Benzodiazepine, Ur Scrn NONE DETECTED NONE DETECTED   Methadone Scn, Ur NONE DETECTED NONE DETECTED    Comment: (NOTE) Tricyclics + metabolites, urine    Cutoff 1000 ng/mL Amphetamines + metabolites, urine  Cutoff 1000 ng/mL MDMA (Ecstasy), urine              Cutoff 500 ng/mL Cocaine Metabolite, urine          Cutoff 300 ng/mL Opiate + metabolites, urine        Cutoff 300 ng/mL Phencyclidine (PCP), urine         Cutoff 25 ng/mL Cannabinoid, urine                 Cutoff 50 ng/mL Barbiturates + metabolites, urine  Cutoff 200 ng/mL Benzodiazepine, urine              Cutoff 200 ng/mL Methadone, urine                   Cutoff 300 ng/mL The urine drug screen provides only a preliminary, unconfirmed analytical test result and should not be used  for non-medical purposes. Clinical consideration and professional judgment should be applied to any positive drug screen result due to possible interfering substances. A more specific alternate chemical method must be used in order to obtain a confirmed analytical result. Gas chromatography / mass spectrometry (GC/MS) is the preferred confirmat ory method. Performed at [facility], [address] [place], [place] [address]   Urinalysis, Complete w Microscopic     Status: Abnormal   Collection Time: 09/13/19  3:36 PM  Result Value Ref Range   Color, Urine AMBER (A) YELLOW    Comment: BIOCHEMICALS MAY BE AFFECTED BY COLOR   APPearance CLOUDY (A) CLEAR   Specific Gravity, Urine 1.031 (H) 1.005 - 1.030   pH 5.0 5.0 - 8.0   Glucose, UA NEGATIVE NEGATIVE mg/dL   Hgb urine dipstick NEGATIVE NEGATIVE    Bilirubin Urine NEGATIVE NEGATIVE   Ketones, ur NEGATIVE NEGATIVE mg/dL   Protein, ur 30 (A) NEGATIVE mg/dL   Nitrite NEGATIVE NEGATIVE   Leukocytes,Ua TRACE (A) NEGATIVE   RBC / HPF 0-5 0 - 5 RBC/hpf   WBC, UA 11-20 0 - 5 WBC/hpf   Bacteria, UA NONE SEEN NONE SEEN   Squamous Epithelial / LPF NONE SEEN 0 - 5   Mucus PRESENT    Hyaline Casts, UA PRESENT     Comment: Performed at [facility], [address] [place], [place] [address]  SARS Coronavirus 2 by RT PCR (hospital order, performed in [facility] hospital lab) Nasopharyngeal Nasopharyngeal Swab     Status: None   Collection Time: 09/13/19  8:21 PM   Specimen: Nasopharyngeal Swab  Result Value Ref Range   SARS Coronavirus 2 NEGATIVE NEGATIVE    Comment: (NOTE) SARS-CoV-2 target nucleic acids are NOT DETECTED. The SARS-CoV-2 RNA is generally detectable in upper and lower respiratory specimens during the acute phase of infection. The lowest concentration of SARS-CoV-2 viral copies this assay can detect is 250 copies / mL. A negative result does not preclude SARS-CoV-2 infection and should not be used as the sole basis for treatment or other patient management decisions.  A negative result may occur with improper specimen collection / handling, submission of specimen other than nasopharyngeal swab, presence of viral mutation(s) within the areas targeted by this assay, and inadequate number of viral copies (<250 copies / mL). A negative result must be combined with clinical observations, patient history, and epidemiological information. Fact Sheet for Patients:   BoilerBrush.com.cy Fact Sheet for Healthcare Providers: https://pope.com/ This test is not yet approved or cleared  by the [place] FDA and has been authorized for detection and/or diagnosis of SARS-CoV-2 by FDA under an Emergency Use Authorization (EUA).  This EUA will remain in effect (meaning this test  can be used) for the duration of the COVID-19 declaration under Section 564(b)(1) of the Act, 21 U.S.C. section 360bbb-3(b)(1), unless the authorization is terminated or revoked sooner. Performed at [facility], [address] [place], [place] [address]   CK     Status: None   Collection Time: 09/14/19  1:12 AM  Result Value Ref Range   Total CK 115 49 - 397 U/L    Comment: Performed at [facility], [address] [place], [place] [address]  Sedimentation rate     Status: Abnormal   Collection Time: 09/14/19  1:12 AM  Result Value Ref Range   Sed Rate 16 (H) 0 - 15 mm/hr    Comment: Performed at [facility], [address] [place], [place] [address]  C-reactive protein     Status: Abnormal   Collection Time: 09/14/19  1:12 AM  Result Value Ref Range   CRP 22.9 (H) <1.0 mg/dL    Comment: Performed at [facility], [address] [place], [place] [address]  Lactic acid, plasma     Status: Abnormal   Collection Time: 09/14/19  1:12 AM  Result Value Ref Range   Lactic Acid, Venous 2.4 (HH) 0.5 - 1.9 mmol/L    Comment: CRITICAL RESULT CALLED TO, READ BACK BY AND VERIFIED WITH NOAH GRIFFIN @218  09/14/2019 TTG Performed at [facility], [address] [place], [place] [address]   Glucose, capillary     Status: Abnormal   Collection Time: 09/14/19  2:54 AM  Result Value Ref Range   Glucose-Capillary 101 (H) 70 - 99 mg/dL    Comment: Glucose reference range applies only to samples taken after fasting for at least 8 hours.  MRSA PCR Screening     Status: None   Collection Time: 09/14/19  2:57 AM   Specimen: Nasopharyngeal  Result Value Ref Range   MRSA by PCR NEGATIVE NEGATIVE    Comment:        The GeneXpert MRSA Assay (FDA approved for NASAL specimens only), is one component of a comprehensive MRSA colonization surveillance program. It is not intended to diagnose MRSA infection nor to guide or monitor treatment for MRSA  infections. Performed at [facility], [address] [place], [place] [address]   Protime-INR     Status: Abnormal   Collection Time: 09/14/19  3:42 AM  Result Value Ref Range   Prothrombin Time 22.5 (H) 11.4 - 15.2 seconds   INR 2.1 (H) 0.8 - 1.2    Comment: (NOTE) INR goal varies based on device and disease states. Performed at [facility], [address] [place], [place] [address]   Procalcitonin     Status: None   Collection Time: 09/14/19  3:42 AM  Result Value Ref Range   Procalcitonin 16.89 ng/mL    Comment:        Interpretation: PCT >= 10 ng/mL: Important systemic inflammatory response, almost exclusively due to severe bacterial sepsis or septic shock. (NOTE)       Sepsis PCT Algorithm           Lower Respiratory Tract                                      Infection PCT Algorithm    ----------------------------     ----------------------------         PCT < 0.25 ng/mL                PCT < 0.10 ng/mL         Strongly encourage             Strongly discourage   discontinuation of antibiotics    initiation of antibiotics    ----------------------------     -----------------------------       PCT 0.25 - 0.50 ng/mL            PCT 0.10 - 0.25 ng/mL               OR       >80% decrease in PCT            Discourage initiation of  antibiotics      Encourage discontinuation           of antibiotics    ----------------------------     -----------------------------         PCT >= 0.50 ng/mL              PCT 0.26 - 0.50 ng/mL                AND       <80% decrease in PCT             Encourage initiation of                                             antibiotics       Encourage continuation           of antibiotics    ----------------------------     -----------------------------        PCT >= 0.50 ng/mL                  PCT > 0.50 ng/mL               AND         increase in PCT                  Strongly  encourage                                      initiation of antibiotics    Strongly encourage escalation           of antibiotics                                     -----------------------------                                           PCT <= 0.25 ng/mL                                                 OR                                        > 80% decrease in PCT                                     Discontinue / Do not initiate                                             antibiotics Performed at [facility], [address] [place], [place] [id number]   Basic metabolic panel     Status: Abnormal   Collection Time: 09/14/19  3:42 AM  Result Value Ref Range   Sodium 133 (L) 135 - 145 mmol/L   Potassium 4.4 3.5 - 5.1 mmol/L   Chloride 104 98 - 111 mmol/L   CO2 21 (L) 22 - 32 mmol/L   Glucose, Bld 91 70 - 99 mg/dL    Comment: Glucose reference range applies only to samples taken after fasting for at least 8 hours.   BUN 25 (H) 6 - 20 mg/dL   Creatinine, Ser 5.36 0.61 - 1.24 mg/dL   Calcium 7.3 (L) 8.9 - 10.3 mg/dL   GFR calc non Af Amer >60 >60 mL/min   GFR calc Af Amer >60 >60 mL/min   Anion gap 8 5 - 15    Comment: Performed at [facility], [address] [place], [place] [address]  CBC     Status: Abnormal   Collection Time: 09/14/19  3:42 AM  Result Value Ref Range   WBC 21.0 (H) 4.0 - 10.5 K/uL   RBC 3.76 (L) 4.22 - 5.81 MIL/uL   Hemoglobin 11.1 (L) 13.0 - 17.0 g/dL   HCT 47.4 (L) 25.9 - 56.3 %   MCV 82.4 80.0 - 100.0 fL   MCH 29.5 26.0 - 34.0 pg   MCHC 35.8 30.0 - 36.0 g/dL   RDW 87.5 64.3 - 32.9 %   Platelets 171 150 - 400 K/uL   nRBC 0.0 0.0 - 0.2 %    Comment: Performed at [facility], [address] [place], [place] [address]  Magnesium     Status: Abnormal   Collection Time: 09/14/19  3:42 AM  Result Value Ref Range   Magnesium 1.2 (L) 1.7 - 2.4 mg/dL    Comment: Performed at [facility], [address],  [place], [place] [address]  Phosphorus     Status: None   Collection Time: 09/14/19  3:42 AM  Result Value Ref Range   Phosphorus 3.1 2.5 - 4.6 mg/dL    Comment: Performed at [facility], [address] [place], [place] [address]  Cortisol-am, blood     Status: None   Collection Time: 09/14/19  4:08 AM  Result Value Ref Range   Cortisol - AM 11.1 6.7 - 22.6 ug/dL    Comment: Performed at [facility], [address], [place], [place] [address]  Lactic acid, plasma     Status: Abnormal   Collection Time: 09/14/19  4:08 AM  Result Value Ref Range   Lactic Acid, Venous 2.6 (HH) 0.5 - 1.9 mmol/L    Comment: CRITICAL VALUE NOTED. VALUE IS CONSISTENT WITH PREVIOUSLY REPORTED/CALLED VALUE HS Performed at [facility], [address] [place], [place] [address]   Lactic acid, plasma     Status: Abnormal   Collection Time: 09/14/19  8:47 AM  Result Value Ref Range   Lactic Acid, Venous 2.5 (HH) 0.5 - 1.9 mmol/L    Comment: CRITICAL VALUE NOTED. VALUE IS CONSISTENT WITH PREVIOUSLY REPORTED/CALLED VALUE / JAG Performed at [facility], [address] [place], [place] [address]   . CT Head Wo Contrast  Result Date: 09/13/2019 CLINICAL DATA:  Hit head on refrigerator. Redness across forehead significant swelling with severe pain and vomiting EXAM: CT HEAD WITHOUT CONTRAST CT MAXILLOFACIAL WITHOUT CONTRAST CT CERVICAL SPINE WITHOUT CONTRAST TECHNIQUE: Multidetector CT imaging of the head, cervical spine, and maxillofacial structures were performed using the standard protocol without intravenous contrast. Multiplanar CT image reconstructions of the cervical spine and maxillofacial structures were  also generated. COMPARISON:  CT head from 03/15/2018 FINDINGS: CT HEAD FINDINGS Brain: No evidence of acute infarction, hemorrhage, hydrocephalus, extra-axial collection or mass lesion/mass effect. Signs of white matter disease and atrophy perhaps advanced for age as discussed on  prior imaging studies. Vascular: No hyperdense vessel or unexpected calcification. Skull: Signs of facial fractures, see below. Fractures of the nasal bone with mild displacement are noted. Other: Extensive soft tissue swelling overlying the bilateral orbits and frontal area tracking into the RIGHT and LEFT scalp overlying the temporalis musculature bilaterally. CT MAXILLOFACIAL FINDINGS Osseous: Bilateral nasal bone fractures with mild displacement. Zygomatic arches are intact. No signs of mastoid effusion. Pterygoid are intact. Sinuses are clear. No visible mandibular fracture. Orbits: Marked preseptal swelling. No retro bulbar stranding. Sinuses: Clear. Soft tissues: Extensive soft tissue swelling about the face tracking into the scalp along the RIGHT and LEFT scalp and along the RIGHT and LEFT face with extensive edema in the soft tissues of the scalp and face. Extends posteriorly bilaterally to approximately the parietal region. CT CERVICAL SPINE FINDINGS Alignment: Normal. Skull base and vertebrae: No acute fracture. No primary bone lesion or focal pathologic process. Soft tissues and spinal canal: Stranding extending along the bilateral face along the angle of the mandible bilaterally and into the neck on the RIGHT. Disc levels: Mild degenerative changes throughout the spine greatest at C5-6. Upper chest: Negative. Other: None IMPRESSION: 1. No acute intracranial abnormality. Signs of white matter disease and atrophy perhaps advanced for age, correlate clinically with MRI follow-up on nonemergent basis as clinically warranted. 2. Bilateral nasal bone fractures with mild displacement. 3. Extensive soft tissue swelling about the face tracking into the scalp and face with extensive edema, also tracking into the RIGHT upper neck antro laterally. 4. Given the extensive nature of the edema would also correlate with any signs of cellulitis or infection. 5. No evidence of acute fracture or subluxation of the cervical  spine. 6. Mild degenerative changes throughout the spine greatest at C5-6. Electronically Signed   By: Zetta Bills M.D.   On: 09/13/2019 13:53   CT Cervical Spine Wo Contrast  Result Date: 09/13/2019 CLINICAL DATA:  Hit head on refrigerator. Redness across forehead significant swelling with severe pain and vomiting EXAM: CT HEAD WITHOUT CONTRAST CT MAXILLOFACIAL WITHOUT CONTRAST CT CERVICAL SPINE WITHOUT CONTRAST TECHNIQUE: Multidetector CT imaging of the head, cervical spine, and maxillofacial structures were performed using the standard protocol without intravenous contrast. Multiplanar CT image reconstructions of the cervical spine and maxillofacial structures were also generated. COMPARISON:  CT head from 03/15/2018 FINDINGS: CT HEAD FINDINGS Brain: No evidence of acute infarction, hemorrhage, hydrocephalus, extra-axial collection or mass lesion/mass effect. Signs of white matter disease and atrophy perhaps advanced for age as discussed on prior imaging studies. Vascular: No hyperdense vessel or unexpected calcification. Skull: Signs of facial fractures, see below. Fractures of the nasal bone with mild displacement are noted. Other: Extensive soft tissue swelling overlying the bilateral orbits and frontal area tracking into the RIGHT and LEFT scalp overlying the temporalis musculature bilaterally. CT MAXILLOFACIAL FINDINGS Osseous: Bilateral nasal bone fractures with mild displacement. Zygomatic arches are intact. No signs of mastoid effusion. Pterygoid are intact. Sinuses are clear. No visible mandibular fracture. Orbits: Marked preseptal swelling. No retro bulbar stranding. Sinuses: Clear. Soft tissues: Extensive soft tissue swelling about the face tracking into the scalp along the RIGHT and LEFT scalp and along the RIGHT and LEFT face with extensive edema in the soft tissues of the scalp and  face. Extends posteriorly bilaterally to approximately the parietal region. CT CERVICAL SPINE FINDINGS  Alignment: Normal. Skull base and vertebrae: No acute fracture. No primary bone lesion or focal pathologic process. Soft tissues and spinal canal: Stranding extending along the bilateral face along the angle of the mandible bilaterally and into the neck on the RIGHT. Disc levels: Mild degenerative changes throughout the spine greatest at C5-6. Upper chest: Negative. Other: None IMPRESSION: 1. No acute intracranial abnormality. Signs of white matter disease and atrophy perhaps advanced for age, correlate clinically with MRI follow-up on nonemergent basis as clinically warranted. 2. Bilateral nasal bone fractures with mild displacement. 3. Extensive soft tissue swelling about the face tracking into the scalp and face with extensive edema, also tracking into the RIGHT upper neck antro laterally. 4. Given the extensive nature of the edema would also correlate with any signs of cellulitis or infection. 5. No evidence of acute fracture or subluxation of the cervical spine. 6. Mild degenerative changes throughout the spine greatest at C5-6. Electronically Signed   By: Donzetta Kohut M.D.   On: 09/13/2019 13:53   CT Maxillofacial Wo Contrast  Result Date: 09/13/2019 CLINICAL DATA:  Hit head on refrigerator. Redness across forehead significant swelling with severe pain and vomiting EXAM: CT HEAD WITHOUT CONTRAST CT MAXILLOFACIAL WITHOUT CONTRAST CT CERVICAL SPINE WITHOUT CONTRAST TECHNIQUE: Multidetector CT imaging of the head, cervical spine, and maxillofacial structures were performed using the standard protocol without intravenous contrast. Multiplanar CT image reconstructions of the cervical spine and maxillofacial structures were also generated. COMPARISON:  CT head from 03/15/2018 FINDINGS: CT HEAD FINDINGS Brain: No evidence of acute infarction, hemorrhage, hydrocephalus, extra-axial collection or mass lesion/mass effect. Signs of white matter disease and atrophy perhaps advanced for age as discussed on prior  imaging studies. Vascular: No hyperdense vessel or unexpected calcification. Skull: Signs of facial fractures, see below. Fractures of the nasal bone with mild displacement are noted. Other: Extensive soft tissue swelling overlying the bilateral orbits and frontal area tracking into the RIGHT and LEFT scalp overlying the temporalis musculature bilaterally. CT MAXILLOFACIAL FINDINGS Osseous: Bilateral nasal bone fractures with mild displacement. Zygomatic arches are intact. No signs of mastoid effusion. Pterygoid are intact. Sinuses are clear. No visible mandibular fracture. Orbits: Marked preseptal swelling. No retro bulbar stranding. Sinuses: Clear. Soft tissues: Extensive soft tissue swelling about the face tracking into the scalp along the RIGHT and LEFT scalp and along the RIGHT and LEFT face with extensive edema in the soft tissues of the scalp and face. Extends posteriorly bilaterally to approximately the parietal region. CT CERVICAL SPINE FINDINGS Alignment: Normal. Skull base and vertebrae: No acute fracture. No primary bone lesion or focal pathologic process. Soft tissues and spinal canal: Stranding extending along the bilateral face along the angle of the mandible bilaterally and into the neck on the RIGHT. Disc levels: Mild degenerative changes throughout the spine greatest at C5-6. Upper chest: Negative. Other: None IMPRESSION: 1. No acute intracranial abnormality. Signs of white matter disease and atrophy perhaps advanced for age, correlate clinically with MRI follow-up on nonemergent basis as clinically warranted. 2. Bilateral nasal bone fractures with mild displacement. 3. Extensive soft tissue swelling about the face tracking into the scalp and face with extensive edema, also tracking into the RIGHT upper neck antro laterally. 4. Given the extensive nature of the edema would also correlate with any signs of cellulitis or infection. 5. No evidence of acute fracture or subluxation of the cervical  spine. 6. Mild degenerative changes throughout  the spine greatest at C5-6. Electronically Signed   By: Donzetta Kohut M.D.   On: 09/13/2019 13:53  .   ASSESSMENT: Facial cellulitis, slowly improving. Nasal fracture, minimally displaced.  PLAN: Will sign off as he is improving and there is no need for surgical intervention. Continue IV antibiotics with staph coverage. I can see him back in the office later this week to recheck the nose, but unlikely to require closed reduction of the fracture.   Sandi Mealy, MD 09/14/2019 3:30 PM

## 2019-09-14 NOTE — Progress Notes (Signed)
PROGRESS NOTE    Philip Owen  KWI:[id number] DOB: [birth year] DOA: 09/13/2019 PCP: Patient, No Pcp Per    Assessment & Plan:   Principal Problem:   Sepsis ([place]) Active Problems:   Facial cellulitis   AKI (acute kidney injury) ([place])    Philip Owen is a 44 y.o. Caucasian male with medical history significant for GERD and substance abuse who was brought into the emergency room by his father for evaluation of facial swelling.  Patient states that he had hit his head on the refrigerator the night prior to his admission and he also states that he fell off the couch and face planted on the floor.  He presents with massive forehead swelling and facial swelling as well as inability to open his eyes.  He also complains of severe headache associated with vomiting. Labs reveal a white count of 15.6 with a left shift, lactic acid of 2.0 and serum creatinine of 1.73. He had a CT scan of the head, maxillofacial and C-spine which showed bilateral nasal bone fractures with mild displacement. Extensive soft tissue swelling about the face tracking into the scalp and face with extensive edema, also tracking into the RIGHT upper neck antro laterally.   Sepsis from facial cellulitis (POA) Nasal fracture, minimally displaced. Patient presented with significant facial swelling, redness extending to his neck. --started patient empirically on IV antibiotic therapy PLAN: --continue Linezolid, cefepime and flagyl --Surgery signed off, will see pt as outpatient later this week  Acute kidney injury, resolved Etiology unclear and may be related to ATN from sepsis as patient has relative hypotension.   Cr improved with Aggressive IV fluid hydration --d/c MIVF since pt is eating/drinking  GERD Continue PPI   DVT prophylaxis: Lovenox SQ Code Status: Full code  Family Communication:  Status is: inpatient Dispo:   The patient is from: home Anticipated d/c is to: home Anticipated d/c date is: 1-2  days Patient currently is not medically stable to d/c due to: had sepsis from cellulitis, need 1-2 more days of IV abx.   Subjective and Interval History:  Pain and swelling of his face improved, can open eyes now.  Ate well.  No fever, dyspnea, chest pain, abdominal pain, N/V/D, dysuria.   Objective: Vitals:   09/14/19 0700 09/14/19 0743 09/14/19 0800 09/14/19 1100  BP: 109/87  129/85 125/79  Pulse: 98 (!) 103 96 91  Resp:    13  Temp:  98.8 F (37.1 C)    TempSrc:  Oral    SpO2: 99% 97% 99% 98%  Weight:      Height:        Intake/Output Summary (Last 24 hours) at 09/14/2019 1650 Last data filed at 09/14/2019 1200 Gross per 24 hour  Intake 3843.71 ml  Output 1050 ml  Net 2793.71 ml   Filed Weights   09/13/19 1219 09/14/19 0300  Weight: 74.8 kg 66.8 kg    Examination:   Constitutional: NAD, AAOx3 HEENT: conjunctivae and lids normal, EOMI, generalized swelling around both eyes and face CV: RRR no M,R,G. Distal pulses +2.  No cyanosis.   RESP: CTA B/L, normal respiratory effort  GI: +BS, NTND Extremities: No effusions, edema, or tenderness in BLE MSK: normal ROM and strength, no joint enlargement or tenderness of both UE and LE SKIN: warm, dry and intact Neuro: II - XII grossly intact.  Sensation intact Psych: Normal mood and affect.     Data Reviewed: I have personally reviewed following labs and imaging studies  CBC: Recent Labs  Lab 09/13/19 1328 09/14/19 0342  WBC 15.6* 21.0*  NEUTROABS 14.3*  --   HGB 14.2 11.1*  HCT 40.7 31.0*  MCV 84.6 82.4  PLT 235 171   Basic Metabolic Panel: Recent Labs  Lab 09/13/19 1328 09/14/19 0342  NA 130* 133*  K 4.0 4.4  CL 96* 104  CO2 22 21*  GLUCOSE 94 91  BUN 31* 25*  CREATININE 1.73* 1.07  CALCIUM 8.4* 7.3*  MG  --  1.2*  PHOS  --  3.1   GFR: Estimated Creatinine Clearance: 85.8 mL/min (by C-G formula based on SCr of 1.07 mg/dL). Liver Function Tests: Recent Labs  Lab 09/13/19 1328  AST 26  ALT 26    ALKPHOS 48  BILITOT 2.2*  PROT 6.5  ALBUMIN 3.7   No results for input(s): LIPASE, AMYLASE in the last 168 hours. No results for input(s): AMMONIA in the last 168 hours. Coagulation Profile: Recent Labs  Lab 09/14/19 0342  INR 2.1*   Cardiac Enzymes: Recent Labs  Lab 09/14/19 0112  CKTOTAL 115   BNP (last 3 results) No results for input(s): PROBNP in the last 8760 hours. HbA1C: No results for input(s): HGBA1C in the last 72 hours. CBG: Recent Labs  Lab 09/14/19 0254  GLUCAP 101*   Lipid Profile: No results for input(s): CHOL, HDL, LDLCALC, TRIG, CHOLHDL, LDLDIRECT in the last 72 hours. Thyroid Function Tests: No results for input(s): TSH, T4TOTAL, FREET4, T3FREE, THYROIDAB in the last 72 hours. Anemia Panel: No results for input(s): VITAMINB12, FOLATE, FERRITIN, TIBC, IRON, RETICCTPCT in the last 72 hours. Sepsis Labs: Recent Labs  Lab 09/13/19 1536 09/14/19 0112 09/14/19 0342 09/14/19 0408 09/14/19 0847  PROCALCITON  --   --  16.89  --   --   LATICACIDVEN 2.1* 2.4*  --  2.6* 2.5*    Recent Results (from the past 240 hour(s))  Blood culture (routine x 2)     Status: None (Preliminary result)   Collection Time: 09/13/19  1:28 PM   Specimen: BLOOD  Result Value Ref Range Status   Specimen Description BLOOD L WRIST  Final   Special Requests   Final    BOTTLES DRAWN AEROBIC AND ANAEROBIC Blood Culture results may not be optimal due to an excessive volume of blood received in culture bottles   Culture   Final    NO GROWTH < 24 HOURS Performed at [facility], [address] [place], [place] [address]    Report Status PENDING  Incomplete  Blood culture (routine x 2)     Status: None (Preliminary result)   Collection Time: 09/13/19  1:28 PM   Specimen: BLOOD  Result Value Ref Range Status   Specimen Description BLOOD L FA  Final   Special Requests   Final    BOTTLES DRAWN AEROBIC AND ANAEROBIC Blood Culture adequate volume   Culture   Final     NO GROWTH < 24 HOURS Performed at [facility], [address] [place], [place] [address]    Report Status PENDING  Incomplete  Aerobic/Anaerobic Culture (surgical/deep wound)     Status: None (Preliminary result)   Collection Time: 09/13/19  3:06 PM   Specimen: Wound  Result Value Ref Range Status   Specimen Description   Final    WOUND Performed at [facility], [address] [place], [place] [address]    Special Requests   Final    NONE Performed at [facility], [phone] [place], [place] [address]    Gram Stain   Final    MODERATE WBC PRESENT,BOTH PMN AND MONONUCLEAR MODERATE GRAM POSITIVE COCCI IN CHAINS IN PAIRS    Culture   Final    TOO YOUNG TO READ Performed at [facility], [address] [place], [place] [address]    Report Status PENDING  Incomplete  SARS Coronavirus 2 by RT PCR (hospital order, performed in [facility] hospital lab) Nasopharyngeal Nasopharyngeal Swab     Status: None   Collection Time: 09/13/19  8:21 PM   Specimen: Nasopharyngeal Swab  Result Value Ref Range Status   SARS Coronavirus 2 NEGATIVE NEGATIVE Final    Comment: (NOTE) SARS-CoV-2 target nucleic acids are NOT DETECTED. The SARS-CoV-2 RNA is generally detectable in upper and lower respiratory specimens during the acute phase of infection. The lowest concentration of SARS-CoV-2 viral copies this assay can detect is 250 copies / mL. A negative result does not preclude SARS-CoV-2 infection and should not be used as the sole basis for treatment or other patient management decisions.  A negative result may occur with improper specimen collection / handling, submission of specimen other than nasopharyngeal swab, presence of viral mutation(s) within the areas targeted by this assay, and inadequate number of viral copies (<250 copies / mL). A negative result must be combined with clinical observations, patient history, and epidemiological  information. Fact Sheet for Patients:   BoilerBrush.com.cyhttps://www.fda.gov/media/136312/download Fact Sheet for Healthcare Providers: https://pope.com/https://www.fda.gov/media/136313/download This test is not yet approved or cleared  by the [place] FDA and has been authorized for detection and/or diagnosis of SARS-CoV-2 by FDA under an Emergency Use Authorization (EUA).  This EUA will remain in effect (meaning this test can be used) for the duration of the COVID-19 declaration under Section 564(b)(1) of the Act, 21 U.S.C. section 360bbb-3(b)(1), unless the authorization is terminated or revoked sooner. Performed at [facility], [address] [place], [place] [address]   MRSA PCR Screening     Status: None   Collection Time: 09/14/19  2:57 AM   Specimen: Nasopharyngeal  Result Value Ref Range Status   MRSA by PCR NEGATIVE NEGATIVE Final    Comment:        The GeneXpert MRSA Assay (FDA approved for NASAL specimens only), is one component of a comprehensive MRSA colonization surveillance program. It is not intended to diagnose MRSA infection nor to guide or monitor treatment for MRSA infections. Performed at [facility], [address] [place], [place] [address]       Radiology Studies: CT Head Wo Contrast  Result Date: 09/13/2019 CLINICAL DATA:  Hit head on refrigerator. Redness across forehead significant swelling with severe pain and vomiting EXAM: CT HEAD WITHOUT CONTRAST CT MAXILLOFACIAL WITHOUT CONTRAST CT CERVICAL SPINE WITHOUT CONTRAST TECHNIQUE: Multidetector CT imaging of the head, cervical spine, and maxillofacial structures were performed using the standard protocol without intravenous contrast. Multiplanar CT image reconstructions of the cervical spine and maxillofacial structures were also generated. COMPARISON:  CT head from 03/15/2018 FINDINGS: CT HEAD FINDINGS Brain: No evidence of acute infarction, hemorrhage, hydrocephalus, extra-axial collection or mass  lesion/mass effect. Signs of white matter disease and atrophy perhaps advanced for age as discussed on prior imaging studies. Vascular: No hyperdense vessel or unexpected calcification. Skull: Signs of facial fractures, see below. Fractures of the nasal bone with mild displacement are noted. Other: Extensive soft tissue swelling overlying the bilateral orbits and frontal area tracking into the RIGHT and LEFT  scalp overlying the temporalis musculature bilaterally. CT MAXILLOFACIAL FINDINGS Osseous: Bilateral nasal bone fractures with mild displacement. Zygomatic arches are intact. No signs of mastoid effusion. Pterygoid are intact. Sinuses are clear. No visible mandibular fracture. Orbits: Marked preseptal swelling. No retro bulbar stranding. Sinuses: Clear. Soft tissues: Extensive soft tissue swelling about the face tracking into the scalp along the RIGHT and LEFT scalp and along the RIGHT and LEFT face with extensive edema in the soft tissues of the scalp and face. Extends posteriorly bilaterally to approximately the parietal region. CT CERVICAL SPINE FINDINGS Alignment: Normal. Skull base and vertebrae: No acute fracture. No primary bone lesion or focal pathologic process. Soft tissues and spinal canal: Stranding extending along the bilateral face along the angle of the mandible bilaterally and into the neck on the RIGHT. Disc levels: Mild degenerative changes throughout the spine greatest at C5-6. Upper chest: Negative. Other: None IMPRESSION: 1. No acute intracranial abnormality. Signs of white matter disease and atrophy perhaps advanced for age, correlate clinically with MRI follow-up on nonemergent basis as clinically warranted. 2. Bilateral nasal bone fractures with mild displacement. 3. Extensive soft tissue swelling about the face tracking into the scalp and face with extensive edema, also tracking into the RIGHT upper neck antro laterally. 4. Given the extensive nature of the edema would also correlate  with any signs of cellulitis or infection. 5. No evidence of acute fracture or subluxation of the cervical spine. 6. Mild degenerative changes throughout the spine greatest at C5-6. Electronically Signed   By: Donzetta Kohut M.D.   On: 09/13/2019 13:53   CT Cervical Spine Wo Contrast  Result Date: 09/13/2019 CLINICAL DATA:  Hit head on refrigerator. Redness across forehead significant swelling with severe pain and vomiting EXAM: CT HEAD WITHOUT CONTRAST CT MAXILLOFACIAL WITHOUT CONTRAST CT CERVICAL SPINE WITHOUT CONTRAST TECHNIQUE: Multidetector CT imaging of the head, cervical spine, and maxillofacial structures were performed using the standard protocol without intravenous contrast. Multiplanar CT image reconstructions of the cervical spine and maxillofacial structures were also generated. COMPARISON:  CT head from 03/15/2018 FINDINGS: CT HEAD FINDINGS Brain: No evidence of acute infarction, hemorrhage, hydrocephalus, extra-axial collection or mass lesion/mass effect. Signs of white matter disease and atrophy perhaps advanced for age as discussed on prior imaging studies. Vascular: No hyperdense vessel or unexpected calcification. Skull: Signs of facial fractures, see below. Fractures of the nasal bone with mild displacement are noted. Other: Extensive soft tissue swelling overlying the bilateral orbits and frontal area tracking into the RIGHT and LEFT scalp overlying the temporalis musculature bilaterally. CT MAXILLOFACIAL FINDINGS Osseous: Bilateral nasal bone fractures with mild displacement. Zygomatic arches are intact. No signs of mastoid effusion. Pterygoid are intact. Sinuses are clear. No visible mandibular fracture. Orbits: Marked preseptal swelling. No retro bulbar stranding. Sinuses: Clear. Soft tissues: Extensive soft tissue swelling about the face tracking into the scalp along the RIGHT and LEFT scalp and along the RIGHT and LEFT face with extensive edema in the soft tissues of the scalp and face.  Extends posteriorly bilaterally to approximately the parietal region. CT CERVICAL SPINE FINDINGS Alignment: Normal. Skull base and vertebrae: No acute fracture. No primary bone lesion or focal pathologic process. Soft tissues and spinal canal: Stranding extending along the bilateral face along the angle of the mandible bilaterally and into the neck on the RIGHT. Disc levels: Mild degenerative changes throughout the spine greatest at C5-6. Upper chest: Negative. Other: None IMPRESSION: 1. No acute intracranial abnormality. Signs of white matter disease and atrophy perhaps  advanced for age, correlate clinically with MRI follow-up on nonemergent basis as clinically warranted. 2. Bilateral nasal bone fractures with mild displacement. 3. Extensive soft tissue swelling about the face tracking into the scalp and face with extensive edema, also tracking into the RIGHT upper neck antro laterally. 4. Given the extensive nature of the edema would also correlate with any signs of cellulitis or infection. 5. No evidence of acute fracture or subluxation of the cervical spine. 6. Mild degenerative changes throughout the spine greatest at C5-6. Electronically Signed   By: Donzetta Kohut M.D.   On: 09/13/2019 13:53   CT Maxillofacial Wo Contrast  Result Date: 09/13/2019 CLINICAL DATA:  Hit head on refrigerator. Redness across forehead significant swelling with severe pain and vomiting EXAM: CT HEAD WITHOUT CONTRAST CT MAXILLOFACIAL WITHOUT CONTRAST CT CERVICAL SPINE WITHOUT CONTRAST TECHNIQUE: Multidetector CT imaging of the head, cervical spine, and maxillofacial structures were performed using the standard protocol without intravenous contrast. Multiplanar CT image reconstructions of the cervical spine and maxillofacial structures were also generated. COMPARISON:  CT head from 03/15/2018 FINDINGS: CT HEAD FINDINGS Brain: No evidence of acute infarction, hemorrhage, hydrocephalus, extra-axial collection or mass lesion/mass  effect. Signs of white matter disease and atrophy perhaps advanced for age as discussed on prior imaging studies. Vascular: No hyperdense vessel or unexpected calcification. Skull: Signs of facial fractures, see below. Fractures of the nasal bone with mild displacement are noted. Other: Extensive soft tissue swelling overlying the bilateral orbits and frontal area tracking into the RIGHT and LEFT scalp overlying the temporalis musculature bilaterally. CT MAXILLOFACIAL FINDINGS Osseous: Bilateral nasal bone fractures with mild displacement. Zygomatic arches are intact. No signs of mastoid effusion. Pterygoid are intact. Sinuses are clear. No visible mandibular fracture. Orbits: Marked preseptal swelling. No retro bulbar stranding. Sinuses: Clear. Soft tissues: Extensive soft tissue swelling about the face tracking into the scalp along the RIGHT and LEFT scalp and along the RIGHT and LEFT face with extensive edema in the soft tissues of the scalp and face. Extends posteriorly bilaterally to approximately the parietal region. CT CERVICAL SPINE FINDINGS Alignment: Normal. Skull base and vertebrae: No acute fracture. No primary bone lesion or focal pathologic process. Soft tissues and spinal canal: Stranding extending along the bilateral face along the angle of the mandible bilaterally and into the neck on the RIGHT. Disc levels: Mild degenerative changes throughout the spine greatest at C5-6. Upper chest: Negative. Other: None IMPRESSION: 1. No acute intracranial abnormality. Signs of white matter disease and atrophy perhaps advanced for age, correlate clinically with MRI follow-up on nonemergent basis as clinically warranted. 2. Bilateral nasal bone fractures with mild displacement. 3. Extensive soft tissue swelling about the face tracking into the scalp and face with extensive edema, also tracking into the RIGHT upper neck antro laterally. 4. Given the extensive nature of the edema would also correlate with any signs  of cellulitis or infection. 5. No evidence of acute fracture or subluxation of the cervical spine. 6. Mild degenerative changes throughout the spine greatest at C5-6. Electronically Signed   By: Donzetta Kohut M.D.   On: 09/13/2019 13:53     Scheduled Meds: . Chlorhexidine Gluconate Cloth  6 each Topical Daily  . dexamethasone (DECADRON) injection  4 mg Intravenous Q6H  . enoxaparin (LOVENOX) injection  40 mg Subcutaneous Q24H  . gentamicin ointment   Topical TID  . pantoprazole  40 mg Oral Daily   Continuous Infusions: . ceFEPime (MAXIPIME) IV 2 g (09/14/19 1337)  . linezolid (ZYVOX)  IV Stopped (09/14/19 1108)  . metronidazole 500 mg (09/14/19 1546)     LOS: 1 day     Darlin Priestly, MD [facility] If 7PM-7AM, please contact night-coverage 09/14/2019, 4:50 PM

## 2019-09-14 NOTE — Progress Notes (Signed)
Pt stated he received tetanus injection 1 yrs ago following a injury with a nail.  Therefore, will cancel Tenivac order.  Sonda Rumble, AGNP  Pulmonary/Critical Care Pager [phone] (please enter 7 digits) PCCM Consult Pager [phone] (please enter 7 digits)

## 2019-09-14 NOTE — Progress Notes (Signed)
Facial swelling and pain have decreased over the day. ENT and Intensivist have signed off. A&O x 4, VSS, up to BR, minimal assistance.

## 2019-09-14 NOTE — Progress Notes (Signed)
SYNOPSIS 44 yo male admitted with sepsis secondary to facial cellulitis and bilateral nasal bone fractures with mild displacement s/p fall   SIGNIFICANT EVENTS/STUDIES:  05/14: Pt presented to [facility] ER with head injury and pain 05/14: CT Head/Cervical Spine/Maxillofacial revealed no acute intracranial abnormality.  Signs of white matter disease and atrophy perhaps advanced for age, correlate clinically with MRI follow-up on nonemergent basis as clinically warranted. Bilateral nasal bone fractures with mild displacement. Extensive soft tissue swelling about the face tracking into the scalp and face with extensive edema, also tracking into the RIGHT upper neck antro laterally. Given the extensive nature of the edema would also correlate with any signs of cellulitis or infection. No evidence of acute fracture or subluxation of the cervical spine. Mild degenerative changes throughout the spine greatest at C5-6 05/14: Pt admitted to the stepdown unit per hospitalist team PCCM consulted to assist with management.  5/15 swelling very slowly improving  CC  Facial swelling    HPI Alert and awake Groggy +neck pain Eyes shut but able to open Severe sepsis Responding to IVF's       VITALS: BP 109/87   Pulse (!) 103   Temp 98.8 F (37.1 C) (Oral)   Resp 17   Ht 5\' 8"  (1.727 m)   Wt 66.8 kg   SpO2 97%   BMI 22.39 kg/m     I/O last 3 completed shifts: In: 2860.9 [I.V.:945.9; IV Piggyback:1915] Out: 650 [Urine:650] Total I/O In: -  Out: 400 [Urine:400]  SpO2: 97 %  Estimated body mass index is 22.39 kg/m as calculated from the following:   Height as of this encounter: 5\' 8"  (1.727 m).   Weight as of this encounter: 66.8 kg.   Review of Systems:  Gen:  +pain and swelling HEENT: Denies blurred vision, double vision, ear pain, eye pain, hearing loss, nose bleeds, sore throat Cardiac:  No dizziness, chest pain or heaviness, chest tightness,edema, No JVD Resp:   No cough,  -sputum production, -shortness of breath,-wheezing, -hemoptysis,   Other:  All other systems negative      Physical Examination:   General Appearance: ill appearing No distress  Neuro:without focal findings,  speech normal,  HEENT: PERRLA, EOM intact.   dentition within normal limits, bilateral neck tenderness with palpation but no crepitus present, edema of the head in the frontotemporal area that extends to the right neck and bilateral periorbital edema able to open both eyes Pulmonary: normal breath sounds, No wheezing.  CardiovascularNormal S1,S2.  No m/r/g.   Abdomen: Benign, Soft, non-tender. Renal:  No costovertebral tenderness  GU:  Not performed at this time. Endoc: No evident thyromegaly Skin:  laceration to left midline scalp just inside the hairline with scant dried blood and right eye ecchymosis   Extremities: normal, no cyanosis, clubbing. PSYCHIATRIC: Mood, affect within normal limits.     I personally reviewed Labs under Results section.   MEDICATIONS: I have reviewed all medications and confirmed regimen as documented   CULTURE RESULTS   Recent Results (from the past 240 hour(s))  Blood culture (routine x 2)     Status: None (Preliminary result)   Collection Time: 09/13/19  1:28 PM   Specimen: BLOOD  Result Value Ref Range Status   Specimen Description BLOOD L WRIST  Final   Special Requests   Final    BOTTLES DRAWN AEROBIC AND ANAEROBIC Blood Culture results may not be optimal due to an excessive volume of blood received in culture bottles   Culture  Final    NO GROWTH < 24 HOURS Performed at [facility], [address] [place], [place] [address]    Report Status PENDING  Incomplete  Blood culture (routine x 2)     Status: None (Preliminary result)   Collection Time: 09/13/19  1:28 PM   Specimen: BLOOD  Result Value Ref Range Status   Specimen Description BLOOD L FA  Final   Special Requests   Final    BOTTLES DRAWN AEROBIC AND  ANAEROBIC Blood Culture adequate volume   Culture   Final    NO GROWTH < 24 HOURS Performed at [facility], [address], [place], [place] [address]    Report Status PENDING  Incomplete  Aerobic/Anaerobic Culture (surgical/deep wound)     Status: None (Preliminary result)   Collection Time: 09/13/19  3:06 PM   Specimen: Wound  Result Value Ref Range Status   Specimen Description   Final    WOUND Performed at [facility], [address], [place], [place] [address]    Special Requests   Final    NONE Performed at [facility], [address] [place], [place] [address]    Gram Stain   Final    MODERATE WBC PRESENT,BOTH PMN AND MONONUCLEAR MODERATE GRAM POSITIVE COCCI IN CHAINS IN PAIRS Performed at [facility], [address] [place], [place] [address]    Culture PENDING  Incomplete   Report Status PENDING  Incomplete  SARS Coronavirus 2 by RT PCR (hospital order, performed in [facility] hospital lab) Nasopharyngeal Nasopharyngeal Swab     Status: None   Collection Time: 09/13/19  8:21 PM   Specimen: Nasopharyngeal Swab  Result Value Ref Range Status   SARS Coronavirus 2 NEGATIVE NEGATIVE Final    Comment: (NOTE) SARS-CoV-2 target nucleic acids are NOT DETECTED. The SARS-CoV-2 RNA is generally detectable in upper and lower respiratory specimens during the acute phase of infection. The lowest concentration of SARS-CoV-2 viral copies this assay can detect is 250 copies / mL. A negative result does not preclude SARS-CoV-2 infection and should not be used as the sole basis for treatment or other patient management decisions.  A negative result may occur with improper specimen collection / handling, submission of specimen other than nasopharyngeal swab, presence of viral mutation(s) within the areas targeted by this assay, and inadequate number of viral copies (<250 copies / mL). A negative result must be combined with  clinical observations, patient history, and epidemiological information. Fact Sheet for Patients:   BoilerBrush.com.cy Fact Sheet for Healthcare Providers: https://pope.com/ This test is not yet approved or cleared  by the [place] FDA and has been authorized for detection and/or diagnosis of SARS-CoV-2 by FDA under an Emergency Use Authorization (EUA).  This EUA will remain in effect (meaning this test can be used) for the duration of the COVID-19 declaration under Section 564(b)(1) of the Act, 21 U.S.C. section 360bbb-3(b)(1), unless the authorization is terminated or revoked sooner. Performed at [facility], [address] [place], [place] [address]   MRSA PCR Screening     Status: None   Collection Time: 09/14/19  2:57 AM   Specimen: Nasopharyngeal  Result Value Ref Range Status   MRSA by PCR NEGATIVE NEGATIVE Final    Comment:        The GeneXpert MRSA Assay (FDA approved for NASAL specimens only), is one component of a comprehensive MRSA colonization surveillance program. It is not intended to diagnose MRSA  infection nor to guide or monitor treatment for MRSA infections. Performed at [facility], [address] [place], [place] [id number]           IMAGING    CT Head Wo Contrast  Result Date: 09/13/2019 CLINICAL DATA:  Hit head on refrigerator. Redness across forehead significant swelling with severe pain and vomiting EXAM: CT HEAD WITHOUT CONTRAST CT MAXILLOFACIAL WITHOUT CONTRAST CT CERVICAL SPINE WITHOUT CONTRAST TECHNIQUE: Multidetector CT imaging of the head, cervical spine, and maxillofacial structures were performed using the standard protocol without intravenous contrast. Multiplanar CT image reconstructions of the cervical spine and maxillofacial structures were also generated. COMPARISON:  CT head from 03/15/2018 FINDINGS: CT HEAD FINDINGS Brain: No evidence of acute infarction,  hemorrhage, hydrocephalus, extra-axial collection or mass lesion/mass effect. Signs of white matter disease and atrophy perhaps advanced for age as discussed on prior imaging studies. Vascular: No hyperdense vessel or unexpected calcification. Skull: Signs of facial fractures, see below. Fractures of the nasal bone with mild displacement are noted. Other: Extensive soft tissue swelling overlying the bilateral orbits and frontal area tracking into the RIGHT and LEFT scalp overlying the temporalis musculature bilaterally. CT MAXILLOFACIAL FINDINGS Osseous: Bilateral nasal bone fractures with mild displacement. Zygomatic arches are intact. No signs of mastoid effusion. Pterygoid are intact. Sinuses are clear. No visible mandibular fracture. Orbits: Marked preseptal swelling. No retro bulbar stranding. Sinuses: Clear. Soft tissues: Extensive soft tissue swelling about the face tracking into the scalp along the RIGHT and LEFT scalp and along the RIGHT and LEFT face with extensive edema in the soft tissues of the scalp and face. Extends posteriorly bilaterally to approximately the parietal region. CT CERVICAL SPINE FINDINGS Alignment: Normal. Skull base and vertebrae: No acute fracture. No primary bone lesion or focal pathologic process. Soft tissues and spinal canal: Stranding extending along the bilateral face along the angle of the mandible bilaterally and into the neck on the RIGHT. Disc levels: Mild degenerative changes throughout the spine greatest at C5-6. Upper chest: Negative. Other: None IMPRESSION: 1. No acute intracranial abnormality. Signs of white matter disease and atrophy perhaps advanced for age, correlate clinically with MRI follow-up on nonemergent basis as clinically warranted. 2. Bilateral nasal bone fractures with mild displacement. 3. Extensive soft tissue swelling about the face tracking into the scalp and face with extensive edema, also tracking into the RIGHT upper neck antro laterally. 4. Given  the extensive nature of the edema would also correlate with any signs of cellulitis or infection. 5. No evidence of acute fracture or subluxation of the cervical spine. 6. Mild degenerative changes throughout the spine greatest at C5-6. Electronically Signed   By: Donzetta KohutGeoffrey  Wile M.D.   On: 09/13/2019 13:53   CT Cervical Spine Wo Contrast  Result Date: 09/13/2019 CLINICAL DATA:  Hit head on refrigerator. Redness across forehead significant swelling with severe pain and vomiting EXAM: CT HEAD WITHOUT CONTRAST CT MAXILLOFACIAL WITHOUT CONTRAST CT CERVICAL SPINE WITHOUT CONTRAST TECHNIQUE: Multidetector CT imaging of the head, cervical spine, and maxillofacial structures were performed using the standard protocol without intravenous contrast. Multiplanar CT image reconstructions of the cervical spine and maxillofacial structures were also generated. COMPARISON:  CT head from 03/15/2018 FINDINGS: CT HEAD FINDINGS Brain: No evidence of acute infarction, hemorrhage, hydrocephalus, extra-axial collection or mass lesion/mass effect. Signs of white matter disease and atrophy perhaps advanced for age as discussed on prior imaging studies. Vascular: No hyperdense vessel or unexpected calcification. Skull: Signs of facial fractures, see below. Fractures of the  nasal bone with mild displacement are noted. Other: Extensive soft tissue swelling overlying the bilateral orbits and frontal area tracking into the RIGHT and LEFT scalp overlying the temporalis musculature bilaterally. CT MAXILLOFACIAL FINDINGS Osseous: Bilateral nasal bone fractures with mild displacement. Zygomatic arches are intact. No signs of mastoid effusion. Pterygoid are intact. Sinuses are clear. No visible mandibular fracture. Orbits: Marked preseptal swelling. No retro bulbar stranding. Sinuses: Clear. Soft tissues: Extensive soft tissue swelling about the face tracking into the scalp along the RIGHT and LEFT scalp and along the RIGHT and LEFT face with  extensive edema in the soft tissues of the scalp and face. Extends posteriorly bilaterally to approximately the parietal region. CT CERVICAL SPINE FINDINGS Alignment: Normal. Skull base and vertebrae: No acute fracture. No primary bone lesion or focal pathologic process. Soft tissues and spinal canal: Stranding extending along the bilateral face along the angle of the mandible bilaterally and into the neck on the RIGHT. Disc levels: Mild degenerative changes throughout the spine greatest at C5-6. Upper chest: Negative. Other: None IMPRESSION: 1. No acute intracranial abnormality. Signs of white matter disease and atrophy perhaps advanced for age, correlate clinically with MRI follow-up on nonemergent basis as clinically warranted. 2. Bilateral nasal bone fractures with mild displacement. 3. Extensive soft tissue swelling about the face tracking into the scalp and face with extensive edema, also tracking into the RIGHT upper neck antro laterally. 4. Given the extensive nature of the edema would also correlate with any signs of cellulitis or infection. 5. No evidence of acute fracture or subluxation of the cervical spine. 6. Mild degenerative changes throughout the spine greatest at C5-6. Electronically Signed   By: Donzetta Kohut M.D.   On: 09/13/2019 13:53   CT Maxillofacial Wo Contrast  Result Date: 09/13/2019 CLINICAL DATA:  Hit head on refrigerator. Redness across forehead significant swelling with severe pain and vomiting EXAM: CT HEAD WITHOUT CONTRAST CT MAXILLOFACIAL WITHOUT CONTRAST CT CERVICAL SPINE WITHOUT CONTRAST TECHNIQUE: Multidetector CT imaging of the head, cervical spine, and maxillofacial structures were performed using the standard protocol without intravenous contrast. Multiplanar CT image reconstructions of the cervical spine and maxillofacial structures were also generated. COMPARISON:  CT head from 03/15/2018 FINDINGS: CT HEAD FINDINGS Brain: No evidence of acute infarction, hemorrhage,  hydrocephalus, extra-axial collection or mass lesion/mass effect. Signs of white matter disease and atrophy perhaps advanced for age as discussed on prior imaging studies. Vascular: No hyperdense vessel or unexpected calcification. Skull: Signs of facial fractures, see below. Fractures of the nasal bone with mild displacement are noted. Other: Extensive soft tissue swelling overlying the bilateral orbits and frontal area tracking into the RIGHT and LEFT scalp overlying the temporalis musculature bilaterally. CT MAXILLOFACIAL FINDINGS Osseous: Bilateral nasal bone fractures with mild displacement. Zygomatic arches are intact. No signs of mastoid effusion. Pterygoid are intact. Sinuses are clear. No visible mandibular fracture. Orbits: Marked preseptal swelling. No retro bulbar stranding. Sinuses: Clear. Soft tissues: Extensive soft tissue swelling about the face tracking into the scalp along the RIGHT and LEFT scalp and along the RIGHT and LEFT face with extensive edema in the soft tissues of the scalp and face. Extends posteriorly bilaterally to approximately the parietal region. CT CERVICAL SPINE FINDINGS Alignment: Normal. Skull base and vertebrae: No acute fracture. No primary bone lesion or focal pathologic process. Soft tissues and spinal canal: Stranding extending along the bilateral face along the angle of the mandible bilaterally and into the neck on the RIGHT. Disc levels: Mild degenerative changes throughout  the spine greatest at C5-6. Upper chest: Negative. Other: None IMPRESSION: 1. No acute intracranial abnormality. Signs of white matter disease and atrophy perhaps advanced for age, correlate clinically with MRI follow-up on nonemergent basis as clinically warranted. 2. Bilateral nasal bone fractures with mild displacement. 3. Extensive soft tissue swelling about the face tracking into the scalp and face with extensive edema, also tracking into the RIGHT upper neck antro laterally. 4. Given the  extensive nature of the edema would also correlate with any signs of cellulitis or infection. 5. No evidence of acute fracture or subluxation of the cervical spine. 6. Mild degenerative changes throughout the spine greatest at C5-6. Electronically Signed   By: Zetta Bills M.D.   On: 09/13/2019 13:53    CBC    Component Value Date/Time   WBC 21.0 (H) 09/14/2019 0342   RBC 3.76 (L) 09/14/2019 0342   HGB 11.1 (L) 09/14/2019 0342   HGB 12.0 (L) 01/02/2013 0638   HCT 31.0 (L) 09/14/2019 0342   HCT 34.6 (L) 01/02/2013 0638   PLT 171 09/14/2019 0342   PLT 633 (H) 01/02/2013 0638   MCV 82.4 09/14/2019 0342   MCV 84 01/02/2013 0638   MCH 29.5 09/14/2019 0342   MCHC 35.8 09/14/2019 0342   RDW 12.2 09/14/2019 0342   RDW 13.7 01/02/2013 0638   LYMPHSABS 0.2 (L) 09/13/2019 1328   LYMPHSABS 2.9 01/02/2013 0638   MONOABS 0.6 09/13/2019 1328   MONOABS 0.7 01/02/2013 0638   EOSABS 0.0 09/13/2019 1328   EOSABS 0.3 01/02/2013 0638   BASOSABS 0.1 09/13/2019 1328   BASOSABS 0.1 01/02/2013 0638   BMP Latest Ref Rng & Units 09/14/2019 09/13/2019 03/15/2018  Glucose 70 - 99 mg/dL 91 94 292(H)  BUN 6 - 20 mg/dL 25(H) 31(H) 16  Creatinine 0.61 - 1.24 mg/dL 1.07 1.73(H) 1.29(H)  Sodium 135 - 145 mmol/L 133(L) 130(L) 136  Potassium 3.5 - 5.1 mmol/L 4.4 4.0 3.6  Chloride 98 - 111 mmol/L 104 96(L) 99  CO2 22 - 32 mmol/L 21(L) 22 26  Calcium 8.9 - 10.3 mg/dL 7.3(L) 8.4(L) 9.1        ASSESSMENT AND PLAN SYNOPSIS Severe sepsis from acute facial swelling and cellulitis with nasal bone fractures and s/p fall with acute renal failure with cocaine abuse  Sepsis -follow LA -follow up cultures -emperic ABX -consider stress dose steroids -aggressive IV fluid Resuscitation Follow up ENT recs   ACUTE KIDNEY INJURY/Renal Failure -continue Foley Catheter-assess need -Avoid nephrotoxic agents -Follow urine output, BMP -Ensure adequate renal perfusion, optimize oxygenation -Renal dose  medications  ELECTROLYTES -follow labs as needed -replace as needed -pharmacy consultation and following   DVT/GI PRX ordered and assessed TRANSFUSIONS AS NEEDED MONITOR FSBS I Assessed the need for Labs I Assessed the need for Foley I Assessed the need for Central Venous Line Family Discussion when available I Assessed the need for Mobilization I made an Assessment of medications to be adjusted accordingly Safety Risk assessment completed  CASE DISCUSSED IN MULTIDISCIPLINARY ROUNDS WITH ICU TEAM    Chevon Fomby Patricia Pesa, M.D.  [facility]  Medical Director [place] Director [place] Department

## 2019-09-15 LAB — CBC WITH DIFFERENTIAL/PLATELET
Abs Immature Granulocytes: 2.18 10*3/uL — ABNORMAL HIGH (ref 0.00–0.07)
Basophils Absolute: 0.2 10*3/uL — ABNORMAL HIGH (ref 0.0–0.1)
Basophils Relative: 1 %
Eosinophils Absolute: 0.1 10*3/uL (ref 0.0–0.5)
Eosinophils Relative: 0 %
HCT: 32.7 % — ABNORMAL LOW (ref 39.0–52.0)
Hemoglobin: 11.5 g/dL — ABNORMAL LOW (ref 13.0–17.0)
Immature Granulocytes: 8 %
Lymphocytes Relative: 2 %
Lymphs Abs: 0.4 10*3/uL — ABNORMAL LOW (ref 0.7–4.0)
MCH: 29 pg (ref 26.0–34.0)
MCHC: 35.2 g/dL (ref 30.0–36.0)
MCV: 82.4 fL (ref 80.0–100.0)
Monocytes Absolute: 0.8 10*3/uL (ref 0.1–1.0)
Monocytes Relative: 3 %
Neutro Abs: 22.3 10*3/uL — ABNORMAL HIGH (ref 1.7–7.7)
Neutrophils Relative %: 86 %
Platelets: 231 10*3/uL (ref 150–400)
RBC: 3.97 MIL/uL — ABNORMAL LOW (ref 4.22–5.81)
RDW: 12.4 % (ref 11.5–15.5)
Smear Review: NORMAL
WBC: 25.4 10*3/uL — ABNORMAL HIGH (ref 4.0–10.5)
nRBC: 0 % (ref 0.0–0.2)

## 2019-09-15 LAB — COMPREHENSIVE METABOLIC PANEL
ALT: 70 U/L — ABNORMAL HIGH (ref 0–44)
AST: 32 U/L (ref 15–41)
Albumin: 2.8 g/dL — ABNORMAL LOW (ref 3.5–5.0)
Alkaline Phosphatase: 49 U/L (ref 38–126)
Anion gap: 5 (ref 5–15)
BUN: 16 mg/dL (ref 6–20)
CO2: 24 mmol/L (ref 22–32)
Calcium: 8.3 mg/dL — ABNORMAL LOW (ref 8.9–10.3)
Chloride: 108 mmol/L (ref 98–111)
Creatinine, Ser: 0.63 mg/dL (ref 0.61–1.24)
GFR calc Af Amer: >60 mL/min (ref >60)
GFR calc non Af Amer: >60 mL/min (ref >60)
Glucose, Bld: 141 mg/dL — ABNORMAL HIGH (ref 70–99)
Potassium: 4.8 mmol/L (ref 3.5–5.1)
Sodium: 137 mmol/L (ref 135–145)
Total Bilirubin: 0.7 mg/dL (ref 0.3–1.2)
Total Protein: 5.7 g/dL — ABNORMAL LOW (ref 6.5–8.1)

## 2019-09-15 LAB — PROCALCITONIN: Procalcitonin: 7.43 ng/mL

## 2019-09-15 LAB — PHOSPHORUS: Phosphorus: 1.4 mg/dL — ABNORMAL LOW (ref 2.5–4.6)

## 2019-09-15 LAB — MAGNESIUM: Magnesium: 2.8 mg/dL — ABNORMAL HIGH (ref 1.7–2.4)

## 2019-09-15 MED ORDER — LINEZOLID 600 MG PO TABS
600.0000 mg | ORAL_TABLET | Freq: Two times a day (BID) | ORAL | Status: DC
  Administered 2019-09-15 – 2019-09-16 (×3): 600 mg via ORAL
  Filled 2019-09-15 (×4): qty 1

## 2019-09-15 MED ORDER — METRONIDAZOLE 500 MG PO TABS: 500.0000 mg | ORAL_TABLET | Freq: Three times a day (TID) | ORAL | Status: DC

## 2019-09-15 MED ORDER — DEXAMETHASONE 4 MG PO TABS
6.0000 mg | ORAL_TABLET | Freq: Every day | ORAL | Status: DC
  Administered 2019-09-16: 6 mg via ORAL
  Filled 2019-09-15: qty 1.5

## 2019-09-15 NOTE — Progress Notes (Signed)
PROGRESS NOTE    Philip Owen  CHY:[id number] DOB: Aug 25, 1975 DOA: 09/13/2019 PCP: Patient, No Pcp Per    Assessment & Plan:   Principal Problem:   Sepsis (HCC) Active Problems:   Facial cellulitis   AKI (acute kidney injury) (HCC)    Philip Owen is a 44 y.o. Caucasian male with medical history significant for GERD and substance abuse who was brought into the emergency room by his father for evaluation of facial swelling.  Patient states that he had hit his head on the refrigerator the night prior to his admission and he also states that he fell off the couch and face planted on the floor.  He presents with massive forehead swelling and facial swelling as well as inability to open his eyes.  He also complains of severe headache associated with vomiting. Labs reveal a white count of 15.6 with a left shift, lactic acid of 2.0 and serum creatinine of 1.73. He had a CT scan of the head, maxillofacial and C-spine which showed bilateral nasal bone fractures with mild displacement. Extensive soft tissue swelling about the face tracking into the scalp and face with extensive edema, also tracking into the RIGHT upper neck antro laterally.   Sepsis from facial cellulitis (POA) Nasal fracture, minimally displaced. Patient presented with significant facial swelling, redness extending to his neck. --started patient empirically on IV antibiotic therapy PLAN: --continue Linezolid, cefepime  --d/c flagyl since wound culture shows no anaerobes , per pharm --Surgery signed off, will see pt as outpatient later this week --will transition to oral abx tomorrow to complete a 10-day course  Acute kidney injury, resolved Etiology unclear and may be related to ATN from sepsis as patient has relative hypotension.   Cr improved with Aggressive IV fluid hydration --d/c MIVF since pt is eating/drinking  GERD Continue PPI   DVT prophylaxis: Lovenox SQ Code Status: Full code  Family  Communication:  Status is: inpatient Dispo:   The patient is from: home Anticipated d/c is to: home Anticipated d/c date is: tomorrow Patient currently is not medically stable to d/c due to: had sepsis from cellulitis, need 1 more days of IV abx.   Subjective and Interval History:  Facial swelling improved, still having some pain which is controlled by current pain regimen.  Normal PO intake.  No fever, dyspnea, chest pain, abdominal pain, N/V/D, dysuria.   Objective: Vitals:   09/14/19 1856 09/15/19 0040 09/15/19 0753 09/15/19 1127  BP: 119/82 (!) 140/97 134/84 130/82  Pulse: 95 98 87 88  Resp:  16    Temp: 98.7 F (37.1 C) 98.9 F (37.2 C) 98.2 F (36.8 C) 98.2 F (36.8 C)  TempSrc: Oral Oral Oral Oral  SpO2: 98% 100% 98% 98%  Weight:      Height:        Intake/Output Summary (Last 24 hours) at 09/15/2019 1554 Last data filed at 09/15/2019 1351 Gross per 24 hour  Intake 959.88 ml  Output --  Net 959.88 ml   Filed Weights   09/13/19 1219 09/14/19 0300  Weight: 74.8 kg 66.8 kg    Examination:   Constitutional: NAD, AAOx3 HEENT: conjunctivae and lids normal, EOMI, generalized swelling around both eyes and face much improved CV: RRR no M,R,G. Distal pulses +2.  No cyanosis.   RESP: CTA B/L, normal respiratory effort  GI: +BS, NTND Extremities: No effusions, edema, or tenderness in BLE MSK: normal ROM and strength, no joint enlargement or tenderness of both UE and LE SKIN:  warm, dry and intact Neuro: II - XII grossly intact.  Sensation intact Psych: Normal mood and affect.     Data Reviewed: I have personally reviewed following labs and imaging studies  CBC: Recent Labs  Lab 09/13/19 1328 09/14/19 0342 09/15/19 0612  WBC 15.6* 21.0* 25.4*  NEUTROABS 14.3*  --  22.3*  HGB 14.2 11.1* 11.5*  HCT 40.7 31.0* 32.7*  MCV 84.6 82.4 82.4  PLT 235 171 231   Basic Metabolic Panel: Recent Labs  Lab 09/13/19 1328 09/14/19 0342 09/15/19 0612  NA 130* 133* 137   K 4.0 4.4 4.8  CL 96* 104 108  CO2 22 21* 24  GLUCOSE 94 91 141*  BUN 31* 25* 16  CREATININE 1.73* 1.07 0.63  CALCIUM 8.4* 7.3* 8.3*  MG  --  1.2* 2.8*  PHOS  --  3.1 1.4*   GFR: Estimated Creatinine Clearance: 114.8 mL/min (by C-G formula based on SCr of 0.63 mg/dL). Liver Function Tests: Recent Labs  Lab 09/13/19 1328 09/15/19 0612  AST 26 32  ALT 26 70*  ALKPHOS 48 49  BILITOT 2.2* 0.7  PROT 6.5 5.7*  ALBUMIN 3.7 2.8*   No results for input(s): LIPASE, AMYLASE in the last 168 hours. No results for input(s): AMMONIA in the last 168 hours. Coagulation Profile: Recent Labs  Lab 09/14/19 0342  INR 2.1*   Cardiac Enzymes: Recent Labs  Lab 09/14/19 0112  CKTOTAL 115   BNP (last 3 results) No results for input(s): PROBNP in the last 8760 hours. HbA1C: No results for input(s): HGBA1C in the last 72 hours. CBG: Recent Labs  Lab 09/14/19 0254  GLUCAP 101*   Lipid Profile: No results for input(s): CHOL, HDL, LDLCALC, TRIG, CHOLHDL, LDLDIRECT in the last 72 hours. Thyroid Function Tests: No results for input(s): TSH, T4TOTAL, FREET4, T3FREE, THYROIDAB in the last 72 hours. Anemia Panel: No results for input(s): VITAMINB12, FOLATE, FERRITIN, TIBC, IRON, RETICCTPCT in the last 72 hours. Sepsis Labs: Recent Labs  Lab 09/13/19 1536 09/14/19 0112 09/14/19 0342 09/14/19 0408 09/14/19 0847 09/15/19 0612  PROCALCITON  --   --  16.89  --   --  7.43  LATICACIDVEN 2.1* 2.4*  --  2.6* 2.5*  --     Recent Results (from the past 240 hour(s))  Blood culture (routine x 2)     Status: None (Preliminary result)   Collection Time: 09/13/19  1:28 PM   Specimen: BLOOD  Result Value Ref Range Status   Specimen Description BLOOD L WRIST  Final   Special Requests   Final    BOTTLES DRAWN AEROBIC AND ANAEROBIC Blood Culture results may not be optimal due to an excessive volume of blood received in culture bottles   Culture   Final    NO GROWTH 2 DAYS Performed at [facility], [address] [place], [place] [address]    Report Status PENDING  Incomplete  Blood culture (routine x 2)     Status: None (Preliminary result)   Collection Time: 09/13/19  1:28 PM   Specimen: BLOOD  Result Value Ref Range Status   Specimen Description BLOOD L FA  Final   Special Requests   Final    BOTTLES DRAWN AEROBIC AND ANAEROBIC Blood Culture adequate volume   Culture   Final    NO GROWTH 2 DAYS Performed at [facility], [address], [place], [place] [address]    Report Status PENDING  Incomplete  Aerobic/Anaerobic Culture (surgical/deep wound)  Status: None (Preliminary result)   Collection Time: 09/13/19  3:06 PM   Specimen: Wound  Result Value Ref Range Status   Specimen Description   Final    WOUND Performed at [facility], [address], [place], [place] [address]    Special Requests   Final    NONE Performed at [facility], [address] [place], [place] [address]    Gram Stain   Final    MODERATE WBC PRESENT,BOTH PMN AND MONONUCLEAR MODERATE GRAM POSITIVE COCCI IN CHAINS IN PAIRS Performed at [facility], [place] [address] [place], [place] [address]    Culture   Final    MODERATE GROUP A STREP (S.PYOGENES) ISOLATED Beta hemolytic streptococci are predictably susceptible to penicillin and other beta lactams. Susceptibility testing not routinely performed. CULTURE REINCUBATED FOR BETTER GROWTH NO ANAEROBES ISOLATED; CULTURE IN PROGRESS FOR 5 DAYS    Report Status PENDING  Incomplete  SARS Coronavirus 2 by RT PCR (hospital order, performed in [place] hospital lab) Nasopharyngeal Nasopharyngeal Swab     Status: None   Collection Time: 09/13/19  8:21 PM   Specimen: Nasopharyngeal Swab  Result Value Ref Range Status   SARS Coronavirus 2 NEGATIVE NEGATIVE Final    Comment: (NOTE) SARS-CoV-2 target nucleic acids are NOT DETECTED. The SARS-CoV-2 RNA is generally detectable in upper and  lower respiratory specimens during the acute phase of infection. The lowest concentration of SARS-CoV-2 viral copies this assay can detect is 250 copies / mL. A negative result does not preclude SARS-CoV-2 infection and should not be used as the sole basis for treatment or other patient management decisions.  A negative result may occur with improper specimen collection / handling, submission of specimen other than nasopharyngeal swab, presence of viral mutation(s) within the areas targeted by this assay, and inadequate number of viral copies (<250 copies / mL). A negative result must be combined with clinical observations, patient history, and epidemiological information. Fact Sheet for Patients:   StrictlyIdeas.no Fact Sheet for Healthcare Providers: BankingDealers.co.za This test is not yet approved or cleared  by the [place] FDA and has been authorized for detection and/or diagnosis of SARS-CoV-2 by FDA under an Emergency Use Authorization (EUA).  This EUA will remain in effect (meaning this test can be used) for the duration of the COVID-19 declaration under Section 564(b)(1) of the Act, 21 U.S.C. section 360bbb-3(b)(1), unless the authorization is terminated or revoked sooner. Performed at [facility], [address] [place], [place] [address]   MRSA PCR Screening     Status: None   Collection Time: 09/14/19  2:57 AM   Specimen: Nasopharyngeal  Result Value Ref Range Status   MRSA by PCR NEGATIVE NEGATIVE Final    Comment:        The GeneXpert MRSA Assay (FDA approved for NASAL specimens only), is one component of a comprehensive MRSA colonization surveillance program. It is not intended to diagnose MRSA infection nor to guide or monitor treatment for MRSA infections. Performed at [facility], [address], [place],  [address]       Radiology Studies: No results  found.   Scheduled Meds: . Chlorhexidine Gluconate Cloth  6 each Topical Daily  . [START ON 09/16/2019] dexamethasone  6 mg Oral Daily  . enoxaparin (LOVENOX) injection  40 mg Subcutaneous Q24H  . gentamicin ointment   Topical TID  . hydrogen peroxide   Topical TID  . linezolid  600 mg Oral Q12H  .  pantoprazole  40 mg Oral Daily   Continuous Infusions: . sodium chloride Stopped (09/15/19 0615)  . ceFEPime (MAXIPIME) IV 2 g (09/15/19 1325)     LOS: 2 days     Darlin Priestly, MD [facility] If 7PM-7AM, please contact night-coverage 09/15/2019, 3:54 PM

## 2019-09-15 NOTE — Progress Notes (Signed)
PHARMACIST - PHYSICIAN COMMUNICATION  DR:  Darlin Priestly  CONCERNING: Antibiotic IV to Oral Route Change Policy  RECOMMENDATION: This patient is receiving linezolid & metronidazole by the intravenous route.  Based on criteria approved by the Pharmacy and Therapeutics Committee, the antibiotic(s) is/are being converted to the equivalent oral dose form(s).   DESCRIPTION: These criteria include:  Patient being treated for a respiratory tract infection, urinary tract infection, cellulitis or clostridium difficile associated diarrhea if on metronidazole  The patient is not neutropenic and does not exhibit a GI malabsorption state  The patient is eating (either orally or via tube) and/or has been taking other orally administered medications for a least 24 hours  The patient is improving clinically and has a Tmax < 100.5  If you have questions about this conversion, please contact the Pharmacy Department   Dorothea Ogle Pharmacy Resident 15 Sep 2019

## 2019-09-16 LAB — CBC
HCT: 29.5 % — ABNORMAL LOW (ref 39.0–52.0)
Hemoglobin: 10.6 g/dL — ABNORMAL LOW (ref 13.0–17.0)
MCH: 29.9 pg (ref 26.0–34.0)
MCHC: 35.9 g/dL (ref 30.0–36.0)
MCV: 83.3 fL (ref 80.0–100.0)
Platelets: 282 10*3/uL (ref 150–400)
RBC: 3.54 MIL/uL — ABNORMAL LOW (ref 4.22–5.81)
RDW: 12.5 % (ref 11.5–15.5)
WBC: 25.5 10*3/uL — ABNORMAL HIGH (ref 4.0–10.5)
nRBC: 0 % (ref 0.0–0.2)

## 2019-09-16 LAB — BASIC METABOLIC PANEL
Anion gap: 7 (ref 5–15)
BUN: 19 mg/dL (ref 6–20)
CO2: 24 mmol/L (ref 22–32)
Calcium: 8.5 mg/dL — ABNORMAL LOW (ref 8.9–10.3)
Chloride: 108 mmol/L (ref 98–111)
Creatinine, Ser: 0.72 mg/dL (ref 0.61–1.24)
GFR calc Af Amer: >60 mL/min (ref >60)
GFR calc non Af Amer: >60 mL/min (ref >60)
Glucose, Bld: 127 mg/dL — ABNORMAL HIGH (ref 70–99)
Potassium: 4.1 mmol/L (ref 3.5–5.1)
Sodium: 139 mmol/L (ref 135–145)

## 2019-09-16 LAB — MAGNESIUM: Magnesium: 2.1 mg/dL (ref 1.7–2.4)

## 2019-09-16 LAB — PROCALCITONIN: Procalcitonin: 4.95 ng/mL

## 2019-09-16 MED ORDER — SULFAMETHOXAZOLE-TRIMETHOPRIM 800-160 MG PO TABS
1.0000 | ORAL_TABLET | Freq: Two times a day (BID) | ORAL | Status: DC
  Administered 2019-09-16: 1 via ORAL
  Filled 2019-09-16: qty 1

## 2019-09-16 MED ORDER — AMOXICILLIN 500 MG PO CAPS: 500.0000 mg | ORAL_CAPSULE | Freq: Three times a day (TID) | ORAL | 0 refills | Status: AC

## 2019-09-16 MED ORDER — SULFAMETHOXAZOLE-TRIMETHOPRIM 800-160 MG PO TABS: 1.0000 | ORAL_TABLET | Freq: Two times a day (BID) | ORAL | 0 refills | Status: AC

## 2019-09-16 MED ORDER — HYDROCODONE-ACETAMINOPHEN 5-325 MG PO TABS: 1.0000 | ORAL_TABLET | Freq: Four times a day (QID) | ORAL | 0 refills | Status: DC | PRN

## 2019-09-16 MED ORDER — AMOXICILLIN 500 MG PO CAPS
500.0000 mg | ORAL_CAPSULE | Freq: Three times a day (TID) | ORAL | Status: DC
  Administered 2019-09-16: 500 mg via ORAL
  Filled 2019-09-16 (×2): qty 1

## 2019-09-16 NOTE — Discharge Summary (Signed)
Physician Discharge Summary   Philip Owen  male DOB: 12-22-1975  ZSW:[id number]  PCP: Patient, No Pcp Per  Admit date: 09/13/2019 Discharge date: 09/16/2019  Admitted From: home Disposition:  home CODE STATUS: Full code  Discharge Instructions    Diet - low sodium heart healthy   Complete by: As directed    Discharge instructions   Complete by: As directed    You have received 3 days of strong IV antibiotics for your facial cellulitis.  Since it's improving, you can go home to finish taking 7 more days of antibiotics with Amoxil and Bactrim.  Please follow instructions on the prescriptions.   Please follow up with Surgeon Dr. Sandi Mealy to ensure good healing of your nasal fracture.   Dr. Darlin Priestly - -   Increase activity slowly   Complete by: As directed        Hospital Course:  For full details, please see H&P, progress notes, consult notes and ancillary notes.  Briefly,  Philip Jasperson Buntingis a 43 y.o.Caucasian malewith medical history significant forGERDand substance abuse who was brought into the emergency room by his father for evaluation of facial swelling.   Patient stated that he had hit his head on the refrigerator the night prior to his admission and he also stated that he fell off the couch and face planted on the floor. He presented with massive forehead swelling and facial swelling as well as inability to open his eyes. He also complained of severe headache associated with vomiting. Labs reveal a white count of 15.6 with a left shift,lactic acid of 2.0 and serum creatinine of 1.73. He had a CT scan of the head,maxillofacial and C-spine which showed bilateral nasal bone fractures with mild displacement. Extensive soft tissue swelling about the face tracking into the scalp and face with extensive edema, also tracking into the RIGHT upper neck antro laterally.   Sepsisfromfacial cellulitis (POA) Nasal fracture, minimally displaced. Patient  presented with significant facial swelling, redness extending to his neck.  Pt was started empirically on Linezolid, cefepime and flagyl.  ENT and critical care were both consulted and recommended medical treatment with abx.  Flagyl was later d/c'ed since wound cx showed no anaerobes.  ENT re-evaluated pt and "sign off as he is improving and there is no need for surgical intervention."  After 3 days of broad-spectrum abx, pt had significant improvement in facial swelling, so was discharge to finish taking 7 more days of antibiotics with Amoxil and Bactrim.  Pt will follow up with ENT Dr. Sandi Mealy to ensure good healing of nasal fracture.  Acute kidney injury, resolved Etiology unclear and may be related toATN fromsepsis as patient has relative hypotension.   Cr improved with Aggressive IV fluid hydration   GERD Continued PPI   Discharge Diagnoses:  Principal Problem:   Sepsis (HCC) Active Problems:   Facial cellulitis   AKI (acute kidney injury) [facility])    Discharge Instructions:  Allergies as of 09/16/2019      Reactions   Tramadol Itching   Naproxen    Itching       Medication List    TAKE these medications   amoxicillin 500 MG capsule Commonly known as: AMOXIL Take 1 capsule (500 mg total) by mouth every 8 (eight) hours for 7 days. Antibiotic.   HYDROcodone-acetaminophen 5-325 MG tablet Commonly known as: NORCO/VICODIN Take 1-2 tablets by mouth every 6 (six) hours as needed for moderate pain.   NexIUM 24HR 20  MG capsule Generic drug: esomeprazole Take 20 mg by mouth daily at 12 noon.   sulfamethoxazole-trimethoprim 800-160 MG tablet Commonly known as: BACTRIM DS Take 1 tablet by mouth every 12 (twelve) hours for 7 days. Antibiotic.       Follow-up Information    [facility] Follow up.   Specialty: Primary Care Contact information: [address] [place] [address] [phone]       Geanie Logan, MD. Schedule an appointment as soon as possible for a visit in 1 week(s).   Specialty: Otolaryngology Contact information: [address] [place] [address] [phone]           Allergies  Allergen Reactions  . Tramadol Itching  . Naproxen     Itching      The results of significant diagnostics from this hospitalization (including imaging, microbiology, ancillary and laboratory) are listed below for reference.   Consultations:   Procedures/Studies: CT Head Wo Contrast  Result Date: 09/13/2019 CLINICAL DATA:  Hit head on refrigerator. Redness across forehead significant swelling with severe pain and vomiting EXAM: CT HEAD WITHOUT CONTRAST CT MAXILLOFACIAL WITHOUT CONTRAST CT CERVICAL SPINE WITHOUT CONTRAST TECHNIQUE: Multidetector CT imaging of the head, cervical spine, and maxillofacial structures were performed using the standard protocol without intravenous contrast. Multiplanar CT image reconstructions of the cervical spine and maxillofacial structures were also generated. COMPARISON:  CT head from 03/15/2018 FINDINGS: CT HEAD FINDINGS Brain: No evidence of acute infarction, hemorrhage, hydrocephalus, extra-axial collection or mass lesion/mass effect. Signs of white matter disease and atrophy perhaps advanced for age as discussed on prior imaging studies. Vascular: No hyperdense vessel or unexpected calcification. Skull: Signs of facial fractures, see below. Fractures of the nasal bone with mild displacement are noted. Other: Extensive soft tissue swelling overlying the bilateral orbits and frontal area tracking into the RIGHT and LEFT scalp overlying the temporalis musculature bilaterally. CT MAXILLOFACIAL FINDINGS Osseous: Bilateral nasal bone fractures with mild displacement. Zygomatic arches are intact. No signs of mastoid effusion. Pterygoid are intact. Sinuses are clear. No visible mandibular fracture. Orbits: Marked preseptal swelling. No retro  bulbar stranding. Sinuses: Clear. Soft tissues: Extensive soft tissue swelling about the face tracking into the scalp along the RIGHT and LEFT scalp and along the RIGHT and LEFT face with extensive edema in the soft tissues of the scalp and face. Extends posteriorly bilaterally to approximately the parietal region. CT CERVICAL SPINE FINDINGS Alignment: Normal. Skull base and vertebrae: No acute fracture. No primary bone lesion or focal pathologic process. Soft tissues and spinal canal: Stranding extending along the bilateral face along the angle of the mandible bilaterally and into the neck on the RIGHT. Disc levels: Mild degenerative changes throughout the spine greatest at C5-6. Upper chest: Negative. Other: None IMPRESSION: 1. No acute intracranial abnormality. Signs of white matter disease and atrophy perhaps advanced for age, correlate clinically with MRI follow-up on nonemergent basis as clinically warranted. 2. Bilateral nasal bone fractures with mild displacement. 3. Extensive soft tissue swelling about the face tracking into the scalp and face with extensive edema, also tracking into the RIGHT upper neck antro laterally. 4. Given the extensive nature of the edema would also correlate with any signs of cellulitis or infection. 5. No evidence of acute fracture or subluxation of the cervical spine. 6. Mild degenerative changes throughout the spine greatest at C5-6. Electronically Signed   By: Donzetta Kohut M.D.   On: 09/13/2019 13:53  CT Cervical Spine Wo Contrast  Result Date: 09/13/2019 CLINICAL DATA:  Hit head on refrigerator. Redness across forehead significant swelling with severe pain and vomiting EXAM: CT HEAD WITHOUT CONTRAST CT MAXILLOFACIAL WITHOUT CONTRAST CT CERVICAL SPINE WITHOUT CONTRAST TECHNIQUE: Multidetector CT imaging of the head, cervical spine, and maxillofacial structures were performed using the standard protocol without intravenous contrast. Multiplanar CT image reconstructions of  the cervical spine and maxillofacial structures were also generated. COMPARISON:  CT head from 03/15/2018 FINDINGS: CT HEAD FINDINGS Brain: No evidence of acute infarction, hemorrhage, hydrocephalus, extra-axial collection or mass lesion/mass effect. Signs of white matter disease and atrophy perhaps advanced for age as discussed on prior imaging studies. Vascular: No hyperdense vessel or unexpected calcification. Skull: Signs of facial fractures, see below. Fractures of the nasal bone with mild displacement are noted. Other: Extensive soft tissue swelling overlying the bilateral orbits and frontal area tracking into the RIGHT and LEFT scalp overlying the temporalis musculature bilaterally. CT MAXILLOFACIAL FINDINGS Osseous: Bilateral nasal bone fractures with mild displacement. Zygomatic arches are intact. No signs of mastoid effusion. Pterygoid are intact. Sinuses are clear. No visible mandibular fracture. Orbits: Marked preseptal swelling. No retro bulbar stranding. Sinuses: Clear. Soft tissues: Extensive soft tissue swelling about the face tracking into the scalp along the RIGHT and LEFT scalp and along the RIGHT and LEFT face with extensive edema in the soft tissues of the scalp and face. Extends posteriorly bilaterally to approximately the parietal region. CT CERVICAL SPINE FINDINGS Alignment: Normal. Skull base and vertebrae: No acute fracture. No primary bone lesion or focal pathologic process. Soft tissues and spinal canal: Stranding extending along the bilateral face along the angle of the mandible bilaterally and into the neck on the RIGHT. Disc levels: Mild degenerative changes throughout the spine greatest at C5-6. Upper chest: Negative. Other: None IMPRESSION: 1. No acute intracranial abnormality. Signs of white matter disease and atrophy perhaps advanced for age, correlate clinically with MRI follow-up on nonemergent basis as clinically warranted. 2. Bilateral nasal bone fractures with mild  displacement. 3. Extensive soft tissue swelling about the face tracking into the scalp and face with extensive edema, also tracking into the RIGHT upper neck antro laterally. 4. Given the extensive nature of the edema would also correlate with any signs of cellulitis or infection. 5. No evidence of acute fracture or subluxation of the cervical spine. 6. Mild degenerative changes throughout the spine greatest at C5-6. Electronically Signed   By: Donzetta Kohut M.D.   On: 09/13/2019 13:53   CT Maxillofacial Wo Contrast  Result Date: 09/13/2019 CLINICAL DATA:  Hit head on refrigerator. Redness across forehead significant swelling with severe pain and vomiting EXAM: CT HEAD WITHOUT CONTRAST CT MAXILLOFACIAL WITHOUT CONTRAST CT CERVICAL SPINE WITHOUT CONTRAST TECHNIQUE: Multidetector CT imaging of the head, cervical spine, and maxillofacial structures were performed using the standard protocol without intravenous contrast. Multiplanar CT image reconstructions of the cervical spine and maxillofacial structures were also generated. COMPARISON:  CT head from 03/15/2018 FINDINGS: CT HEAD FINDINGS Brain: No evidence of acute infarction, hemorrhage, hydrocephalus, extra-axial collection or mass lesion/mass effect. Signs of white matter disease and atrophy perhaps advanced for age as discussed on prior imaging studies. Vascular: No hyperdense vessel or unexpected calcification. Skull: Signs of facial fractures, see below. Fractures of the nasal bone with mild displacement are noted. Other: Extensive soft tissue swelling overlying the bilateral orbits and frontal area tracking into the RIGHT and LEFT scalp overlying the temporalis musculature bilaterally. CT MAXILLOFACIAL FINDINGS Osseous: Bilateral  nasal bone fractures with mild displacement. Zygomatic arches are intact. No signs of mastoid effusion. Pterygoid are intact. Sinuses are clear. No visible mandibular fracture. Orbits: Marked preseptal swelling. No retro bulbar  stranding. Sinuses: Clear. Soft tissues: Extensive soft tissue swelling about the face tracking into the scalp along the RIGHT and LEFT scalp and along the RIGHT and LEFT face with extensive edema in the soft tissues of the scalp and face. Extends posteriorly bilaterally to approximately the parietal region. CT CERVICAL SPINE FINDINGS Alignment: Normal. Skull base and vertebrae: No acute fracture. No primary bone lesion or focal pathologic process. Soft tissues and spinal canal: Stranding extending along the bilateral face along the angle of the mandible bilaterally and into the neck on the RIGHT. Disc levels: Mild degenerative changes throughout the spine greatest at C5-6. Upper chest: Negative. Other: None IMPRESSION: 1. No acute intracranial abnormality. Signs of white matter disease and atrophy perhaps advanced for age, correlate clinically with MRI follow-up on nonemergent basis as clinically warranted. 2. Bilateral nasal bone fractures with mild displacement. 3. Extensive soft tissue swelling about the face tracking into the scalp and face with extensive edema, also tracking into the RIGHT upper neck antro laterally. 4. Given the extensive nature of the edema would also correlate with any signs of cellulitis or infection. 5. No evidence of acute fracture or subluxation of the cervical spine. 6. Mild degenerative changes throughout the spine greatest at C5-6. Electronically Signed   By: Donzetta Kohut M.D.   On: 09/13/2019 13:53      Labs: BNP (last 3 results) No results for input(s): BNP in the last 8760 hours. Basic Metabolic Panel: Recent Labs  Lab 09/13/19 1328 09/14/19 0342 09/15/19 0612 09/16/19 0421  NA 130* 133* 137 139  K 4.0 4.4 4.8 4.1  CL 96* 104 108 108  CO2 22 21* 24 24  GLUCOSE 94 91 141* 127*  BUN 31* 25* 16 19  CREATININE 1.73* 1.07 0.63 0.72  CALCIUM 8.4* 7.3* 8.3* 8.5*  MG  --  1.2* 2.8* 2.1  PHOS  --  3.1 1.4*  --    Liver Function Tests: Recent Labs  Lab  09/13/19 1328 09/15/19 0612  AST 26 32  ALT 26 70*  ALKPHOS 48 49  BILITOT 2.2* 0.7  PROT 6.5 5.7*  ALBUMIN 3.7 2.8*   No results for input(s): LIPASE, AMYLASE in the last 168 hours. No results for input(s): AMMONIA in the last 168 hours. CBC: Recent Labs  Lab 09/13/19 1328 09/14/19 0342 09/15/19 0612 09/16/19 0421  WBC 15.6* 21.0* 25.4* 25.5*  NEUTROABS 14.3*  --  22.3*  --   HGB 14.2 11.1* 11.5* 10.6*  HCT 40.7 31.0* 32.7* 29.5*  MCV 84.6 82.4 82.4 83.3  PLT 235 171 231 282   Cardiac Enzymes: Recent Labs  Lab 09/14/19 0112  CKTOTAL 115   BNP: Invalid input(s): POCBNP CBG: Recent Labs  Lab 09/14/19 0254  GLUCAP 101*   D-Dimer No results for input(s): DDIMER in the last 72 hours. Hgb A1c No results for input(s): HGBA1C in the last 72 hours. Lipid Profile No results for input(s): CHOL, HDL, LDLCALC, TRIG, CHOLHDL, LDLDIRECT in the last 72 hours. Thyroid function studies No results for input(s): TSH, T4TOTAL, T3FREE, THYROIDAB in the last 72 hours.  Invalid input(s): FREET3 Anemia work up No results for input(s): VITAMINB12, FOLATE, FERRITIN, TIBC, IRON, RETICCTPCT in the last 72 hours. Urinalysis    Component Value Date/Time   COLORURINE AMBER (A) 09/13/2019 1536  APPEARANCEUR CLOUDY (A) 09/13/2019 1536   APPEARANCEUR Clear 12/13/2012 0549   LABSPEC 1.031 (H) 09/13/2019 1536   LABSPEC 1.025 12/13/2012 0549   PHURINE 5.0 09/13/2019 1536   GLUCOSEU NEGATIVE 09/13/2019 1536   GLUCOSEU Negative 12/13/2012 0549   HGBUR NEGATIVE 09/13/2019 1536   BILIRUBINUR NEGATIVE 09/13/2019 1536   BILIRUBINUR Negative 12/13/2012 0549   KETONESUR NEGATIVE 09/13/2019 1536   PROTEINUR 30 (A) 09/13/2019 1536   NITRITE NEGATIVE 09/13/2019 1536   LEUKOCYTESUR TRACE (A) 09/13/2019 1536   LEUKOCYTESUR Negative 12/13/2012 0549   Sepsis Labs Invalid input(s): PROCALCITONIN,  WBC,  LACTICIDVEN Microbiology Recent Results (from the past 240 hour(s))  Blood culture  (routine x 2)     Status: None (Preliminary result)   Collection Time: 09/13/19  1:28 PM   Specimen: BLOOD  Result Value Ref Range Status   Specimen Description BLOOD L WRIST  Final   Special Requests   Final    BOTTLES DRAWN AEROBIC AND ANAEROBIC Blood Culture results may not be optimal due to an excessive volume of blood received in culture bottles   Culture   Final    NO GROWTH 3 DAYS Performed at [facility], [address] [place], [place] [address]    Report Status PENDING  Incomplete  Blood culture (routine x 2)     Status: None (Preliminary result)   Collection Time: 09/13/19  1:28 PM   Specimen: BLOOD  Result Value Ref Range Status   Specimen Description BLOOD L FA  Final   Special Requests   Final    BOTTLES DRAWN AEROBIC AND ANAEROBIC Blood Culture adequate volume   Culture   Final    NO GROWTH 3 DAYS Performed at [facility], [address] [place], [place] [address]    Report Status PENDING  Incomplete  Aerobic/Anaerobic Culture (surgical/deep wound)     Status: None (Preliminary result)   Collection Time: 09/13/19  3:06 PM   Specimen: Wound  Result Value Ref Range Status   Specimen Description   Final    WOUND Performed at [facility], [address] [place], [place] [address]    Special Requests   Final    NONE Performed at [facility], [address] [place], [place] [address]    Gram Stain   Final    MODERATE WBC PRESENT,BOTH PMN AND MONONUCLEAR MODERATE GRAM POSITIVE COCCI IN CHAINS IN PAIRS Performed at [facility], [address] [place], [place] [address]    Culture   Final    MODERATE GROUP A STREP (S.PYOGENES) ISOLATED Beta hemolytic streptococci are predictably susceptible to penicillin and other beta lactams. Susceptibility testing not routinely performed. FEW STAPHYLOCOCCUS AUREUS SUSCEPTIBILITIES TO FOLLOW NO ANAEROBES ISOLATED; CULTURE IN PROGRESS FOR 5 DAYS    Report Status PENDING   Incomplete  SARS Coronavirus 2 by RT PCR (hospital order, performed in [facility] hospital lab) Nasopharyngeal Nasopharyngeal Swab     Status: None   Collection Time: 09/13/19  8:21 PM   Specimen: Nasopharyngeal Swab  Result Value Ref Range Status   SARS Coronavirus 2 NEGATIVE NEGATIVE Final    Comment: (NOTE) SARS-CoV-2 target nucleic acids are NOT DETECTED. The SARS-CoV-2 RNA is generally detectable in upper and lower respiratory specimens during the acute phase of infection. The lowest concentration of SARS-CoV-2 viral copies this assay can detect is 250 copies / mL. A negative result does not preclude SARS-CoV-2 infection and should not be used as the sole basis for  treatment or other patient management decisions.  A negative result may occur with improper specimen collection / handling, submission of specimen other than nasopharyngeal swab, presence of viral mutation(s) within the areas targeted by this assay, and inadequate number of viral copies (<250 copies / mL). A negative result must be combined with clinical observations, patient history, and epidemiological information. Fact Sheet for Patients:   StrictlyIdeas.no Fact Sheet for Healthcare Providers: BankingDealers.co.za This test is not yet approved or cleared  by the [place] FDA and has been authorized for detection and/or diagnosis of SARS-CoV-2 by FDA under an Emergency Use Authorization (EUA).  This EUA will remain in effect (meaning this test can be used) for the duration of the COVID-19 declaration under Section 564(b)(1) of the Act, 21 U.S.C. section 360bbb-3(b)(1), unless the authorization is terminated or revoked sooner. Performed at [facility], [address] [place], [place] [address]   MRSA PCR Screening     Status: None   Collection Time: 09/14/19  2:57 AM   Specimen: Nasopharyngeal  Result Value Ref Range Status   MRSA by PCR NEGATIVE  NEGATIVE Final    Comment:        The GeneXpert MRSA Assay (FDA approved for NASAL specimens only), is one component of a comprehensive MRSA colonization surveillance program. It is not intended to diagnose MRSA infection nor to guide or monitor treatment for MRSA infections. Performed at [facility], [address] [place], [place] [address]      Total time spend on discharging this patient, including the last patient exam, discussing the hospital stay, instructions for ongoing care as it relates to all pertinent caregivers, as well as preparing the medical discharge records, prescriptions, and/or referrals as applicable, is 35 minutes.    Enzo Bi, MD  [facility] 09/16/2019, 12:08 PM  If 7PM-7AM, please contact night-coverage

## 2019-09-16 NOTE — Progress Notes (Signed)
D: Pt alert and oriented x 4. Pt denies experiencing any pain at this time.  A: Pt received discharge and medication education/information. Pt belongings were gathered and taken with pt upon discharge.   R: Pt verbalized understanding of discharge and medication education/information.  Pt escorted to medical mall front lobby by staff where pt was picked up by mother.

## 2019-09-16 NOTE — TOC Transition Note (Signed)
Transition of Care [facility]) - CM/SW Discharge Note   Patient Details  Name: SAL SPRATLEY MRN: [id number] Date of Birth: 07-Apr-1976  [facility] CM/SW Contact:  Lucy Chris, LCSW Phone Number: 09/16/2019, 12:27 PM   Clinical Narrative:   Referral made to [facility] and [facility] for assist with medications. Pt given paperwork for both of these agencies. He is independent and has no other needs. Lives with parents and they assist him financially      Barriers to Discharge: Inadequate or no insurance   Patient Goals and CMS Choice        Discharge Placement                       Discharge Plan and Services In-house Referral: Clinical Social Work Discharge Planning Services: Indigent Health Clinic, Medication Assistance                                 Social Determinants of Health (SDOH) Interventions     Readmission Risk Interventions No flowsheet data found.

## 2019-09-18 ENCOUNTER — Other Ambulatory Visit: Payer: Self-pay

## 2019-09-18 ENCOUNTER — Emergency Department
Admission: EM | Admit: 2019-09-18 | Discharge: 2019-09-18 | Disposition: A | Discharge status: Home / Self Care | Payer: Self-pay | Attending: Emergency Medicine | Admitting: Emergency Medicine

## 2019-09-18 ENCOUNTER — Telehealth: Payer: Self-pay | Admitting: General Practice

## 2019-09-18 ENCOUNTER — Encounter: Payer: Self-pay | Admitting: Emergency Medicine

## 2019-09-18 ENCOUNTER — Emergency Department: Payer: Self-pay

## 2019-09-18 DIAGNOSIS — L03211 Cellulitis of face: Secondary | ICD-10-CM | POA: Insufficient documentation

## 2019-09-18 LAB — URINALYSIS, COMPLETE (UACMP) WITH MICROSCOPIC
Bacteria, UA: NONE SEEN
Bilirubin Urine: NEGATIVE
Glucose, UA: NEGATIVE mg/dL
Hgb urine dipstick: NEGATIVE
Ketones, ur: NEGATIVE mg/dL
Leukocytes,Ua: NEGATIVE
Nitrite: NEGATIVE
Protein, ur: NEGATIVE mg/dL
Specific Gravity, Urine: 1.003 — ABNORMAL LOW (ref 1.005–1.030)
Squamous Epithelial / HPF: NONE SEEN (ref 0–5)
pH: 7 (ref 5.0–8.0)

## 2019-09-18 LAB — CBC WITH DIFFERENTIAL/PLATELET
Abs Immature Granulocytes: 3.08 10*3/uL — ABNORMAL HIGH (ref 0.00–0.07)
Basophils Absolute: 0.1 10*3/uL (ref 0.0–0.1)
Basophils Relative: 1 %
Eosinophils Absolute: 0.3 10*3/uL (ref 0.0–0.5)
Eosinophils Relative: 3 %
HCT: 37.9 % — ABNORMAL LOW (ref 39.0–52.0)
Hemoglobin: 13.1 g/dL (ref 13.0–17.0)
Immature Granulocytes: 24 %
Lymphocytes Relative: 12 %
Lymphs Abs: 1.6 10*3/uL (ref 0.7–4.0)
MCH: 29.4 pg (ref 26.0–34.0)
MCHC: 34.6 g/dL (ref 30.0–36.0)
MCV: 85 fL (ref 80.0–100.0)
Monocytes Absolute: 1.3 10*3/uL — ABNORMAL HIGH (ref 0.1–1.0)
Monocytes Relative: 10 %
Neutro Abs: 6.5 10*3/uL (ref 1.7–7.7)
Neutrophils Relative %: 50 %
Platelets: 251 10*3/uL (ref 150–400)
RBC: 4.46 MIL/uL (ref 4.22–5.81)
RDW: 12.6 % (ref 11.5–15.5)
Smear Review: NORMAL
WBC: 13 10*3/uL — ABNORMAL HIGH (ref 4.0–10.5)
nRBC: 0 % (ref 0.0–0.2)

## 2019-09-18 LAB — URINE DRUG SCREEN, QUALITATIVE (ARMC ONLY)
Amphetamines, Ur Screen: NOT DETECTED
Barbiturates, Ur Screen: NOT DETECTED
Benzodiazepine, Ur Scrn: NOT DETECTED
Cannabinoid 50 Ng, Ur ~~LOC~~: NOT DETECTED
Cocaine Metabolite,Ur ~~LOC~~: NOT DETECTED
MDMA (Ecstasy)Ur Screen: NOT DETECTED
Methadone Scn, Ur: NOT DETECTED
Opiate, Ur Screen: POSITIVE — AB
Phencyclidine (PCP) Ur S: NOT DETECTED
Tricyclic, Ur Screen: NOT DETECTED

## 2019-09-18 LAB — CULTURE, BLOOD (ROUTINE X 2)
Culture: NO GROWTH
Culture: NO GROWTH
Special Requests: ADEQUATE

## 2019-09-18 LAB — AEROBIC/ANAEROBIC CULTURE W GRAM STAIN (SURGICAL/DEEP WOUND)

## 2019-09-18 LAB — COMPREHENSIVE METABOLIC PANEL
ALT: 61 U/L — ABNORMAL HIGH (ref 0–44)
AST: 41 U/L (ref 15–41)
Albumin: 3.4 g/dL — ABNORMAL LOW (ref 3.5–5.0)
Alkaline Phosphatase: 88 U/L (ref 38–126)
Anion gap: 10 (ref 5–15)
BUN: 15 mg/dL (ref 6–20)
CO2: 24 mmol/L (ref 22–32)
Calcium: 8.5 mg/dL — ABNORMAL LOW (ref 8.9–10.3)
Chloride: 100 mmol/L (ref 98–111)
Creatinine, Ser: 0.58 mg/dL — ABNORMAL LOW (ref 0.61–1.24)
GFR calc Af Amer: >60 mL/min (ref >60)
GFR calc non Af Amer: >60 mL/min (ref >60)
Glucose, Bld: 116 mg/dL — ABNORMAL HIGH (ref 70–99)
Potassium: 3.6 mmol/L (ref 3.5–5.1)
Sodium: 134 mmol/L — ABNORMAL LOW (ref 135–145)
Total Bilirubin: 0.4 mg/dL (ref 0.3–1.2)
Total Protein: 6.5 g/dL (ref 6.5–8.1)

## 2019-09-18 LAB — LACTIC ACID, PLASMA
Lactic Acid, Venous: 2.5 mmol/L (ref 0.5–1.9)
Lactic Acid, Venous: 2.1 mmol/L (ref 0.5–1.9)

## 2019-09-18 LAB — ETHANOL: Alcohol, Ethyl (B): <10 mg/dL (ref <10)

## 2019-09-18 MED ORDER — SODIUM CHLORIDE 0.9 % IV SOLN: Freq: Once | INTRAVENOUS | Status: AC

## 2019-09-18 MED ORDER — DIPHENHYDRAMINE HCL 25 MG PO CAPS
25.0000 mg | ORAL_CAPSULE | Freq: Four times a day (QID) | ORAL | 0 refills | Status: DC | PRN
Start: 2019-09-18 — End: 2019-10-25

## 2019-09-18 MED ORDER — DIPHENHYDRAMINE HCL 50 MG/ML IJ SOLN
25.0000 mg | Freq: Once | INTRAMUSCULAR | Status: AC
  Administered 2019-09-18: 25 mg via INTRAVENOUS
  Filled 2019-09-18: qty 1

## 2019-09-18 MED ORDER — CEPHALEXIN 500 MG PO CAPS: 500.0000 mg | ORAL_CAPSULE | Freq: Four times a day (QID) | ORAL | 0 refills | Status: AC

## 2019-09-18 MED ORDER — OXYCODONE-ACETAMINOPHEN 5-325 MG PO TABS: 1.0000 | ORAL_TABLET | Freq: Three times a day (TID) | ORAL | 0 refills | Status: DC | PRN

## 2019-09-18 MED ORDER — PREDNISONE 10 MG (21) PO TBPK
ORAL_TABLET | ORAL | 0 refills | Status: DC
Start: 2019-09-18 — End: 2019-10-25

## 2019-09-18 MED ORDER — IOHEXOL 300 MG/ML  SOLN
75.0000 mL | Freq: Once | INTRAMUSCULAR | Status: AC | PRN
  Administered 2019-09-18: 75 mL via INTRAVENOUS

## 2019-09-18 MED ORDER — DEXAMETHASONE SODIUM PHOSPHATE 10 MG/ML IJ SOLN
10.0000 mg | Freq: Once | INTRAMUSCULAR | Status: AC
  Administered 2019-09-18: 10 mg via INTRAVENOUS
  Filled 2019-09-18: qty 1

## 2019-09-18 MED ORDER — VANCOMYCIN HCL IN DEXTROSE 1-5 GM/200ML-% IV SOLN
1000.0000 mg | Freq: Once | INTRAVENOUS | Status: AC
  Administered 2019-09-18: 1000 mg via INTRAVENOUS
  Filled 2019-09-18: qty 200

## 2019-09-18 NOTE — ED Triage Notes (Signed)
Pt was here last week and admitted for sepsis r/t severe facial swelling over forehead and temporal area. Discharged Monday.  On abx. On abx, woke up this AM and all swelling came back per pt.  Low grade temp.  Redness to forehead.

## 2019-09-18 NOTE — ED Notes (Signed)
Date and time results received: 05/19/210935  (use smartphrase ".now" to insert current time)  Test: lactic Critical Value: 2.5  Name of Provider Notified: Mayford Knife  Orders Received? Or Actions Taken?: Orders Received - See Orders for details

## 2019-09-18 NOTE — Telephone Encounter (Signed)
Called pt to go over eligibility and required paperwork to be a new pt. Call went straight to vm but I was able to leave a message and a return call phone number

## 2019-09-18 NOTE — ED Provider Notes (Signed)
[facility] Provider Note       Time seen: ----------------------------------------- 8:53 AM on 09/18/2019 -----------------------------------------   I have reviewed the vital signs and the nursing notes.  HISTORY   Chief Complaint Cellulitis   HPI Philip Owen is a 44 y.o. male with a history of GERD, anxiety who presents today for recurrent facial swelling.  Patient was seen in the ER and admitted into the hospital for sepsis after a head injury.  He was just discharged on Monday.  States the swelling was going down and this morning it started to come back.  He states his eyes were swollen to the point where he has a hard time opening them again.  Discomfort is 8 out of 10 in his face.  Past Medical History:  Diagnosis Date  . Acid reflux   . Anxiety   . GERD (gastroesophageal reflux disease)     Past Surgical History:  Procedure Laterality Date  . none      Allergies Tramadol and Naproxen   Review of Systems Constitutional: Negative for fever. HEENT exam: Positive for facial swelling Cardiovascular: Negative for chest pain. Respiratory: Negative for shortness of breath. Gastrointestinal: Negative for abdominal pain, vomiting and diarrhea. Musculoskeletal: Negative for back pain. Skin: Positive for facial redness Neurological: Negative for headaches, focal weakness or numbness.  All systems negative/normal/unremarkable except as stated in the HPI  ____________________________________________   PHYSICAL EXAM:  VITAL SIGNS: ED Triage Vitals  Enc Vitals Group     BP 09/18/19 0849 (!) 170/101     Pulse Rate 09/18/19 0849 (!) 108     Resp 09/18/19 0849 18     Temp 09/18/19 0849 99.6 F (37.6 C)     Temp Source 09/18/19 0849 Oral     SpO2 09/18/19 0849 100 %     Weight 09/18/19 0852 147 lb 4.3 oz (66.8 kg)     Height 09/18/19 0852 5\' 8"  (1.727 m)     Head Circumference --      Peak Flow --      Pain Score 09/18/19 0850  8     Pain Loc --      Pain Edu? --      Excl. in [place]? --     Constitutional: Alert and oriented. Well appearing and in no distress. Eyes: Bilateral periorbital swelling and lid edema ENT      Head: Diffuse upper facial swelling, essentially from eyelids to frontal scalp above the hairline.  There is some frontal scalp erythema and diffuse edema      Nose: No congestion/rhinnorhea.      Mouth/Throat: Mucous membranes are moist.      Neck: No stridor. Cardiovascular: Normal rate, regular rhythm. No murmurs, rubs, or gallops. Respiratory: Normal respiratory effort without tachypnea nor retractions. Breath sounds are clear and equal bilaterally. No wheezes/rales/rhonchi. Gastrointestinal: Soft and nontender. Normal bowel sounds Musculoskeletal: Nontender with normal range of motion in extremities. No lower extremity tenderness nor edema. Neurologic:  Normal speech and language. No gross focal neurologic deficits are appreciated.  Skin:  Skin is warm, dry and intact. No rash noted. Psychiatric: Speech and behavior are normal.  ____________________________________________   LABS (pertinent positives/negatives)  Labs Reviewed  LACTIC ACID, PLASMA - Abnormal; Notable for the following components:      Result Value   Lactic Acid, Venous 2.5 (*)    All other components within normal limits  LACTIC ACID, PLASMA - Abnormal; Notable for the following components:   Lactic  Acid, Venous 2.1 (*)    All other components within normal limits  COMPREHENSIVE METABOLIC PANEL - Abnormal; Notable for the following components:   Sodium 134 (*)    Glucose, Bld 116 (*)    Creatinine, Ser 0.58 (*)    Calcium 8.5 (*)    Albumin 3.4 (*)    ALT 61 (*)    All other components within normal limits  CBC WITH DIFFERENTIAL/PLATELET - Abnormal; Notable for the following components:   WBC 13.0 (*)    HCT 37.9 (*)    Monocytes Absolute 1.3 (*)    Abs Immature Granulocytes 3.08 (*)    All other components  within normal limits  URINALYSIS, COMPLETE (UACMP) WITH MICROSCOPIC - Abnormal; Notable for the following components:   Color, Urine COLORLESS (*)    APPearance CLEAR (*)    Specific Gravity, Urine 1.003 (*)    All other components within normal limits  URINE DRUG SCREEN, QUALITATIVE ([facility] ONLY) - Abnormal; Notable for the following components:   Opiate, Ur Screen POSITIVE (*)    All other components within normal limits  CULTURE, BLOOD (ROUTINE X 2)  CULTURE, BLOOD (ROUTINE X 2)  ETHANOL    RADIOLOGY CT maxillofacial Images were viewed by me IMPRESSION: 1. Persistent and extensive soft tissue swelling in the frontal and temporal scalp extending into the face bilaterally. There is extensive soft tissue swelling in the eyelid bilaterally. Swelling is similar to the prior study. There is prominent enhancing vascularity within the scalp soft tissue swelling. Large area of nonenhancing soft tissue in the frontal scalp bilaterally similar to the prior study. Possible tissue edema versus abscess. Consider aspiration. This was discussed with Dr. Mayford Knife. 2. There is reactive adenopathy in the neck bilaterally. 3. No orbital mass or abscess identified. 4. Persistent soft tissue swelling may be due to persistent infection however given the extensive nature and relative symmetry, also consider angioedema. 5. Nasal bone fracture unchanged from the recent CT. Possible chronic fracture. 6. These results were called by telephone at the time of interpretation on 09/18/2019 at 12:27 pm to provider Daryel November , who verbally acknowledged these results.  DIFFERENTIAL DIAGNOSIS  Cellulitis, angioedema, sepsis, shingles,  ASSESSMENT AND PLAN  Facial swelling   Plan: The patient had presented for recurrent facial swelling which is either cellulitis and or angioedema. Patient's labs overall appear to be improved compared to prior.  Lactate was trending downward. Patient's imaging  revealed persistent edema of uncertain etiology.  Overall this is improved somewhat from his recent visit.  Unclear etiology as to why it would be persistent.  Patient denies any recent trauma.  We have given additional vancomycin and Decadron.  I have discussed with infectious disease, we will place on Keflex as well as steroids, Benadryl and pain medicine.  Will advise follow-up in 2 days for recheck.  Daryel November MD    Note: This note was generated in part or whole with voice recognition software. Voice recognition is usually quite accurate but there are transcription errors that can and very often do occur. I apologize for any typographical errors that were not detected and corrected.     Emily Filbert, MD 09/18/19 [phone]

## 2019-09-18 NOTE — ED Notes (Signed)
Patient discharged home with mother, patient received discharge papers and prescription. Patient appropriate and cooperative, Vital signs taken. NAD noted. 

## 2019-09-23 LAB — CULTURE, BLOOD (ROUTINE X 2)
Culture: NO GROWTH
Culture: NO GROWTH
Special Requests: ADEQUATE
Special Requests: ADEQUATE

## 2019-09-24 ENCOUNTER — Telehealth: Payer: Self-pay | Admitting: General Practice

## 2019-09-24 NOTE — Telephone Encounter (Signed)
Left a message regarding general information of the clinic.

## 2019-09-25 ENCOUNTER — Telehealth: Payer: Self-pay | Admitting: General Practice

## 2019-09-25 NOTE — Telephone Encounter (Signed)
Reached possible patient and discussed eligibility.

## 2019-10-02 ENCOUNTER — Encounter: Payer: Self-pay | Admitting: Emergency Medicine

## 2019-10-02 ENCOUNTER — Emergency Department: Payer: Medicaid Other

## 2019-10-02 DIAGNOSIS — L02811 Cutaneous abscess of head [any part, except face]: Secondary | ICD-10-CM | POA: Insufficient documentation

## 2019-10-02 LAB — CBC WITH DIFFERENTIAL/PLATELET
Abs Immature Granulocytes: 0.08 10*3/uL — ABNORMAL HIGH (ref 0.00–0.07)
Basophils Absolute: 0.1 10*3/uL (ref 0.0–0.1)
Basophils Relative: 1 %
Eosinophils Absolute: 0.3 10*3/uL (ref 0.0–0.5)
Eosinophils Relative: 3 %
HCT: 37.7 % — ABNORMAL LOW (ref 39.0–52.0)
Hemoglobin: 12.7 g/dL — ABNORMAL LOW (ref 13.0–17.0)
Immature Granulocytes: 1 %
Lymphocytes Relative: 34 %
Lymphs Abs: 3.3 10*3/uL (ref 0.7–4.0)
MCH: 29.1 pg (ref 26.0–34.0)
MCHC: 33.7 g/dL (ref 30.0–36.0)
MCV: 86.3 fL (ref 80.0–100.0)
Monocytes Absolute: 0.5 10*3/uL (ref 0.1–1.0)
Monocytes Relative: 6 %
Neutro Abs: 5.4 10*3/uL (ref 1.7–7.7)
Neutrophils Relative %: 55 %
Platelets: 387 10*3/uL (ref 150–400)
RBC: 4.37 MIL/uL (ref 4.22–5.81)
RDW: 13.4 % (ref 11.5–15.5)
WBC: 9.7 10*3/uL (ref 4.0–10.5)
nRBC: 0 % (ref 0.0–0.2)

## 2019-10-02 LAB — COMPREHENSIVE METABOLIC PANEL
ALT: 37 U/L (ref 0–44)
AST: 20 U/L (ref 15–41)
Albumin: 3.8 g/dL (ref 3.5–5.0)
Alkaline Phosphatase: 75 U/L (ref 38–126)
Anion gap: 11 (ref 5–15)
BUN: 14 mg/dL (ref 6–20)
CO2: 28 mmol/L (ref 22–32)
Calcium: 8.8 mg/dL — ABNORMAL LOW (ref 8.9–10.3)
Chloride: 98 mmol/L (ref 98–111)
Creatinine, Ser: 0.82 mg/dL (ref 0.61–1.24)
GFR calc Af Amer: >60 mL/min (ref >60)
GFR calc non Af Amer: >60 mL/min (ref >60)
Glucose, Bld: 112 mg/dL — ABNORMAL HIGH (ref 70–99)
Potassium: 3.4 mmol/L — ABNORMAL LOW (ref 3.5–5.1)
Sodium: 137 mmol/L (ref 135–145)
Total Bilirubin: 0.9 mg/dL (ref 0.3–1.2)
Total Protein: 7.2 g/dL (ref 6.5–8.1)

## 2019-10-02 LAB — LACTIC ACID, PLASMA: Lactic Acid, Venous: 0.7 mmol/L (ref 0.5–1.9)

## 2019-10-02 MED ORDER — IOHEXOL 300 MG/ML  SOLN
75.0000 mL | Freq: Once | INTRAMUSCULAR | Status: AC | PRN
  Administered 2019-10-02: 75 mL via INTRAVENOUS

## 2019-10-02 NOTE — ED Triage Notes (Signed)
Pt reports he cut his head 1 week ago and has had swelling, redness and pain from the top of head down into the right side of neck. Pt denies fever.

## 2019-10-03 ENCOUNTER — Emergency Department
Admission: EM | Admit: 2019-10-03 | Discharge: 2019-10-03 | Disposition: A | Discharge status: Home / Self Care | Payer: Medicaid Other | Attending: Emergency Medicine | Admitting: Emergency Medicine

## 2019-10-03 DIAGNOSIS — L0291 Cutaneous abscess, unspecified: Secondary | ICD-10-CM

## 2019-10-03 MED ORDER — OXYCODONE-ACETAMINOPHEN 5-325 MG PO TABS: 2.0000 | ORAL_TABLET | Freq: Once | ORAL | Status: DC

## 2019-10-03 MED ORDER — OXYCODONE-ACETAMINOPHEN 5-325 MG PO TABS: 1.0000 | ORAL_TABLET | Freq: Two times a day (BID) | ORAL | 0 refills | Status: DC | PRN

## 2019-10-03 MED ORDER — SULFAMETHOXAZOLE-TRIMETHOPRIM 800-160 MG PO TABS
1.0000 | ORAL_TABLET | Freq: Two times a day (BID) | ORAL | 0 refills | Status: DC
Start: 2019-10-03 — End: 2019-10-25

## 2019-10-03 MED ORDER — LIDOCAINE-EPINEPHRINE 1 %-1:100000 IJ SOLN
10.0000 mL | Freq: Once | INTRAMUSCULAR | Status: DC
  Filled 2019-10-03: qty 10

## 2019-10-03 NOTE — ED Notes (Signed)
AAOx3.  Skin warm and dry.  NAD 

## 2019-10-03 NOTE — ED Provider Notes (Signed)
ER Provider Note       Time seen: 7:14 AM    I have reviewed the vital signs and the nursing notes.  HISTORY   Chief Complaint Wound Infection    HPI Philip Owen is a 44 y.o. male with a history of GERD, anxiety, recent facial infections who presents today for swelling, redness and pain from the top of his head down to the right side of his neck.  He denies any fever.  Has been seen recently for same by me.  Past Medical History:  Diagnosis Date  . Acid reflux   . Anxiety   . GERD (gastroesophageal reflux disease)     Past Surgical History:  Procedure Laterality Date  . none      Allergies Tramadol and Naproxen  Review of Systems Constitutional: Negative for fever. Cardiovascular: Negative for chest pain. Respiratory: Negative for shortness of breath. Gastrointestinal: Negative for abdominal pain, vomiting and diarrhea. Musculoskeletal: Negative for back pain. Skin: Positive for redness and swelling Neurological: Positive for headache, redness and facial pain  All systems negative/normal/unremarkable except as stated in the HPI  ____________________________________________   PHYSICAL EXAM:  VITAL SIGNS: Vitals:   10/02/19 1911 10/03/19 0033  BP: (!) 144/91 (!) 162/90  Pulse: 89 88  Resp: 18 18  Temp: 99.1 F (37.3 C)   SpO2: 100% 100%    Constitutional: Alert and oriented. Well appearing and in no distress. Eyes: Conjunctivae are normal. Normal extraocular movements. ENT      Head: Normocephalic, there is frontal scalp swelling and erythema with tenderness      Nose: No congestion/rhinnorhea.      Mouth/Throat: Mucous membranes are moist.      Neck: No stridor. Cardiovascular: Normal rate, regular rhythm. No murmurs, rubs, or gallops. Respiratory: Normal respiratory effort without tachypnea nor retractions. Breath sounds are clear and equal bilaterally. No wheezes/rales/rhonchi. Gastrointestinal: Soft and nontender. Normal bowel  sounds Musculoskeletal: Nontender with normal range of motion in extremities. No lower extremity tenderness nor edema. Neurologic:  Normal speech and language. No gross focal neurologic deficits are appreciated.  Skin:  Skin is warm, dry and intact. No rash noted. Psychiatric: Speech and behavior are normal.  ____________________________________________   LABS (pertinent positives/negatives)  Labs Reviewed  COMPREHENSIVE METABOLIC PANEL - Abnormal; Notable for the following components:      Result Value   Potassium 3.4 (*)    Glucose, Bld 112 (*)    Calcium 8.8 (*)    All other components within normal limits  CBC WITH DIFFERENTIAL/PLATELET - Abnormal; Notable for the following components:   Hemoglobin 12.7 (*)    HCT 37.7 (*)    Abs Immature Granulocytes 0.08 (*)    All other components within normal limits  CULTURE, BLOOD (ROUTINE X 2)  CULTURE, BLOOD (ROUTINE X 2)  AEROBIC/ANAEROBIC CULTURE (SURGICAL/DEEP WOUND)  LACTIC ACID, PLASMA   .Marland KitchenIncision and Drainage  Date/Time: 10/03/2019 7:41 AM Performed by: Earleen Newport, MD Authorized by: Earleen Newport, MD   Consent:    Consent obtained:  Verbal   Consent given by:  Patient Location:    Type:  Abscess   Location:  Head Pre-procedure details:    Skin preparation:  Betadine Anesthesia (see MAR for exact dosages):    Anesthesia method:  Local infiltration   Local anesthetic:  Lidocaine 1% WITH epi Procedure type:    Complexity:  Simple Procedure details:    Incision types:  Single straight   Scalpel blade:  11  Wound management:  Probed and deloculated   Drainage:  Purulent   Drainage amount:  Moderate   Wound treatment:  Drain placed   Packing materials:  1/4 in gauze Post-procedure details:    Patient tolerance of procedure:  Tolerated well, no immediate complications    RADIOLOGY  Images were viewed by me CT head/maxillofacial IMPRESSION: Persistent extensive soft tissue swelling in the  bifrontal and temporal scalp extending into the face. Enhancement at the periphery of the low-density along the frontal scalp suggests presence of abscess formation. No post-septal intraorbital extension.  No acute intracranial abnormality  DIFFERENTIAL DIAGNOSIS  Hematoma, abscess, cellulitis  ASSESSMENT AND PLAN  Facial abscess, incision and drainage   Plan: The patient had presented for recurrent facial infection that likely originated from hematoma and cellulitis which is now transformed into frontal scalp abscess. Patient's labs overall look improved.  Wound was incised and drained as dictated above with copious mixture of purulent material and blood.  Forehead appears back to normal almost after this incision and drainage.  To be placed back on antibiotics for 7 days with pain medicine.  Daryel November MD    Note: This note was generated in part or whole with voice recognition software. Voice recognition is usually quite accurate but there are transcription errors that can and very often do occur. I apologize for any typographical errors that were not detected and corrected.     Emily Filbert, MD 10/03/19 [phone]

## 2019-10-07 LAB — CULTURE, BLOOD (ROUTINE X 2)
Culture: NO GROWTH
Special Requests: ADEQUATE

## 2019-10-08 LAB — AEROBIC/ANAEROBIC CULTURE W GRAM STAIN (SURGICAL/DEEP WOUND): Special Requests: NORMAL

## 2019-10-24 ENCOUNTER — Ambulatory Visit: Payer: Medicaid Other

## 2019-10-25 ENCOUNTER — Ambulatory Visit: Payer: Self-pay | Admitting: Family Medicine

## 2019-10-25 ENCOUNTER — Ambulatory Visit: Payer: Medicaid Other

## 2019-10-25 ENCOUNTER — Encounter: Payer: Self-pay | Admitting: Family Medicine

## 2019-10-25 ENCOUNTER — Other Ambulatory Visit: Payer: Self-pay

## 2019-10-25 DIAGNOSIS — A549 Gonococcal infection, unspecified: Secondary | ICD-10-CM

## 2019-10-25 DIAGNOSIS — N5089 Other specified disorders of the male genital organs: Secondary | ICD-10-CM

## 2019-10-25 DIAGNOSIS — Z113 Encounter for screening for infections with a predominantly sexual mode of transmission: Secondary | ICD-10-CM

## 2019-10-25 LAB — GRAM STAIN

## 2019-10-25 MED ORDER — CEFTRIAXONE SODIUM 500 MG IJ SOLR
500.0000 mg | Freq: Once | INTRAMUSCULAR | Status: AC
  Administered 2019-10-25: 500 mg via INTRAMUSCULAR

## 2019-10-25 MED ORDER — DOXYCYCLINE HYCLATE 100 MG PO TABS: 100.0000 mg | ORAL_TABLET | Freq: Two times a day (BID) | ORAL | 0 refills | Status: AC

## 2019-10-25 NOTE — Progress Notes (Signed)
[facility] Department STI clinic/screening visit  Subjective:  Philip Owen is a 44 y.o. male being seen today for  Chief Complaint  Patient presents with  . Exposure to STD     The patient reports they do have symptoms.   Patient has the following medical conditions:   Patient Active Problem List   Diagnosis Date Noted  . Sepsis (HCC) 09/13/2019  . Facial cellulitis 09/13/2019  . AKI (acute kidney injury) (HCC) 09/13/2019  . Pneumonia 05/22/2017    HPI  Pt reports moderate amount of discharge and burning w/urination x a couple days. Would like STI screening.   Pt last urinated 1.5 hrs ago.  See flowsheet for further details and programmatic requirements.   Last HIV test per pt/review of record: 01/2019 Last tetanus vaccine: 1.5 yrs ago   No components found for: HCV  The following portions of the patient's history were reviewed and updated as appropriate: allergies, current medications, past medical history, past social history, past surgical history and problem list.  Objective:  There were no vitals filed for this visit.   Physical Exam Constitutional:      Appearance: Normal appearance.  HENT:     Head: Normocephalic and atraumatic.     Comments: No nits or hair loss    Mouth/Throat:     Mouth: Mucous membranes are moist.     Pharynx: Oropharynx is clear. No oropharyngeal exudate or posterior oropharyngeal erythema.  Pulmonary:     Effort: Pulmonary effort is normal.  Abdominal:     General: Abdomen is flat.     Palpations: Abdomen is soft. There is no hepatomegaly or mass.     Tenderness: There is no abdominal tenderness.  Genitourinary:    Pubic Area: No rash or pubic lice.      Penis: Circumcised. Discharge (cloudy yellow, drips) present.      Testes:        Right: Mass (nontender, mobile mass superior testicle) present. Tenderness or swelling not present. Right testis is descended. Cremasteric reflex is present.         Left: Mass,  tenderness or swelling not present. Left testis is descended. Cremasteric reflex is present.      Epididymis:     Right: Normal.     Left: Normal.     Rectum: Normal.  Lymphadenopathy:     Head:     Right side of head: No preauricular or posterior auricular adenopathy.     Left side of head: No preauricular or posterior auricular adenopathy.     Cervical: Cervical adenopathy present.     Right cervical: Superficial cervical adenopathy (very small lymph node enlargement) present.     Upper Body:     Right upper body: No supraclavicular or axillary adenopathy.     Left upper body: No supraclavicular or axillary adenopathy.     Lower Body: No right inguinal adenopathy. No left inguinal adenopathy.  Skin:    General: Skin is warm and dry.     Findings: No rash.  Neurological:     Mental Status: He is alert and oriented to person, place, and time.       Assessment and Plan:  Philip Owen is a 44 y.o. male presenting to the [facility] Department for STI screening    1. Screening examination for venereal disease -Pt with symptoms. Screenings today as below. Treat gram stain per standing order. -Patient does meet criteria for HepC Screening. Accepts this screenings. -Counseled on  warning s/sx and when to seek care. Recommended condom use with all sex and discussed importance of condom use for STI prevention. - Gram stain - HIV [place] [facility] - Syphilis Serology, [facility] - HCV [place] [facility] - Gonococcus culture  2. Gonorrhea -Treatment today as below. Pt weight <300 lbs. -As pt has no confirmed negative chlamydia result we will treat with doxycycline as well.  -Pt counseled regarding medication. No known allergies to these medications.  -Advised no sex for 7 days after both pt and partner completes treatment and encouraged condoms with all sex. - cefTRIAXone (ROCEPHIN) injection 500 mg - doxycycline (VIBRA-TABS) 100 MG tablet; Take 1 tablet (100 mg total) by  mouth 2 (two) times daily for 7 days.  Dispense: 14 tablet; Refill: 0  3. Scrotal mass -PCP list given, advised to f/u asap for likely [place] of R testicular mass (as well as for f/u to R cervical lymph node if does not resolve).   Return in about 3 months (around 01/25/2020) for TOC.  No future appointments.  Philip Keen, PA-C

## 2019-10-25 NOTE — Progress Notes (Signed)
Gram stain reviewed, patient treated for gonorrhea per SO. PCP list given. Patient counseled to remain in clinic for 20 minutes following ceftriaxone injection. Patient agrees to plan.Burt Knack, RN

## 2019-10-25 NOTE — Progress Notes (Signed)
Here for STD screening.Philip Uballe Brewer-Jensen, RN 

## 2019-10-25 NOTE — Progress Notes (Signed)
Patient denies problems with breathing, rash after 20 minutes observation. Patient has now left clinic.Burt Knack, RN

## 2019-11-05 LAB — HM HEPATITIS C SCREENING LAB: HM Hepatitis Screen: NEGATIVE

## 2019-11-05 LAB — HM HIV SCREENING LAB: HM HIV Screening: NEGATIVE

## 2019-11-08 ENCOUNTER — Encounter: Payer: Self-pay | Admitting: Emergency Medicine

## 2019-11-08 ENCOUNTER — Emergency Department
Admission: EM | Admit: 2019-11-08 | Discharge: 2019-11-08 | Disposition: A | Discharge status: Home / Self Care | Payer: Medicaid Other | Attending: Emergency Medicine | Admitting: Emergency Medicine

## 2019-11-08 ENCOUNTER — Other Ambulatory Visit: Payer: Self-pay

## 2019-11-08 DIAGNOSIS — Z4800 Encounter for change or removal of nonsurgical wound dressing: Secondary | ICD-10-CM | POA: Insufficient documentation

## 2019-11-08 DIAGNOSIS — F17210 Nicotine dependence, cigarettes, uncomplicated: Secondary | ICD-10-CM | POA: Insufficient documentation

## 2019-11-08 DIAGNOSIS — Z5189 Encounter for other specified aftercare: Secondary | ICD-10-CM

## 2019-11-08 DIAGNOSIS — L03811 Cellulitis of head [any part, except face]: Secondary | ICD-10-CM | POA: Insufficient documentation

## 2019-11-08 NOTE — ED Triage Notes (Signed)
Pt reports had cellulitis on his head and wants to have iot checked to make sure it had healed. No pain

## 2019-11-08 NOTE — ED Provider Notes (Signed)
[facility] Emergency Department Provider Note  ____________________________________________   First MD Initiated Contact with Patient 11/08/19 1111     (approximate)  I have reviewed the triage vital signs and the nursing notes.   HISTORY  Chief Complaint Wound Check    HPI Philip Owen is a 44 y.o. male presents to the emergency department wanting a wound check of a cellulitis on his forehead.  States he was seen here at the beginning of June and was admitted for 5 days because of infection.  Since then he is also been to the health department and been treated for gonorrhea.  Patient denies any change in the way the skin appears.  He states just worried.  No fever or chills.    Past Medical History:  Diagnosis Date  . Acid reflux   . Anxiety   . GERD (gastroesophageal reflux disease)     Patient Active Problem List   Diagnosis Date Noted  . Sepsis (HCC) 09/13/2019  . Facial cellulitis 09/13/2019  . AKI (acute kidney injury) (HCC) 09/13/2019  . Pneumonia 05/22/2017    Past Surgical History:  Procedure Laterality Date  . none      Prior to Admission medications   Medication Sig Start Date End Date Taking? Authorizing Provider  esomeprazole (NEXIUM 24HR) 20 MG capsule Take 20 mg by mouth daily at 12 noon.    [provider]    Allergies Tramadol and Naproxen  Family History  Problem Relation Age of Onset  . Brain cancer Father     Social History Social History   Tobacco Use  . Smoking status: Current Every Day Smoker    Packs/day: 0.50    Years: 20.00    Pack years: 10.00    Types: Cigarettes  . Smokeless tobacco: Never Used  Vaping Use  . Vaping Use: Never used  Substance Use Topics  . Alcohol use: No  . Drug use: Not Currently    Types: Cocaine    Review of Systems  Constitutional: No fever/chills Eyes: No visual changes. ENT: No sore throat. Respiratory: Denies cough Genitourinary: Negative for  dysuria. Musculoskeletal: Negative for back pain. Skin: Negative for rash. Psychiatric: no mood changes,     ____________________________________________   PHYSICAL EXAM:  VITAL SIGNS: ED Triage Vitals  Enc Vitals Group     BP 11/08/19 1107 (!) 146/94     Pulse Rate 11/08/19 1107 71     Resp 11/08/19 1107 18     Temp 11/08/19 1107 98.1 F (36.7 C)     Temp Source 11/08/19 1107 Oral     SpO2 11/08/19 1107 98 %     Weight 11/08/19 1053 155 lb (70.3 kg)     Height 11/08/19 1053 5\' 8"  (1.727 m)     Head Circumference --      Peak Flow --      Pain Score 11/08/19 1053 0     Pain Loc --      Pain Edu? --      Excl. in [place]? --     Constitutional: Alert and oriented. Well appearing and in no acute distress. Eyes: Conjunctivae are normal.  Head: Atraumatic.  No redness or swelling noted around the wound, the wound appears to be healing well, no lymphadenopathy noted Nose: No congestion/rhinnorhea. Mouth/Throat: Mucous membranes are moist.   Neck:  supple no lymphadenopathy noted Cardiovascular: Normal rate, regular rhythm.  Respiratory: Normal respiratory effort.  No retractions,  GU: deferred Musculoskeletal: FROM  all extremities, warm and well perfused Neurologic:  Normal speech and language.  Skin:  Skin is warm, dry and intact. No rash noted. Psychiatric: Mood and affect are normal. Speech and behavior are normal.  ____________________________________________   LABS (all labs ordered are listed, but only abnormal results are displayed)  Labs Reviewed - No data to display ____________________________________________   ____________________________________________  RADIOLOGY    ____________________________________________   PROCEDURES  Procedure(s) performed: No  Procedures    ____________________________________________   INITIAL IMPRESSION / ASSESSMENT AND PLAN / ED COURSE  Pertinent labs & imaging results that were available during my care of the  patient were reviewed by me and considered in my medical decision making (see chart for details).   Patient is here for a wound check.  His wound from 2 months ago appears to be well.  He was discharged stable condition.      As part of my medical decision making, I reviewed the following data within the electronic MEDICAL RECORD NUMBER Nursing notes reviewed and incorporated, Old chart reviewed, Notes from prior ED visits and [place] Controlled Substance Database  ____________________________________________   FINAL CLINICAL IMPRESSION(S) / ED DIAGNOSES  Final diagnoses:  Encounter for wound re-check      NEW MEDICATIONS STARTED DURING THIS VISIT:  New Prescriptions   No medications on file     Note:  This document was prepared using Dragon voice recognition software and may include unintentional dictation errors.    Faythe Ghee, PA-C 11/08/19 1142    Jene Every, MD 11/08/19 1143

## 2019-11-08 NOTE — ED Notes (Signed)
See triage note, pt reports was dx was cellulitis to forehead a couple months ago and wants to make sure it is treated and does not need additional antibiotics. No swelling or redness noted to forehead at this time.  Pt also reports swelling to right ankle for the past few days, reports taking "fluid pill" given to him by his mother, denies rx for diuretics. Denies injury to right ankle. No swelling or redness noted to ankle

## 2019-11-08 NOTE — ED Notes (Signed)
Patient not in room when went in to give discharge instructions.

## 2019-12-04 ENCOUNTER — Emergency Department: Payer: Medicaid Other

## 2019-12-04 ENCOUNTER — Other Ambulatory Visit: Payer: Self-pay

## 2019-12-04 ENCOUNTER — Emergency Department
Admission: EM | Admit: 2019-12-04 | Discharge: 2019-12-04 | Disposition: A | Discharge status: Home / Self Care | Payer: Medicaid Other | Attending: Emergency Medicine | Admitting: Emergency Medicine

## 2019-12-04 DIAGNOSIS — L03116 Cellulitis of left lower limb: Secondary | ICD-10-CM | POA: Insufficient documentation

## 2019-12-04 DIAGNOSIS — F17210 Nicotine dependence, cigarettes, uncomplicated: Secondary | ICD-10-CM | POA: Insufficient documentation

## 2019-12-04 LAB — CBC
HCT: 39.8 % (ref 39.0–52.0)
Hemoglobin: 13.7 g/dL (ref 13.0–17.0)
MCH: 28.7 pg (ref 26.0–34.0)
MCHC: 34.4 g/dL (ref 30.0–36.0)
MCV: 83.3 fL (ref 80.0–100.0)
Platelets: 292 10*3/uL (ref 150–400)
RBC: 4.78 MIL/uL (ref 4.22–5.81)
RDW: 12.2 % (ref 11.5–15.5)
WBC: 9.8 10*3/uL (ref 4.0–10.5)
nRBC: 0 % (ref 0.0–0.2)

## 2019-12-04 LAB — BASIC METABOLIC PANEL
Anion gap: 10 (ref 5–15)
BUN: 12 mg/dL (ref 6–20)
CO2: 25 mmol/L (ref 22–32)
Calcium: 9.4 mg/dL (ref 8.9–10.3)
Chloride: 101 mmol/L (ref 98–111)
Creatinine, Ser: 0.74 mg/dL (ref 0.61–1.24)
GFR calc Af Amer: >60 mL/min (ref >60)
GFR calc non Af Amer: >60 mL/min (ref >60)
Glucose, Bld: 92 mg/dL (ref 70–99)
Potassium: 4.3 mmol/L (ref 3.5–5.1)
Sodium: 136 mmol/L (ref 135–145)

## 2019-12-04 LAB — URIC ACID: Uric Acid, Serum: 4.5 mg/dL (ref 3.7–8.6)

## 2019-12-04 MED ORDER — CEPHALEXIN 500 MG PO CAPS
500.0000 mg | ORAL_CAPSULE | Freq: Once | ORAL | Status: AC
  Administered 2019-12-04: 500 mg via ORAL
  Filled 2019-12-04: qty 1

## 2019-12-04 MED ORDER — CEPHALEXIN 500 MG PO CAPS: 500.0000 mg | ORAL_CAPSULE | Freq: Three times a day (TID) | ORAL | 0 refills | Status: DC

## 2019-12-04 MED ORDER — DEXAMETHASONE SODIUM PHOSPHATE 10 MG/ML IJ SOLN
10.0000 mg | Freq: Once | INTRAMUSCULAR | Status: DC
  Filled 2019-12-04: qty 1

## 2019-12-04 NOTE — ED Notes (Signed)
See triage note   Presents with pain and swelling to left lower leg  Pain is anterior and swelling is at ankle  Good pulses

## 2019-12-04 NOTE — ED Provider Notes (Signed)
[facility] Emergency Department Provider Note ____________________________________________  Time seen: 1500  I have reviewed the triage vital signs and the nursing notes.  HISTORY  Chief Complaint  Leg Pain   HPI Philip Owen is a 44 y.o. male presents to the clinic today with c/o left leg pain, swelling and redness. He noticed this 2 days ago. He describes the pain as sharp and shooting. The pain does not radiate. He denies numbness, tingling or weakness. The pain is worse with ambulation.He denies any injury to the area. He has no history of gout. He denies insect bite or sting. He is a smoker. He has taken Tylenol OTC with minimal relief of symptoms.  Past Medical History:  Diagnosis Date  . Acid reflux   . Anxiety   . GERD (gastroesophageal reflux disease)     Patient Active Problem List   Diagnosis Date Noted  . Sepsis (HCC) 09/13/2019  . Facial cellulitis 09/13/2019  . AKI (acute kidney injury) (HCC) 09/13/2019  . Pneumonia 05/22/2017    Past Surgical History:  Procedure Laterality Date  . none      Prior to Admission medications   Medication Sig Start Date End Date Taking? Authorizing Provider  cephALEXin (KEFLEX) 500 MG capsule Take 1 capsule (500 mg total) by mouth 3 (three) times daily for 10 days. 12/04/19 12/14/19  Lorre Munroe, NP  esomeprazole (NEXIUM 24HR) 20 MG capsule Take 20 mg by mouth daily at 12 noon.    [provider]    Allergies Tramadol and Naproxen  Family History  Problem Relation Age of Onset  . Brain cancer Father     Social History Social History   Tobacco Use  . Smoking status: Current Every Day Smoker    Packs/day: 0.50    Years: 20.00    Pack years: 10.00    Types: Cigarettes  . Smokeless tobacco: Never Used  Vaping Use  . Vaping Use: Never used  Substance Use Topics  . Alcohol use: No  . Drug use: Not Currently    Types: Cocaine    Review of Systems  Constitutional: Negative  for fever, chills or body aches. Cardiovascular: Negative for chest pain or chest tightness. Respiratory: Negative for cough or shortness of breath. Musculoskeletal: Positive for left lower leg pain and swelling, difficulty with gait. Skin: Positive for redness and warmth of the left lower leg.  Negative for rash. Neurological: Negative for focal weakness, tingling or numbness. ____________________________________________  PHYSICAL EXAM:  VITAL SIGNS: ED Triage Vitals  Enc Vitals Group     BP 12/04/19 1321 (!) 148/89     Pulse Rate 12/04/19 1321 85     Resp 12/04/19 1321 16     Temp 12/04/19 1321 98.5 F (36.9 C)     Temp Source 12/04/19 1321 Oral     SpO2 12/04/19 1321 98 %     Weight 12/04/19 1322 155 lb (70.3 kg)     Height 12/04/19 1322 5\' 8"  (1.727 m)     Head Circumference --      Peak Flow --      Pain Score 12/04/19 1321 10     Pain Loc --      Pain Edu? --      Excl. in GC? --     Constitutional: Alert and oriented.In no distress. Head: Normocephalic. Eyes: Conjunctivae are normal. PERRL. Normal extraocular movements Cardiovascular: Normal rate, regular rhythm.  Pedal pulse 2+ on the left. Respiratory: Normal respiratory effort.  No wheezes/rales/rhonchi. Musculoskeletal: Normal flexion, extension and rotation of the left ankle.  1+ swelling over the medial and lateral malleoli.  Pain with palpation over the distal tibia.  He could stand on heels and toes. Neurologic: Normal speech and language. No gross focal neurologic deficits are appreciated. Skin:  Skin is warm, dry and intact.  Area of erythema noted over the distal tibia.  ____________________________________________   LABS Labs Reviewed  CBC  BASIC METABOLIC PANEL  URIC ACID    ____________________________________________   RADIOLOGY   Imaging Orders     US Venous Img Lower Unilateral Left     DG Tibia/Fibula Left   IMPRESSION: Negative for DVT in the left lower extremity.  IMPRESSION:  Negative.   ____________________________________________   INITIAL IMPRESSION / ASSESSMENT AND PLAN / ED COURSE  Left Lower Leg Pain and Swelling:  DDx include DVT, superficial thrombophlebitis, gout, ankle sprain, Venous ultrasound negative Xray tib/fib negative CBC, BMET, uric acid level Keflex 500 mg PO x 1   I reviewed the patient's prescription history over the last 12 months in the multi-state controlled substances database(s) that includes [place], [place], [place], [place], [place], [place], [place], [place], [place], [place], [place], [place], [place], and [place].  Results were notable for Percocet 5-325 #20, 10/07/19. ____________________________________________  FINAL CLINICAL IMPRESSION(S) / ED DIAGNOSES  Final diagnoses:  Cellulitis of left leg  .    Lorre Munroe, NP 12/04/19 1613    Chesley Noon, MD 12/04/19 1721

## 2019-12-04 NOTE — ED Triage Notes (Signed)
LLE pain, swelling and redness X 2 days. Pain worse with walking.

## 2019-12-04 NOTE — Discharge Instructions (Addendum)
You were seen today for pain, redness and swelling of your left lower extremity.  You have been diagnosed with cellulitis which is a skin infection.  We gave you a dose of antibiotics here.  I given you a prescription for antibiotics to take 3 times daily for the next 10 days.  Please follow-up for increased pain, redness or swelling.

## 2019-12-10 ENCOUNTER — Encounter: Payer: Self-pay | Admitting: Emergency Medicine

## 2019-12-10 ENCOUNTER — Other Ambulatory Visit: Payer: Self-pay

## 2019-12-10 ENCOUNTER — Emergency Department
Admission: EM | Admit: 2019-12-10 | Discharge: 2019-12-10 | Disposition: A | Discharge status: Home / Self Care | Payer: Medicaid Other | Attending: Emergency Medicine | Admitting: Emergency Medicine

## 2019-12-10 DIAGNOSIS — F17210 Nicotine dependence, cigarettes, uncomplicated: Secondary | ICD-10-CM | POA: Insufficient documentation

## 2019-12-10 DIAGNOSIS — R197 Diarrhea, unspecified: Secondary | ICD-10-CM | POA: Insufficient documentation

## 2019-12-10 DIAGNOSIS — Z20822 Contact with and (suspected) exposure to covid-19: Secondary | ICD-10-CM | POA: Insufficient documentation

## 2019-12-10 DIAGNOSIS — E860 Dehydration: Secondary | ICD-10-CM | POA: Insufficient documentation

## 2019-12-10 DIAGNOSIS — E876 Hypokalemia: Secondary | ICD-10-CM | POA: Insufficient documentation

## 2019-12-10 DIAGNOSIS — L03116 Cellulitis of left lower limb: Secondary | ICD-10-CM | POA: Insufficient documentation

## 2019-12-10 LAB — CBC WITH DIFFERENTIAL/PLATELET
Abs Immature Granulocytes: 0.02 10*3/uL (ref 0.00–0.07)
Basophils Absolute: 0 10*3/uL (ref 0.0–0.1)
Basophils Relative: 0 %
Eosinophils Absolute: 0.3 10*3/uL (ref 0.0–0.5)
Eosinophils Relative: 3 %
HCT: 34.4 % — ABNORMAL LOW (ref 39.0–52.0)
Hemoglobin: 11.5 g/dL — ABNORMAL LOW (ref 13.0–17.0)
Immature Granulocytes: 0 %
Lymphocytes Relative: 37 %
Lymphs Abs: 3.7 10*3/uL (ref 0.7–4.0)
MCH: 28.7 pg (ref 26.0–34.0)
MCHC: 33.4 g/dL (ref 30.0–36.0)
MCV: 85.8 fL (ref 80.0–100.0)
Monocytes Absolute: 0.8 10*3/uL (ref 0.1–1.0)
Monocytes Relative: 9 %
Neutro Abs: 4.9 10*3/uL (ref 1.7–7.7)
Neutrophils Relative %: 51 %
Platelets: 302 10*3/uL (ref 150–400)
RBC: 4.01 MIL/uL — ABNORMAL LOW (ref 4.22–5.81)
RDW: 12.3 % (ref 11.5–15.5)
WBC: 9.8 10*3/uL (ref 4.0–10.5)
nRBC: 0 % (ref 0.0–0.2)

## 2019-12-10 LAB — COMPREHENSIVE METABOLIC PANEL
ALT: 20 U/L (ref 0–44)
AST: 23 U/L (ref 15–41)
Albumin: 4.2 g/dL (ref 3.5–5.0)
Alkaline Phosphatase: 67 U/L (ref 38–126)
Anion gap: 10 (ref 5–15)
BUN: 19 mg/dL (ref 6–20)
CO2: 24 mmol/L (ref 22–32)
Calcium: 8.8 mg/dL — ABNORMAL LOW (ref 8.9–10.3)
Chloride: 108 mmol/L (ref 98–111)
Creatinine, Ser: 1.15 mg/dL (ref 0.61–1.24)
GFR calc Af Amer: >60 mL/min (ref >60)
GFR calc non Af Amer: >60 mL/min (ref >60)
Glucose, Bld: 112 mg/dL — ABNORMAL HIGH (ref 70–99)
Potassium: 3.3 mmol/L — ABNORMAL LOW (ref 3.5–5.1)
Sodium: 142 mmol/L (ref 135–145)
Total Bilirubin: 0.8 mg/dL (ref 0.3–1.2)
Total Protein: 6.7 g/dL (ref 6.5–8.1)

## 2019-12-10 LAB — SARS CORONAVIRUS 2 BY RT PCR (HOSPITAL ORDER, PERFORMED IN ~~LOC~~ HOSPITAL LAB): SARS Coronavirus 2: NEGATIVE

## 2019-12-10 LAB — LACTIC ACID, PLASMA: Lactic Acid, Venous: 1.9 mmol/L (ref 0.5–1.9)

## 2019-12-10 MED ORDER — POTASSIUM CHLORIDE CRYS ER 20 MEQ PO TBCR
40.0000 meq | EXTENDED_RELEASE_TABLET | Freq: Once | ORAL | Status: AC
  Administered 2019-12-10: 40 meq via ORAL
  Filled 2019-12-10: qty 2

## 2019-12-10 MED ORDER — VANCOMYCIN HCL 1750 MG/350ML IV SOLN
1750.0000 mg | Freq: Once | INTRAVENOUS | Status: AC
  Administered 2019-12-10: 1750 mg via INTRAVENOUS
  Filled 2019-12-10: qty 350

## 2019-12-10 MED ORDER — LACTATED RINGERS IV BOLUS
1000.0000 mL | Freq: Once | INTRAVENOUS | Status: AC
  Administered 2019-12-10: 1000 mL via INTRAVENOUS

## 2019-12-10 MED ORDER — DOXYCYCLINE HYCLATE 100 MG PO CAPS: 100.0000 mg | ORAL_CAPSULE | Freq: Two times a day (BID) | ORAL | 0 refills | Status: AC

## 2019-12-10 MED ORDER — ACETAMINOPHEN 500 MG PO TABS
1000.0000 mg | ORAL_TABLET | Freq: Once | ORAL | Status: AC
  Administered 2019-12-10: 1000 mg via ORAL
  Filled 2019-12-10: qty 2

## 2019-12-10 NOTE — ED Triage Notes (Signed)
Patient presents to the ED with slight redness to his left ankle.  Patient states he was seen in the ED 3 days ago for the same and diagnosed with cellulitis and was placed on keflex.  Patient states redness and swelling has not improved.  Patient states area is very painful and reports fever at home.

## 2019-12-10 NOTE — ED Notes (Signed)
Per registration, patient left the room and is in the lobby.

## 2019-12-10 NOTE — ED Provider Notes (Signed)
[facility] Emergency Department Provider Note  ____________________________________________   First MD Initiated Contact with Patient 12/10/19 1435     (approximate)  I have reviewed the triage vital signs and the nursing notes.   HISTORY  Chief Complaint Cellulitis   HPI Philip Owen is a 44 y.o. male with a past medical history of previous cellulitis of the face and sepsis who presents for assessment of persistent and worsening pain redness and swelling in his left lower leg for the past 5 days.  Patient notes he was seen on 8/4 and diagnosed with cellulitis and prescribed Keflex which has not helped.  He states that he has been febrile to 101 over the last several days as well as having chills, fatigue, and malaise.  He endorses some nausea and vomiting for 5 days ago but none in the last 3 days.  He denies any headache, earache, sore throat, chest pain, cough, shortness of breath, abdominal pain, back pain, burning with urination, diarrhea, stool, blood in his urine, rash elsewhere or any other areas of redness pain or swelling.  No clear alleviating aggravating factors other than palpation inflation of the left lower leg.  No prior similar episodes in the left leg.  Patient denies illegal drug use, EtOH use, or tobacco abuse.         Past Medical History:  Diagnosis Date  . Acid reflux   . Anxiety   . GERD (gastroesophageal reflux disease)     Patient Active Problem List   Diagnosis Date Noted  . Sepsis (HCC) 09/13/2019  . Facial cellulitis 09/13/2019  . AKI (acute kidney injury) (HCC) 09/13/2019  . Pneumonia 05/22/2017    Past Surgical History:  Procedure Laterality Date  . none      Prior to Admission medications   Medication Sig Start Date End Date Taking? Authorizing Provider  doxycycline (VIBRAMYCIN) 100 MG capsule Take 1 capsule (100 mg total) by mouth 2 (two) times daily for 7 days. 12/10/19 12/17/19  Gilles Chiquito, MD    esomeprazole (NEXIUM 24HR) 20 MG capsule Take 20 mg by mouth daily at 12 noon.    [provider]    Allergies Tramadol and Naproxen  Family History  Problem Relation Age of Onset  . Brain cancer Father     Social History Social History   Tobacco Use  . Smoking status: Current Every Day Smoker    Packs/day: 0.50    Years: 20.00    Pack years: 10.00    Types: Cigarettes  . Smokeless tobacco: Never Used  Vaping Use  . Vaping Use: Never used  Substance Use Topics  . Alcohol use: No  . Drug use: Not Currently    Types: Cocaine    Review of Systems  Review of Systems  Constitutional: Positive for fever and malaise/fatigue. Negative for chills.  HENT: Negative for sore throat.   Eyes: Negative for pain.  Respiratory: Negative for cough and stridor.   Cardiovascular: Negative for chest pain.  Gastrointestinal: Positive for diarrhea. Negative for vomiting.  Musculoskeletal: Positive for myalgias ( left lower leg). Negative for back pain and neck pain.  Skin: Negative for rash.  Neurological: Negative for seizures, loss of consciousness and headaches.  Psychiatric/Behavioral: Negative for suicidal ideas.  All other systems reviewed and are negative.     ____________________________________________   PHYSICAL EXAM:  VITAL SIGNS: ED Triage Vitals [12/10/19 1356]  Enc Vitals Group     BP (!) 139/93  Pulse Rate (!) 106     Resp 18     Temp 99.2 F (37.3 C)     Temp Source Oral     SpO2 97 %     Weight 155 lb (70.3 kg)     Height 5\' 8"  (1.727 m)     Head Circumference      Peak Flow      Pain Score 9     Pain Loc      Pain Edu?      Excl. in GC?    Vitals:   12/10/19 1356 12/10/19 1613  BP: (!) 139/93 (!) 155/97  Pulse: (!) 106 92  Resp: 18 16  Temp: 99.2 F (37.3 C) 98.2 F (36.8 C)  SpO2: 97% 98%   Physical Exam Vitals and nursing note reviewed.  Constitutional:      Appearance: He is well-developed.  HENT:     Head: Normocephalic  and atraumatic.     Right Ear: External ear normal.     Left Ear: External ear normal.     Nose: Nose normal.     Mouth/Throat:     Mouth: Mucous membranes are moist.  Eyes:     Conjunctiva/sclera: Conjunctivae normal.  Cardiovascular:     Rate and Rhythm: Regular rhythm. Tachycardia present.     Heart sounds: No murmur heard.   Pulmonary:     Effort: Pulmonary effort is normal. No respiratory distress.     Breath sounds: Normal breath sounds.  Abdominal:     Palpations: Abdomen is soft.     Tenderness: There is no abdominal tenderness.  Musculoskeletal:     Cervical back: Neck supple.  Skin:    General: Skin is warm and dry.     Capillary Refill: Capillary refill takes less than 2 seconds.  Neurological:     Mental Status: He is alert and oriented to person, place, and time.  Psychiatric:        Mood and Affect: Mood normal.     Patient does have an area of erythema, edema, warmth, and tenderness over the anterior aspect of the left lower leg from right superior to the left ankle up to the mid calf.  It is primarily anterior and bilaterally sparing the posterior aspect.  Patient does have full range of motion of left ankle although with some pain.  2+ DP pulse.  Sensation intact light touch throughout left lower extremity.  Patient's left knee and remainder of the left lower extremity is unremarkable.  ____________________________________________   LABS (all labs ordered are listed, but only abnormal results are displayed)  Labs Reviewed  CBC WITH DIFFERENTIAL/PLATELET - Abnormal; Notable for the following components:      Result Value   RBC 4.01 (*)    Hemoglobin 11.5 (*)    HCT 34.4 (*)    All other components within normal limits  COMPREHENSIVE METABOLIC PANEL - Abnormal; Notable for the following components:   Potassium 3.3 (*)    Glucose, Bld 112 (*)    Calcium 8.8 (*)    All other components within normal limits  CULTURE, BLOOD (ROUTINE X 2)  CULTURE, BLOOD  (ROUTINE X 2)  SARS CORONAVIRUS 2 BY RT PCR (HOSPITAL ORDER, PERFORMED IN [facility])  LACTIC ACID, PLASMA  LACTIC ACID, PLASMA   ______________________________   PROCEDURES  Procedure(s) performed (including Critical Care):  Procedures   ____________________________________________   INITIAL IMPRESSION / ASSESSMENT AND PLAN / ED COURSE  Oral patient's history, exam, and ED work-up is concerning for cellulitis not responsive to Keflex.  Patient is noted to be slightly tachycardic on arrival he is otherwise afebrile and hemodynamically stable.  I did review his prior visit he was noted at that time to have an ultrasound that showed no evidence of a DVT and x-ray that was unremarkable.  Labs obtained above showed no evidence of elevated white blood cell count and normal lactic acid.  Patient was given IV fluids, Tylenol, and 1 dose of IV antibiotics.  Given patient is afebrile with no elevation of his white blood cell count and normal lactic acid do not believe patient is septic at this time.  Patient was given potassium, IV fluids for mild dehydration, and 1 dose of IV antibiotics.  On reassessment his heart rate was noted to have decreased to 92 he remained afebrile.  Given patient is not responded to Keflex and he has reported history of abscesses in the past concern for possible MRSA infection.  Will change antibiotics to doxycycline.  Discussed with patient importance of close outpatient follow-up in next 2 to 3 days and he should return immediately to the emergency room should he experience any new new symptoms or acute worsening of his current symptoms.  Discussed appropriate use of Tylenol Motrin for pain control.  Discharge stable condition.  Strict precautions advised and discussed.  Medications  vancomycin (VANCOREADY) IVPB 1750 mg/350 mL (1,750 mg Intravenous New Bag/Given 12/10/19 1611)  lactated ringers bolus 1,000 mL (1,000 mLs Intravenous New Bag/Given  12/10/19 1528)  acetaminophen (TYLENOL) tablet 1,000 mg (1,000 mg Oral Given 12/10/19 1528)  potassium chloride SA (KLOR-CON) CR tablet 40 mEq (40 mEq Oral Given 12/10/19 1619)             ____________________________________________   FINAL CLINICAL IMPRESSION(S) / ED DIAGNOSES  Final diagnoses:  Cellulitis of left lower extremity  Hypokalemia  Dehydration     ED Discharge Orders         Ordered    doxycycline (VIBRAMYCIN) 100 MG capsule  2 times daily     Discontinue  Reprint     12/10/19 1616           Note:  This document was prepared using Dragon voice recognition software and may include unintentional dictation errors.   Gilles Chiquito, MD 12/10/19 [phone]

## 2019-12-11 ENCOUNTER — Encounter: Payer: Self-pay | Admitting: *Deleted

## 2019-12-11 ENCOUNTER — Other Ambulatory Visit: Payer: Self-pay

## 2019-12-11 DIAGNOSIS — Z5321 Procedure and treatment not carried out due to patient leaving prior to being seen by health care provider: Secondary | ICD-10-CM | POA: Insufficient documentation

## 2019-12-11 DIAGNOSIS — L03116 Cellulitis of left lower limb: Secondary | ICD-10-CM | POA: Insufficient documentation

## 2019-12-11 LAB — CBC WITH DIFFERENTIAL/PLATELET
Abs Immature Granulocytes: 0.02 10*3/uL (ref 0.00–0.07)
Basophils Absolute: 0.1 10*3/uL (ref 0.0–0.1)
Basophils Relative: 1 %
Eosinophils Absolute: 0.4 10*3/uL (ref 0.0–0.5)
Eosinophils Relative: 5 %
HCT: 33.5 % — ABNORMAL LOW (ref 39.0–52.0)
Hemoglobin: 11.1 g/dL — ABNORMAL LOW (ref 13.0–17.0)
Immature Granulocytes: 0 %
Lymphocytes Relative: 37 %
Lymphs Abs: 3.1 10*3/uL (ref 0.7–4.0)
MCH: 28.6 pg (ref 26.0–34.0)
MCHC: 33.1 g/dL (ref 30.0–36.0)
MCV: 86.3 fL (ref 80.0–100.0)
Monocytes Absolute: 0.5 10*3/uL (ref 0.1–1.0)
Monocytes Relative: 7 %
Neutro Abs: 4.2 10*3/uL (ref 1.7–7.7)
Neutrophils Relative %: 50 %
Platelets: 277 10*3/uL (ref 150–400)
RBC: 3.88 MIL/uL — ABNORMAL LOW (ref 4.22–5.81)
RDW: 12.1 % (ref 11.5–15.5)
WBC: 8.3 10*3/uL (ref 4.0–10.5)
nRBC: 0 % (ref 0.0–0.2)

## 2019-12-11 LAB — COMPREHENSIVE METABOLIC PANEL
ALT: 23 U/L (ref 0–44)
AST: 22 U/L (ref 15–41)
Albumin: 4.1 g/dL (ref 3.5–5.0)
Alkaline Phosphatase: 68 U/L (ref 38–126)
Anion gap: 9 (ref 5–15)
BUN: 17 mg/dL (ref 6–20)
CO2: 26 mmol/L (ref 22–32)
Calcium: 8.7 mg/dL — ABNORMAL LOW (ref 8.9–10.3)
Chloride: 107 mmol/L (ref 98–111)
Creatinine, Ser: 0.85 mg/dL (ref 0.61–1.24)
GFR calc Af Amer: >60 mL/min (ref >60)
GFR calc non Af Amer: >60 mL/min (ref >60)
Glucose, Bld: 86 mg/dL (ref 70–99)
Potassium: 3.5 mmol/L (ref 3.5–5.1)
Sodium: 142 mmol/L (ref 135–145)
Total Bilirubin: 0.7 mg/dL (ref 0.3–1.2)
Total Protein: 6.7 g/dL (ref 6.5–8.1)

## 2019-12-11 LAB — LACTIC ACID, PLASMA: Lactic Acid, Venous: 0.6 mmol/L (ref 0.5–1.9)

## 2019-12-11 NOTE — ED Triage Notes (Signed)
Pt reports left lower leg cellulitis. Has been on abx for the same, seen recently for the same. Reports since going home yesterday he has had increased swelling and pain to the leg. Temp 101 today, says he does not know what time he took the tylenol.

## 2019-12-12 ENCOUNTER — Emergency Department
Admission: EM | Admit: 2019-12-12 | Discharge: 2019-12-12 | Disposition: A | Discharge status: Home / Self Care | Payer: Medicaid Other | Attending: Emergency Medicine | Admitting: Emergency Medicine

## 2019-12-12 NOTE — ED Notes (Signed)
No answer when called several times from lobby 

## 2019-12-12 NOTE — ED Notes (Signed)
No answer when called from lobby & cell phone 

## 2019-12-12 NOTE — ED Notes (Signed)
No answer when called from lobby and cell phone 

## 2019-12-15 LAB — CULTURE, BLOOD (ROUTINE X 2)
Culture: NO GROWTH
Culture: NO GROWTH
Special Requests: ADEQUATE
Special Requests: ADEQUATE

## 2019-12-23 ENCOUNTER — Ambulatory Visit: Payer: Medicaid Other | Attending: Internal Medicine

## 2019-12-23 DIAGNOSIS — Z23 Encounter for immunization: Secondary | ICD-10-CM

## 2019-12-23 NOTE — Progress Notes (Signed)
   Covid-19 Vaccination Clinic  Name:  Philip Owen    MRN: [id number] DOB: [birth year]  12/23/2019  [name] was observed post Covid-19 immunization for 15 minutes without incident. He was provided with Vaccine Information Sheet and instruction to access the V-Safe system.   [name] was instructed to call 911 with any severe reactions post vaccine: Marland Kitchen Difficulty breathing  . Swelling of face and throat  . A fast heartbeat  . A bad rash all over body  . Dizziness and weakness   Immunizations Administered    Name Date Dose VIS Date Route   Pfizer COVID-19 Vaccine 12/23/2019  2:03 PM 0.3 mL 06/26/2018 Intramuscular   Manufacturer: [employer], [employer]   Lot: [id number]   NDC: [address]-7

## 2020-01-13 ENCOUNTER — Ambulatory Visit: Payer: Medicaid Other

## 2020-04-29 ENCOUNTER — Other Ambulatory Visit: Payer: Self-pay

## 2020-04-29 ENCOUNTER — Emergency Department
Admission: EM | Admit: 2020-04-29 | Discharge: 2020-04-29 | Disposition: A | Discharge status: Home / Self Care | Payer: Medicaid Other | Attending: Emergency Medicine | Admitting: Emergency Medicine

## 2020-04-29 ENCOUNTER — Encounter: Payer: Self-pay | Admitting: Emergency Medicine

## 2020-04-29 DIAGNOSIS — Z5321 Procedure and treatment not carried out due to patient leaving prior to being seen by health care provider: Secondary | ICD-10-CM | POA: Insufficient documentation

## 2020-04-29 DIAGNOSIS — R079 Chest pain, unspecified: Secondary | ICD-10-CM | POA: Insufficient documentation

## 2020-04-29 DIAGNOSIS — R009 Unspecified abnormalities of heart beat: Secondary | ICD-10-CM | POA: Insufficient documentation

## 2020-04-29 LAB — BASIC METABOLIC PANEL
Anion gap: 11 (ref 5–15)
BUN: 20 mg/dL (ref 6–20)
CO2: 28 mmol/L (ref 22–32)
Calcium: 9.6 mg/dL (ref 8.9–10.3)
Chloride: 99 mmol/L (ref 98–111)
Creatinine, Ser: 1 mg/dL (ref 0.61–1.24)
GFR, Estimated: >60 mL/min (ref >60)
Glucose, Bld: 109 mg/dL — ABNORMAL HIGH (ref 70–99)
Potassium: 3.6 mmol/L (ref 3.5–5.1)
Sodium: 138 mmol/L (ref 135–145)

## 2020-04-29 LAB — CBC
HCT: 40 % (ref 39.0–52.0)
Hemoglobin: 13.7 g/dL (ref 13.0–17.0)
MCH: 28.8 pg (ref 26.0–34.0)
MCHC: 34.3 g/dL (ref 30.0–36.0)
MCV: 84 fL (ref 80.0–100.0)
Platelets: 326 10*3/uL (ref 150–400)
RBC: 4.76 MIL/uL (ref 4.22–5.81)
RDW: 12.6 % (ref 11.5–15.5)
WBC: 7.3 10*3/uL (ref 4.0–10.5)
nRBC: 0 % (ref 0.0–0.2)

## 2020-04-29 LAB — TROPONIN I (HIGH SENSITIVITY): Troponin I (High Sensitivity): 4 ng/L (ref <18)

## 2020-04-29 NOTE — ED Triage Notes (Signed)
Pt called for EKG, no response °

## 2020-04-29 NOTE — ED Triage Notes (Signed)
Pt reports cp that is sharp in nature to his left chest since 5 this am. Pt also reports it feels like his heart beats irregular.

## 2020-04-29 NOTE — ED Triage Notes (Signed)
Pt called x's 3, no response ?

## 2020-04-29 NOTE — ED Triage Notes (Signed)
Called for EKG, no response

## 2020-04-29 NOTE — ED Triage Notes (Signed)
Pt called from Wr to treatment room, no response ?

## 2020-04-29 NOTE — ED Triage Notes (Signed)
Pt called x's 2, no response 

## 2020-06-10 IMAGING — DX DG FINGER LITTLE 2+V*R*
3 series · 3 of 3 positions shown · non-contrast
Comparison: None.

CLINICAL DATA: Initial evaluation for acute laceration to right
fifth digit.

EXAM:
RIGHT LITTLE FINGER 2+V

[finger ap]
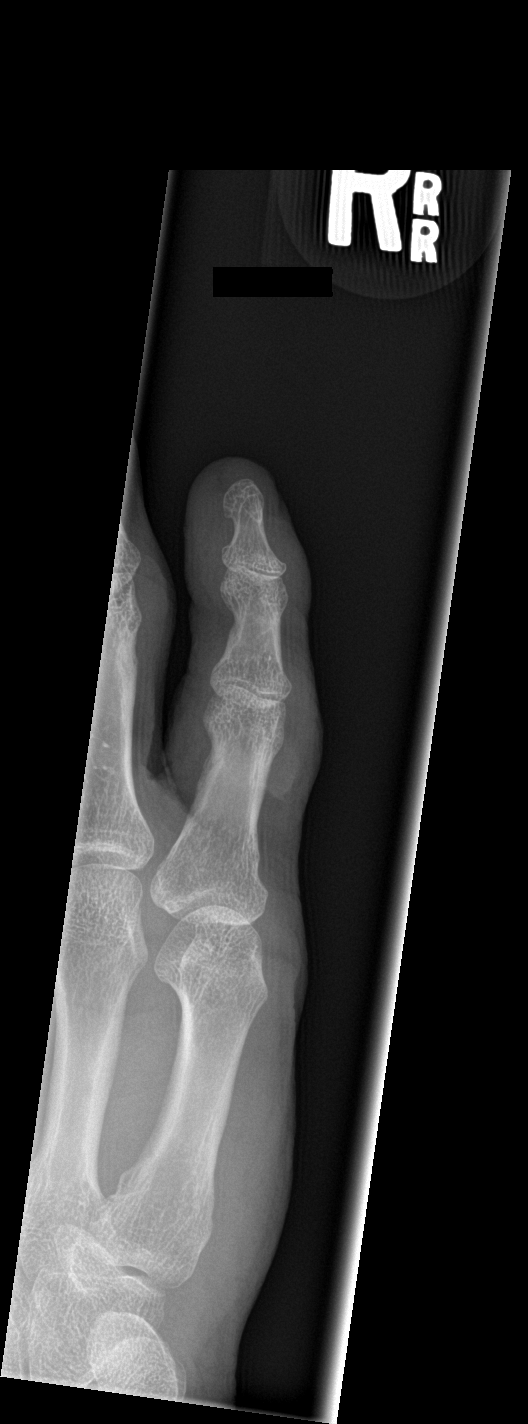

[finger obl]
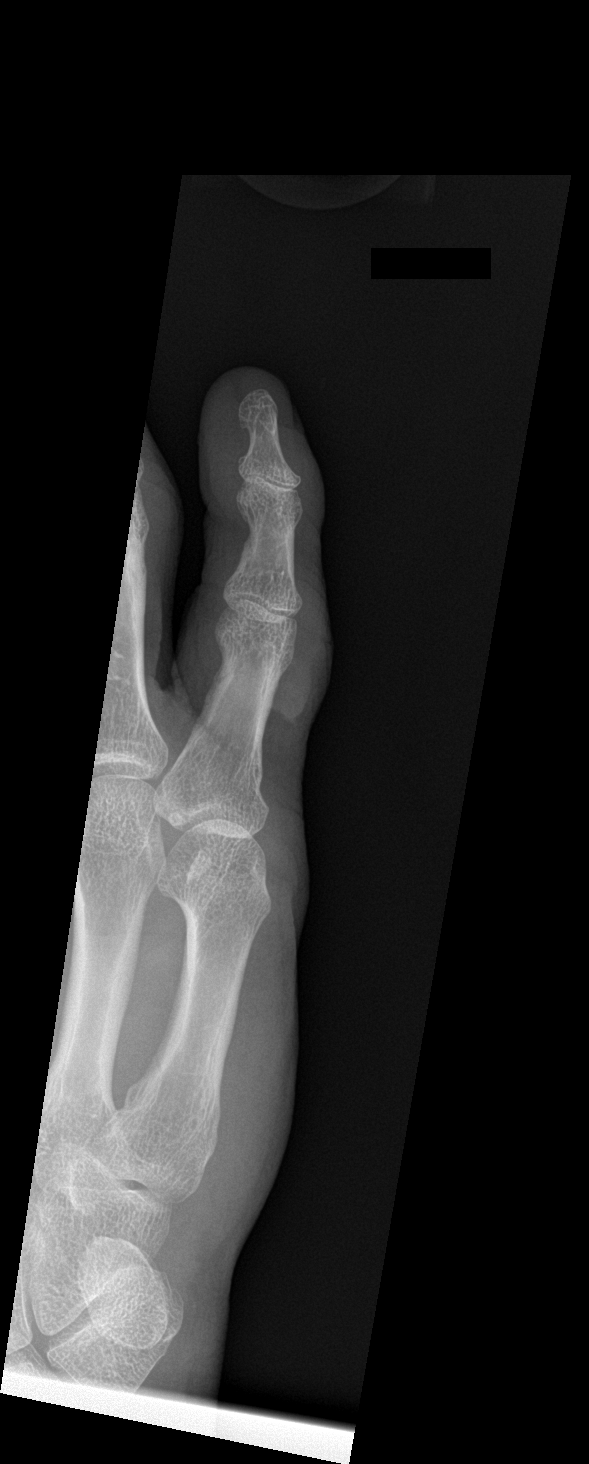

[finger lat]
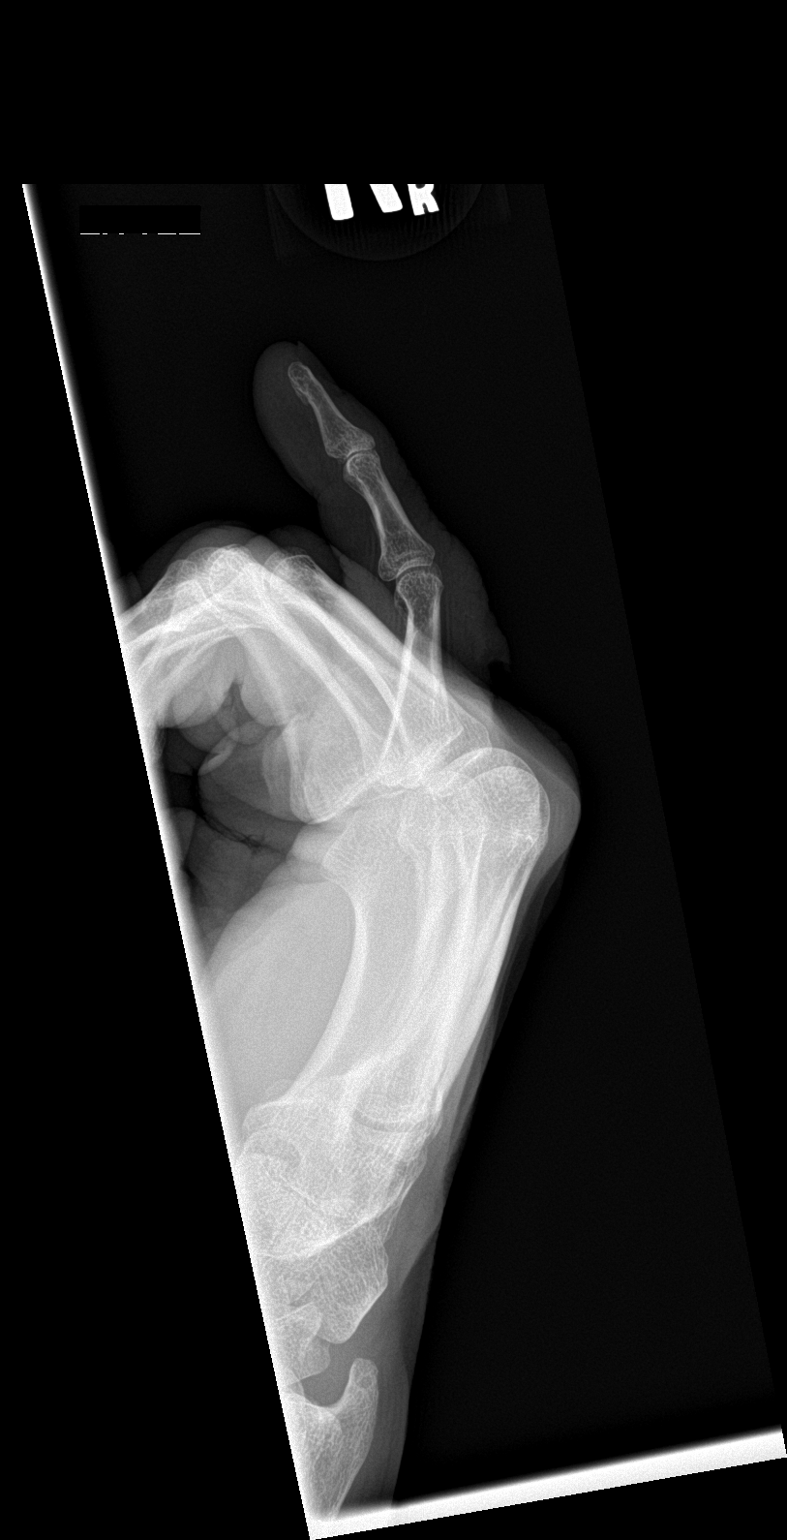

[3 of 3 positions shown; findings below may reference images not displayed]

FINDINGS: Soft tissue laceration at the proximal aspect of the right fifth
digit. No radiopaque foreign body identified. No acute fracture or
dislocation. Osseous mineralization normal.
IMPRESSION: 1. Soft tissue laceration at the proximal aspect of the right fifth
digit. No radiopaque foreign body.
2. No acute fracture or dislocation.

## 2020-06-12 IMAGING — CR DG CHEST 2V
2 series · 2 of 2 positions shown · non-contrast
Comparison: November 21, 2017

CLINICAL DATA: Shortness of Breath

EXAM:
CHEST - 2 VIEW

[chest lat]
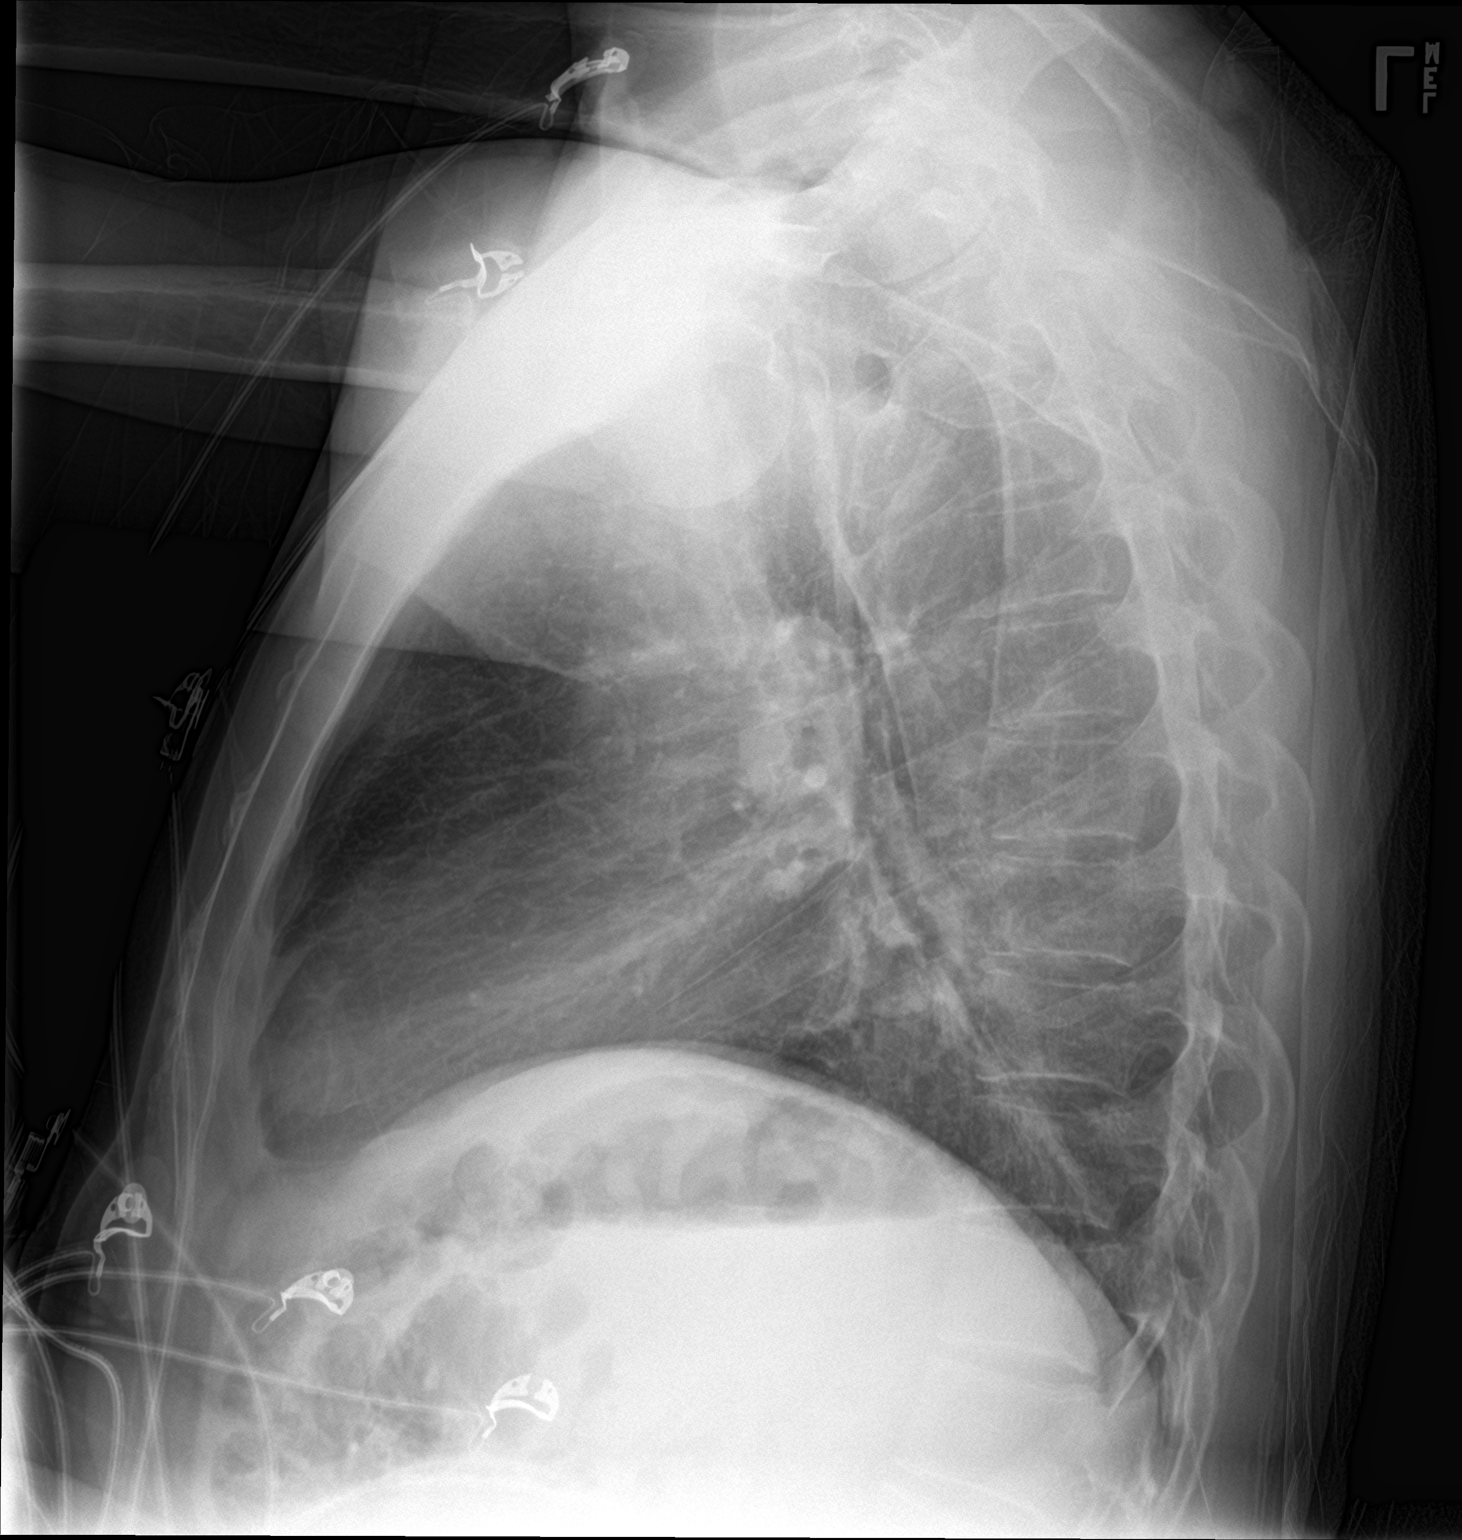

[chest ap]
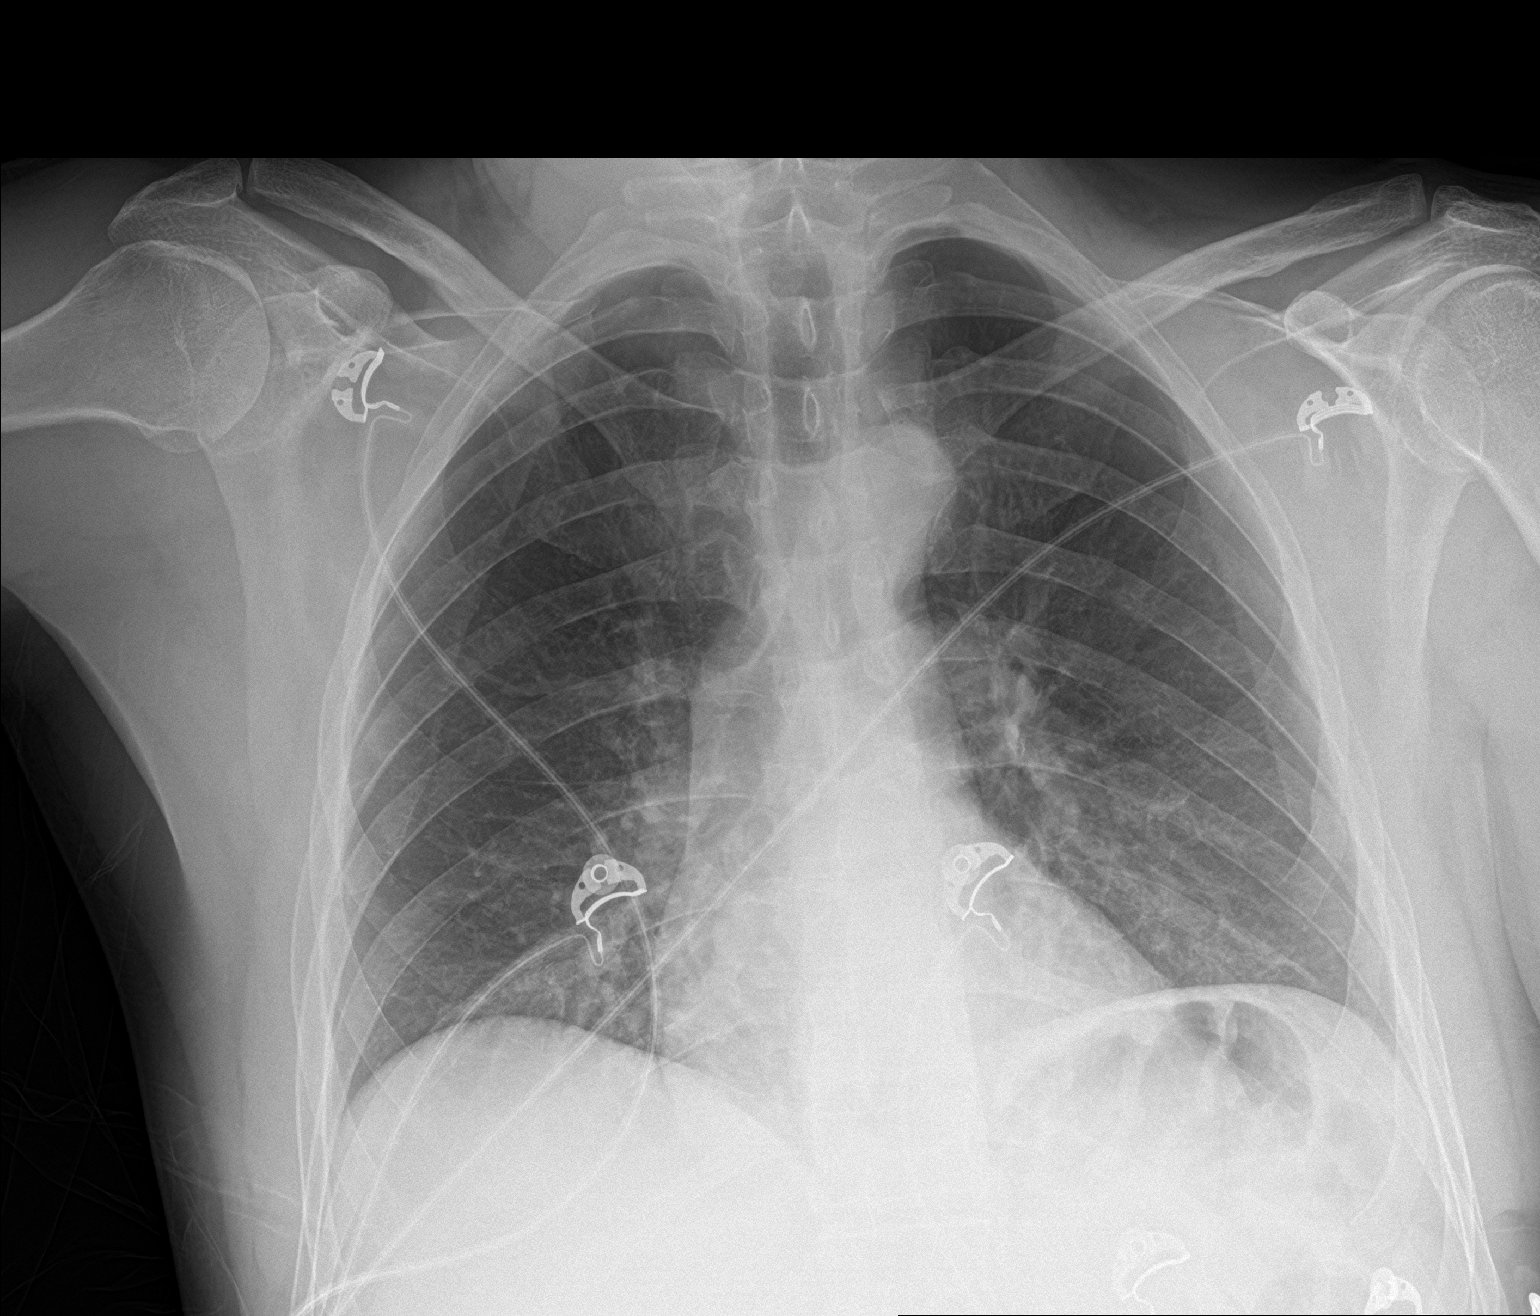

[2 of 2 positions shown; findings below may reference images not displayed]

FINDINGS: Lungs are clear. The heart size and pulmonary vascularity are
normal. No adenopathy. No bone lesions.
IMPRESSION: No edema or consolidation.

## 2020-11-16 ENCOUNTER — Emergency Department
Admission: EM | Admit: 2020-11-16 | Discharge: 2020-11-16 | Disposition: A | Discharge status: Home / Self Care | Payer: Medicaid Other | Attending: Emergency Medicine | Admitting: Emergency Medicine

## 2020-11-16 ENCOUNTER — Other Ambulatory Visit: Payer: Self-pay

## 2020-11-16 DIAGNOSIS — F17210 Nicotine dependence, cigarettes, uncomplicated: Secondary | ICD-10-CM | POA: Insufficient documentation

## 2020-11-16 DIAGNOSIS — H60501 Unspecified acute noninfective otitis externa, right ear: Secondary | ICD-10-CM | POA: Insufficient documentation

## 2020-11-16 MED ORDER — CIPROFLOXACIN-DEXAMETHASONE 0.3-0.1 % OT SUSP
4.0000 [drp] | Freq: Once | OTIC | Status: AC
  Administered 2020-11-16: 4 [drp] via OTIC
  Filled 2020-11-16: qty 7.5

## 2020-11-16 MED ORDER — CIPROFLOXACIN-DEXAMETHASONE 0.3-0.1 % OT SUSP
4.0000 [drp] | Freq: Two times a day (BID) | OTIC | Status: DC
  Filled 2020-11-16: qty 7.5

## 2020-11-16 NOTE — Discharge Instructions (Signed)
Apply 4 drops twice daily for the next seven days.

## 2020-11-16 NOTE — ED Triage Notes (Signed)
Pt comes with c/o right ear pain that started two days ago.

## 2020-11-16 NOTE — ED Notes (Signed)
Pharmacy notified about Cipro ear drops

## 2020-11-16 NOTE — ED Notes (Signed)
E signature pad not working. Pt educated on discharge instructions and verbalized understanding.  

## 2020-11-16 NOTE — ED Provider Notes (Signed)
[facility]-EMERGENCY DEPARTMENT  ____________________________________________  Time seen: Approximately 8:02 PM  I have reviewed the triage vital signs and the nursing notes.   HISTORY  Chief Complaint Otalgia   Historian Patient     HPI Philip Owen is a 45 y.o. male presents to the emergency department with acute right ear pain for the past 2 days.  Patient has reproducible pain with palpation of the right tragus.  No discharge from the right ear.  No fever or chills.  No similar symptoms in the past.   Past Medical History:  Diagnosis Date   Acid reflux    Anxiety    GERD (gastroesophageal reflux disease)      Immunizations up to date:  Yes.     Past Medical History:  Diagnosis Date   Acid reflux    Anxiety    GERD (gastroesophageal reflux disease)     Patient Active Problem List   Diagnosis Date Noted   Sepsis (HCC) 09/13/2019   Facial cellulitis 09/13/2019   AKI (acute kidney injury) (HCC) 09/13/2019   Pneumonia 05/22/2017    Past Surgical History:  Procedure Laterality Date   none      Prior to Admission medications   Medication Sig Start Date End Date Taking? Authorizing Provider  esomeprazole (NEXIUM 24HR) 20 MG capsule Take 20 mg by mouth daily at 12 noon.    [provider]    Allergies Tramadol and Naproxen  Family History  Problem Relation Age of Onset   Brain cancer Father     Social History Social History   Tobacco Use   Smoking status: Every Day    Packs/day: 0.50    Years: 20.00    Pack years: 10.00    Types: Cigarettes   Smokeless tobacco: Never  Vaping Use   Vaping Use: Never used  Substance Use Topics   Alcohol use: Yes    Comment: occ   Drug use: Not Currently    Types: Cocaine     Review of Systems  Constitutional: No fever/chills Eyes:  No discharge ENT: Patient has right ear pain.  Respiratory: no cough. No SOB/ use of accessory muscles to breath Gastrointestinal:   No nausea, no vomiting.   No diarrhea.  No constipation. Musculoskeletal: Negative for musculoskeletal pain. Skin: Negative for rash, abrasions, lacerations, ecchymosis.    ____________________________________________   PHYSICAL EXAM:  VITAL SIGNS: ED Triage Vitals  Enc Vitals Group     BP 11/16/20 1800 (!) 150/102     Pulse Rate 11/16/20 1800 88     Resp 11/16/20 1800 18     Temp 11/16/20 1800 97.6 F (36.4 C)     Temp src --      SpO2 11/16/20 1800 100 %     Weight --      Height --      Head Circumference --      Peak Flow --      Pain Score 11/16/20 1759 5     Pain Loc --      Pain Edu? --      Excl. in GC? --      Constitutional: Alert and oriented. Well appearing and in no acute distress. Eyes: Conjunctivae are normal. PERRL. EOMI. Head: Atraumatic. ENT:      Ears: Patient has pain with palpation of the right tragus.  Right TM is effused      Nose: No congestion/rhinnorhea.      Mouth/Throat: Mucous membranes are moist.  Neck: No stridor.  No cervical spine tenderness to palpation. Cardiovascular: Normal rate, regular rhythm. Normal S1 and S2.  Good peripheral circulation. Respiratory: Normal respiratory effort without tachypnea or retractions. Lungs CTAB. Good air entry to the bases with no decreased or absent breath sounds Gastrointestinal: Bowel sounds x 4 quadrants. Soft and nontender to palpation. No guarding or rigidity. No distention. Musculoskeletal: Full range of motion to all extremities. No obvious deformities noted Neurologic:  Normal for age. No gross focal neurologic deficits are appreciated.  Skin:  Skin is warm, dry and intact. No rash noted. Psychiatric: Mood and affect are normal for age. Speech and behavior are normal.   ____________________________________________   LABS (all labs ordered are listed, but only abnormal results are displayed)  Labs Reviewed - No data to  display ____________________________________________  EKG   ____________________________________________  RADIOLOGY  No results found.  ____________________________________________    PROCEDURES  Procedure(s) performed:     Procedures     Medications  ciprofloxacin-dexamethasone (CIPRODEX) 0.3-0.1 % OTIC (EAR) suspension 4 drop (has no administration in time range)     ____________________________________________   INITIAL IMPRESSION / ASSESSMENT AND PLAN / ED COURSE  Pertinent labs & imaging results that were available during my care of the patient were reviewed by me and considered in my medical decision making (see chart for details).    Assessment and Plan:  Otitis externa 45 year old male presents to the emergency department with acute right ear pain for the past 2 days.  Ear pain was reproducible with palpation of the right tragus.  Recommended Ciprodex, 4 drops twice daily for the next 7 days.      ____________________________________________  FINAL CLINICAL IMPRESSION(S) / ED DIAGNOSES  Final diagnoses:  Acute otitis externa of right ear, unspecified type      NEW MEDICATIONS STARTED DURING THIS VISIT:  ED Discharge Orders     None           This chart was dictated using voice recognition software/Dragon. Despite best efforts to proofread, errors can occur which can change the meaning. Any change was purely unintentional.     Gasper Lloyd 11/16/20 2029    Delton Prairie, MD 11/17/20 0000

## 2020-11-18 ENCOUNTER — Other Ambulatory Visit: Payer: Self-pay

## 2020-11-18 ENCOUNTER — Emergency Department
Admission: EM | Admit: 2020-11-18 | Discharge: 2020-11-18 | Disposition: A | Discharge status: Home / Self Care | Payer: Medicaid Other | Attending: Emergency Medicine | Admitting: Emergency Medicine

## 2020-11-18 DIAGNOSIS — T50901A Poisoning by unspecified drugs, medicaments and biological substances, accidental (unintentional), initial encounter: Secondary | ICD-10-CM

## 2020-11-18 DIAGNOSIS — F17210 Nicotine dependence, cigarettes, uncomplicated: Secondary | ICD-10-CM | POA: Insufficient documentation

## 2020-11-18 DIAGNOSIS — T39311A Poisoning by propionic acid derivatives, accidental (unintentional), initial encounter: Secondary | ICD-10-CM | POA: Insufficient documentation

## 2020-11-18 LAB — CBC WITH DIFFERENTIAL/PLATELET
Abs Immature Granulocytes: 0.03 10*3/uL (ref 0.00–0.07)
Basophils Absolute: 0 10*3/uL (ref 0.0–0.1)
Basophils Relative: 0 %
Eosinophils Absolute: 0.1 10*3/uL (ref 0.0–0.5)
Eosinophils Relative: 1 %
HCT: 38.1 % — ABNORMAL LOW (ref 39.0–52.0)
Hemoglobin: 12.9 g/dL — ABNORMAL LOW (ref 13.0–17.0)
Immature Granulocytes: 0 %
Lymphocytes Relative: 22 %
Lymphs Abs: 1.9 10*3/uL (ref 0.7–4.0)
MCH: 29.3 pg (ref 26.0–34.0)
MCHC: 33.9 g/dL (ref 30.0–36.0)
MCV: 86.4 fL (ref 80.0–100.0)
Monocytes Absolute: 0.6 10*3/uL (ref 0.1–1.0)
Monocytes Relative: 7 %
Neutro Abs: 6.3 10*3/uL (ref 1.7–7.7)
Neutrophils Relative %: 70 %
Platelets: 307 10*3/uL (ref 150–400)
RBC: 4.41 MIL/uL (ref 4.22–5.81)
RDW: 12.8 % (ref 11.5–15.5)
WBC: 9 10*3/uL (ref 4.0–10.5)
nRBC: 0 % (ref 0.0–0.2)

## 2020-11-18 LAB — SALICYLATE LEVEL: Salicylate Lvl: <7 mg/dL — ABNORMAL LOW (ref 7.0–30.0)

## 2020-11-18 LAB — COMPREHENSIVE METABOLIC PANEL
ALT: 28 U/L (ref 0–44)
AST: 30 U/L (ref 15–41)
Albumin: 4.5 g/dL (ref 3.5–5.0)
Alkaline Phosphatase: 69 U/L (ref 38–126)
Anion gap: 7 (ref 5–15)
BUN: 23 mg/dL — ABNORMAL HIGH (ref 6–20)
CO2: 27 mmol/L (ref 22–32)
Calcium: 9.1 mg/dL (ref 8.9–10.3)
Chloride: 108 mmol/L (ref 98–111)
Creatinine, Ser: 0.98 mg/dL (ref 0.61–1.24)
GFR, Estimated: >60 mL/min (ref >60)
Glucose, Bld: 111 mg/dL — ABNORMAL HIGH (ref 70–99)
Potassium: 3.7 mmol/L (ref 3.5–5.1)
Sodium: 142 mmol/L (ref 135–145)
Total Bilirubin: 0.7 mg/dL (ref 0.3–1.2)
Total Protein: 7.2 g/dL (ref 6.5–8.1)

## 2020-11-18 LAB — ACETAMINOPHEN LEVEL: Acetaminophen (Tylenol), Serum: <10 ug/mL — ABNORMAL LOW (ref 10–30)

## 2020-11-18 LAB — ETHANOL: Alcohol, Ethyl (B): <10 mg/dL (ref <10)

## 2020-11-18 NOTE — ED Triage Notes (Signed)
Patient presents to the ED after taking 4 600mg  Gabapentin. Patient alert, oriented x4, but drowsy. Patient denies any pain or needs at this time.

## 2020-11-18 NOTE — ED Provider Notes (Signed)
[facility] Emergency Department Provider Note  ____________________________________________   Event Date/Time   First MD Initiated Contact with Patient 11/18/20 2113     (approximate)  I have reviewed the triage vital signs and the nursing notes.   HISTORY  Chief Complaint Drug Overdose   HPI Philip Owen is a 45 y.o. male with a past medical history of anxiety and GERD who presents EMS for assessment of some sleepiness after reportedly took 4 tablets of 600 mg ibuprofen early this afternoon..  On arrival patient states he initially felt little sleepy but now feels back to himself and no longer sleepy.  States he was try to get a little high and adamantly denies any thoughts of wanting to harm himself.  He endorses smoking THC but denies any other drug or alcohol use.  States he currently has no symptoms and has not had any recent headache, earache, sore throat, nausea, vomiting, diarrhea, dysuria, rash or recent falls or injuries.  He denies any visual auditory hallucinations adamantly denies any SI or HI.  States he feels ready to go home and will be staying at his mother's we will plan to call to pick him up.  No other acute concerns at this time.         Past Medical History:  Diagnosis Date   Acid reflux    Anxiety    GERD (gastroesophageal reflux disease)     Patient Active Problem List   Diagnosis Date Noted   Sepsis (HCC) 09/13/2019   Facial cellulitis 09/13/2019   AKI (acute kidney injury) (HCC) 09/13/2019   Pneumonia 05/22/2017    Past Surgical History:  Procedure Laterality Date   none      Prior to Admission medications   Medication Sig Start Date End Date Taking? Authorizing Provider  esomeprazole (NEXIUM 24HR) 20 MG capsule Take 20 mg by mouth daily at 12 noon.    [provider]    Allergies Tramadol and Naproxen  Family History  Problem Relation Age of Onset   Brain cancer Father     Social  History Social History   Tobacco Use   Smoking status: Every Day    Packs/day: 0.50    Years: 20.00    Pack years: 10.00    Types: Cigarettes   Smokeless tobacco: Never  Vaping Use   Vaping Use: Never used  Substance Use Topics   Alcohol use: Yes    Comment: occ   Drug use: Not Currently    Types: Cocaine    Review of Systems  Review of Systems  Constitutional:  Negative for chills and fever.  HENT:  Negative for sore throat.   Eyes:  Negative for pain.  Respiratory:  Negative for cough and stridor.   Cardiovascular:  Negative for chest pain.  Gastrointestinal:  Negative for vomiting.  Genitourinary:  Negative for dysuria.  Musculoskeletal:  Negative for myalgias.  Skin:  Negative for rash.  Neurological:  Negative for seizures, loss of consciousness and headaches.  Psychiatric/Behavioral:  Negative for suicidal ideas.   All other systems reviewed and are negative.    ____________________________________________   PHYSICAL EXAM:  VITAL SIGNS: ED Triage Vitals  Enc Vitals Group     BP 11/18/20 1843 (!) 144/101     Pulse Rate 11/18/20 1843 90     Resp 11/18/20 1843 14     Temp 11/18/20 1843 98 F (36.7 C)     Temp Source 11/18/20 1843 Oral  SpO2 11/18/20 1843 99 %     Weight 11/18/20 1844 145 lb 8.1 oz (66 kg)     Height 11/18/20 1844 5\' 8"  (1.727 m)     Head Circumference --      Peak Flow --      Pain Score 11/18/20 1843 0     Pain Loc --      Pain Edu? --      Excl. in GC? --    Vitals:   11/18/20 1843  BP: (!) 144/101  Pulse: 90  Resp: 14  Temp: 98 F (36.7 C)  SpO2: 99%   Physical Exam Vitals and nursing note reviewed.  Constitutional:      Appearance: He is well-developed.  HENT:     Head: Normocephalic and atraumatic.     Right Ear: External ear normal.     Left Ear: External ear normal.     Nose: Nose normal.     Mouth/Throat:     Mouth: Mucous membranes are moist.  Eyes:     Conjunctiva/sclera: Conjunctivae normal.   Cardiovascular:     Rate and Rhythm: Normal rate and regular rhythm.     Heart sounds: No murmur heard. Pulmonary:     Effort: Pulmonary effort is normal. No respiratory distress.     Breath sounds: Normal breath sounds.  Abdominal:     Palpations: Abdomen is soft.     Tenderness: There is no abdominal tenderness.  Musculoskeletal:     Cervical back: Neck supple.     Right lower leg: No edema.     Left lower leg: No edema.  Skin:    General: Skin is warm and dry.     Capillary Refill: Capillary refill takes less than 2 seconds.  Neurological:     Mental Status: He is alert and oriented to person, place, and time.  Psychiatric:        Mood and Affect: Mood normal.  .   Cranial nerves II through grossly intact.  Patient has symmetric strength in his upper and lower extremities.  No evidence of trauma exam.  Patient was observed to ambulate approximately 30 feet without ataxia or difficulty unassisted. ____________________________________________   LABS (all labs ordered are listed, but only abnormal results are displayed)  Labs Reviewed  COMPREHENSIVE METABOLIC PANEL - Abnormal; Notable for the following components:      Result Value   Glucose, Bld 111 (*)    BUN 23 (*)    All other components within normal limits  SALICYLATE LEVEL - Abnormal; Notable for the following components:   Salicylate Lvl <7.0 (*)    All other components within normal limits  ACETAMINOPHEN LEVEL - Abnormal; Notable for the following components:   Acetaminophen (Tylenol), Serum <10 (*)    All other components within normal limits  CBC WITH DIFFERENTIAL/PLATELET - Abnormal; Notable for the following components:   Hemoglobin 12.9 (*)    HCT 38.1 (*)    All other components within normal limits  ETHANOL  URINE DRUG SCREEN, QUALITATIVE ([facility] ONLY)   ____________________________________________  EKG  Sinus rhythm with a ventricular of 74, normal axis, unremarkable intervals without evidence of  acute ischemia or significant arrhythmia. ____________________________________________  RADIOLOGY  ED MD interpretation:    Official radiology report(s): No results found.  ____________________________________________   PROCEDURES  Procedure(s) performed (including Critical Care):  Procedures   ____________________________________________   INITIAL IMPRESSION / ASSESSMENT AND PLAN / ED COURSE      Patient presents with above  to history exam for assessment after reported accidental overdose of some gabapentin he was taking to try to get high.  On arrival he does not appear acutely intoxicated and is able to ambulate.  His speech is not slurred and he is nonfocal neuro exam.  He denies any other acute concerns or any intent to harm himself.  I have a low suspicion for significant metabolic derangement as initial screen labs obtained in triage including CBC, CMP acetaminophen and salicylate are unremarkable.  ECG shows no significant arrhythmia or elongation of intervals.  He denies any pain and I have a low suspicion for occult trauma or intentional overdose.  Given he has been in the emergency room for approximately 3 and half hours without any loss of consciousness and denying any symptoms with stable vitals otherwise reassuring exam work-up I think he is stable for outpatient follow-up.  Counseled on dangers of taking prescribed medicines and illicit drugs.  Discharged stable condition.  Strict return cautions advised and discussed.        ____________________________________________   FINAL CLINICAL IMPRESSION(S) / ED DIAGNOSES  Final diagnoses:  Accidental drug overdose, initial encounter    Medications - No data to display   ED Discharge Orders     None        Note:  This document was prepared using Dragon voice recognition software and may include unintentional dictation errors.    Gilles Chiquito, MD 11/18/20 2123

## 2020-11-18 NOTE — ED Notes (Signed)
Pt d/c'd by physician prior to RN assessment.

## 2020-11-18 NOTE — ED Triage Notes (Signed)
ARrives via [facility] for overdose.  Took 4 tabs of 600 mg Gabaentin.  AAOx3.  Skin warm and dry. NAD

## 2020-11-25 ENCOUNTER — Other Ambulatory Visit: Payer: Self-pay

## 2020-11-25 ENCOUNTER — Emergency Department: Payer: Medicaid Other

## 2020-11-25 ENCOUNTER — Emergency Department
Admission: EM | Admit: 2020-11-25 | Discharge: 2020-11-25 | Disposition: A | Discharge status: Home / Self Care | Payer: Medicaid Other | Attending: Student in an Organized Health Care Education/Training Program | Admitting: Student in an Organized Health Care Education/Training Program

## 2020-11-25 DIAGNOSIS — Z5321 Procedure and treatment not carried out due to patient leaving prior to being seen by health care provider: Secondary | ICD-10-CM | POA: Insufficient documentation

## 2020-11-25 DIAGNOSIS — R2231 Localized swelling, mass and lump, right upper limb: Secondary | ICD-10-CM | POA: Insufficient documentation

## 2020-11-25 NOTE — ED Notes (Addendum)
Pt called from [facility] and [facility] with no answer. Shanda Bumps, RN made aware that pt did not answer. Will try again.

## 2020-11-25 NOTE — ED Notes (Signed)
Called pt on cell phone. Phone rung twice and was then ignored. Will try to call pt once again.

## 2020-11-25 NOTE — ED Triage Notes (Signed)
Pt to ED for swelling to right hand for a few days. Denies injury. States he might have gotten bit by something.  NAD noted. VSS.  Minimal swelling noted

## 2021-01-01 ENCOUNTER — Emergency Department
Admission: EM | Admit: 2021-01-01 | Discharge: 2021-01-03 | Disposition: A | Discharge status: Home / Self Care | Payer: Medicaid Other | Attending: Emergency Medicine | Admitting: Emergency Medicine

## 2021-01-01 ENCOUNTER — Other Ambulatory Visit: Payer: Self-pay

## 2021-01-01 ENCOUNTER — Encounter: Payer: Self-pay | Admitting: Emergency Medicine

## 2021-01-01 DIAGNOSIS — F17210 Nicotine dependence, cigarettes, uncomplicated: Secondary | ICD-10-CM | POA: Insufficient documentation

## 2021-01-01 DIAGNOSIS — Y939 Activity, unspecified: Secondary | ICD-10-CM | POA: Insufficient documentation

## 2021-01-01 DIAGNOSIS — M7062 Trochanteric bursitis, left hip: Secondary | ICD-10-CM | POA: Insufficient documentation

## 2021-01-01 MED ORDER — PREDNISONE 10 MG PO TABS
10.0000 mg | ORAL_TABLET | Freq: Every day | ORAL | 0 refills | Status: DC
Start: 2021-01-01 — End: 2021-02-19

## 2021-01-01 NOTE — ED Provider Notes (Signed)
[facility] EMERGENCY DEPARTMENT Provider Note   CSN: [id number] Arrival date & time: 01/01/21  1557     History Chief Complaint  Patient presents with   Leg Pain    Philip Owen is a 45 y.o. male.  Presents to the emergency department for evaluation of left lateral hip pain.  He points to the trochanteric bursa.  Describes a aching pain with no burning numbness tingling or radicular symptoms.  Symptoms have been present for 2 weeks.  He denies any swelling in the lower extremities.  He has no history of blood clots.  He has not take any medications for pain.  He is able to walk but has pain with walking and he also reports pain with lying on his hip at nighttime.  He denies any back pain, abdominal pain or urinary symptoms.  No fevers or chills.  HPI     Past Medical History:  Diagnosis Date   Acid reflux    Anxiety    GERD (gastroesophageal reflux disease)     Patient Active Problem List   Diagnosis Date Noted   Sepsis (HCC) 09/13/2019   Facial cellulitis 09/13/2019   AKI (acute kidney injury) (HCC) 09/13/2019   Pneumonia 05/22/2017    Past Surgical History:  Procedure Laterality Date   none         Family History  Problem Relation Age of Onset   Brain cancer Father     Social History   Tobacco Use   Smoking status: Every Day    Packs/day: 0.50    Years: 20.00    Pack years: 10.00    Types: Cigarettes   Smokeless tobacco: Never  Vaping Use   Vaping Use: Never used  Substance Use Topics   Alcohol use: Yes    Comment: occ   Drug use: Not Currently    Types: Cocaine    Home Medications Prior to Admission medications   Medication Sig Start Date End Date Taking? Authorizing Provider  predniSONE (DELTASONE) 10 MG tablet Take 1 tablet (10 mg total) by mouth daily. 6,5,4,3,2,1 six day taper 01/01/21  Yes Evon Slack, PA-C  esomeprazole (NEXIUM 24HR) 20 MG capsule Take 20 mg by mouth daily at 12 noon.    [provider]    Allergies    Tramadol and Naproxen  Review of Systems   Review of Systems  Constitutional:  Negative for chills and fever.  Respiratory:  Negative for cough and shortness of breath.   Gastrointestinal:  Negative for abdominal pain, nausea and vomiting.  Genitourinary:  Negative for dysuria.  Musculoskeletal:  Positive for arthralgias and myalgias. Negative for back pain, gait problem, joint swelling, neck pain and neck stiffness.  Skin:  Negative for wound.  Neurological:  Negative for dizziness and headaches.   Physical Exam Updated Vital Signs BP 138/90   Pulse 99   Temp 98.8 F (37.1 C) (Oral)   Resp 18   Ht 5\' 8"  (1.727 m)   Wt 70.3 kg   SpO2 100%   BMI 23.57 kg/m   Physical Exam Constitutional:      Appearance: He is well-developed.  HENT:     Head: Normocephalic and atraumatic.  Eyes:     Conjunctiva/sclera: Conjunctivae normal.  Cardiovascular:     Rate and Rhythm: Normal rate.  Pulmonary:     Effort: Pulmonary effort is normal. No respiratory distress.  Abdominal:     General: There is no distension.  Tenderness: There is no abdominal tenderness.  Musculoskeletal:        General: Normal range of motion.     Cervical back: Normal range of motion.     Comments: No lumbar spinous process tenderness.  Patient is point tenderness along the left hip trochanteric bursa with no swelling warmth or erythema.  He has full range of motion of the left hip with internal and external rotation with no discomfort.  Patient has no pain with hip flexion.  He is able to stand and ambulate with no antalgic gait.  No tenderness, swelling warmth erythema or effusion throughout the knee and tib-fib region.  No swelling or edema.  Negative Homans' sign.  He is neurovascular tact in left lower extremity.  Skin:    General: Skin is warm.     Findings: No rash.  Neurological:     Mental Status: He is alert and oriented to person, place, and time.  Psychiatric:         Behavior: Behavior normal.        Thought Content: Thought content normal.    ED Results / Procedures / Treatments   Labs (all labs ordered are listed, but only abnormal results are displayed) Labs Reviewed - No data to display  EKG None  Radiology No results found.  Procedures Procedures   Medications Ordered in ED Medications - No data to display  ED Course  I have reviewed the triage vital signs and the nursing notes.  Pertinent labs & imaging results that were available during my care of the patient were reviewed by me and considered in my medical decision making (see chart for details).    MDM Rules/Calculators/A&P                         46 year old male with left hip trochanteric bursitis.  He is ambulatory, no reports of trauma or injury.  Full range of motion of the hip with no discomfort, do not suspect any significant hip arthropathy.  Vital signs are stable, afebrile.  No signs of blood clot.  Patient has allergy to NSAIDs, will try steroid taper.  He is given information on stretching exercises and will follow with orthopedics in 1 week if no relief. Final Clinical Impression(s) / ED Diagnoses Final diagnoses:  Greater trochanteric bursitis of left hip    Rx / DC Orders ED Discharge Orders          Ordered    predniSONE (DELTASONE) 10 MG tablet  Daily        01/01/21 1651             Ronnette Juniper 01/01/21 1701    Shaune Pollack, MD 01/01/21 2146

## 2021-01-01 NOTE — Discharge Instructions (Addendum)
Please apply heating pad to the left hip.  Perform stretching exercises daily.  Take medication as prescribed and follow-up in 1 week with orthopedics if no relief.  Return to the ER for any worsening symptoms or urgent changes in your health.

## 2021-01-01 NOTE — ED Triage Notes (Signed)
Patient presents ambulatory c/o left upper leg/ thigh pain x 2 weeks. Denies any injuries. Describes it as feeling achy. Denies any long travel. Smokes.

## 2021-02-19 ENCOUNTER — Emergency Department
Admission: EM | Admit: 2021-02-19 | Discharge: 2021-02-19 | Disposition: A | Discharge status: Home / Self Care | Payer: Medicaid Other | Attending: Emergency Medicine | Admitting: Emergency Medicine

## 2021-02-19 ENCOUNTER — Other Ambulatory Visit: Payer: Self-pay

## 2021-02-19 ENCOUNTER — Emergency Department: Payer: Medicaid Other

## 2021-02-19 DIAGNOSIS — M76892 Other specified enthesopathies of left lower limb, excluding foot: Secondary | ICD-10-CM | POA: Insufficient documentation

## 2021-02-19 DIAGNOSIS — Y9389 Activity, other specified: Secondary | ICD-10-CM | POA: Insufficient documentation

## 2021-02-19 DIAGNOSIS — M79605 Pain in left leg: Secondary | ICD-10-CM | POA: Insufficient documentation

## 2021-02-19 DIAGNOSIS — F17210 Nicotine dependence, cigarettes, uncomplicated: Secondary | ICD-10-CM | POA: Insufficient documentation

## 2021-02-19 MED ORDER — MELOXICAM 15 MG PO TABS: 15.0000 mg | ORAL_TABLET | Freq: Every day | ORAL | 0 refills | Status: DC

## 2021-02-19 MED ORDER — PREDNISONE 10 MG PO TABS: 10.0000 mg | ORAL_TABLET | ORAL | 0 refills | Status: DC

## 2021-02-19 MED ORDER — PREDNISONE 20 MG PO TABS
60.0000 mg | ORAL_TABLET | Freq: Once | ORAL | Status: AC
  Administered 2021-02-19: 60 mg via ORAL
  Filled 2021-02-19: qty 3

## 2021-02-19 MED ORDER — MELOXICAM 7.5 MG PO TABS
15.0000 mg | ORAL_TABLET | Freq: Once | ORAL | Status: AC
  Administered 2021-02-19: 15 mg via ORAL
  Filled 2021-02-19: qty 2

## 2021-02-19 NOTE — ED Provider Notes (Signed)
[facility] Emergency Department Provider Note  ____________________________________________  Time seen: Approximately 3:16 PM  I have reviewed the triage vital signs and the nursing notes.   HISTORY  Chief Complaint Leg Pain    HPI Philip Owen is a 45 y.o. male who presents the emergency department complaining of ongoing left hip pain.  This is along the anterior hip along the iliac crest.  Patient is point specific tender there.  It does hurt at rest, does hurt worse with movement.  No injury precipitating this.  Symptoms have been going on greater than 2 months.  He saw home this department, was placed on a short round of steroids which did improve symptoms but symptoms came back.  Patient did not follow-up with orthopedics.  No numbness or tingling.  No edema, erythema.       Past Medical History:  Diagnosis Date   Acid reflux    Anxiety    GERD (gastroesophageal reflux disease)     Patient Active Problem List   Diagnosis Date Noted   Sepsis (HCC) 09/13/2019   Facial cellulitis 09/13/2019   AKI (acute kidney injury) (HCC) 09/13/2019   Pneumonia 05/22/2017    Past Surgical History:  Procedure Laterality Date   none      Prior to Admission medications   Medication Sig Start Date End Date Taking? Authorizing Provider  meloxicam (MOBIC) 15 MG tablet Take 1 tablet (15 mg total) by mouth daily. 02/19/21  Yes Yesly Gerety, Delorise Royals, PA-C  predniSONE (DELTASONE) 10 MG tablet Take 1 tablet (10 mg total) by mouth as directed. 02/19/21  Yes Khamil Lamica, Delorise Royals, PA-C  esomeprazole (NEXIUM 24HR) 20 MG capsule Take 20 mg by mouth daily at 12 noon.    [provider]    Allergies Tramadol and Naproxen  Family History  Problem Relation Age of Onset   Brain cancer Father     Social History Social History   Tobacco Use   Smoking status: Every Day    Packs/day: 0.50    Years: 20.00    Pack years: 10.00    Types: Cigarettes    Smokeless tobacco: Never  Vaping Use   Vaping Use: Never used  Substance Use Topics   Alcohol use: Yes    Comment: occ   Drug use: Not Currently    Types: Cocaine     Review of Systems  Constitutional: No fever/chills Eyes: No visual changes. No discharge ENT: No upper respiratory complaints. Cardiovascular: no chest pain. Respiratory: no cough. No SOB. Gastrointestinal: No abdominal pain.  No nausea, no vomiting.  No diarrhea.  No constipation. Musculoskeletal: Anterior left hip pain Skin: Negative for rash, abrasions, lacerations, ecchymosis. Neurological: Negative for headaches, focal weakness or numbness.  10 System ROS otherwise negative.  ____________________________________________   PHYSICAL EXAM:  VITAL SIGNS: ED Triage Vitals  Enc Vitals Group     BP 02/19/21 1354 (!) 151/96     Pulse Rate 02/19/21 1354 95     Resp 02/19/21 1354 16     Temp 02/19/21 1354 98.3 F (36.8 C)     Temp Source 02/19/21 1354 Oral     SpO2 02/19/21 1354 97 %     Weight 02/19/21 1355 155 lb (70.3 kg)     Height 02/19/21 1355 5\' 8"  (1.727 m)     Head Circumference --      Peak Flow --      Pain Score 02/19/21 1354 7     Pain Loc --  Pain Edu? --      Excl. in GC? --      Constitutional: Alert and oriented. Well appearing and in no acute distress. Eyes: Conjunctivae are normal. PERRL. EOMI. Head: Atraumatic. ENT:      Ears:       Nose: No congestion/rhinnorhea.      Mouth/Throat: Mucous membranes are moist.  Neck: No stridor.    Cardiovascular: Normal rate, regular rhythm. Normal S1 and S2.  Good peripheral circulation. Respiratory: Normal respiratory effort without tachypnea or retractions. Lungs CTAB. Good air entry to the bases with no decreased or absent breath sounds. Musculoskeletal: Full range of motion to all extremities. No gross deformities appreciated.  Visualization of the left hip reveals no obvious deformity.  Full range of motion is preserved.  Patient is  tender points specifically over the anterior iliac crest.  There is no palpable abnormality.  No other tenderness to the hip or left lower extremity.  Dorsalis pedis pulses sensation intact distally. Neurologic:  Normal speech and language. No gross focal neurologic deficits are appreciated.  Skin:  Skin is warm, dry and intact. No rash noted. Psychiatric: Mood and affect are normal. Speech and behavior are normal. Patient exhibits appropriate insight and judgement.   ____________________________________________   LABS (all labs ordered are listed, but only abnormal results are displayed)  Labs Reviewed - No data to display ____________________________________________  EKG   ____________________________________________  RADIOLOGY I personally viewed and evaluated these images as part of my medical decision making, as well as reviewing the written report by the radiologist.  ED Provider Interpretation: No acute findings on x-ray  DG Hip Unilat W or Wo Pelvis 2-3 Views Left  Result Date: 02/19/2021 CLINICAL DATA:  Nontraumatic hip pain 2 weeks EXAM: DG HIP (WITH OR WITHOUT PELVIS) 2-3V LEFT COMPARISON:  None. FINDINGS: There is no evidence of hip fracture or dislocation. There is no evidence of arthropathy or other focal bone abnormality. IMPRESSION: Negative. Electronically Signed   By: Marlan Palau M.D.   On: 02/19/2021 14:35    ____________________________________________    PROCEDURES  Procedure(s) performed:    Procedures    Medications  predniSONE (DELTASONE) tablet 60 mg (has no administration in time range)  meloxicam (MOBIC) tablet 15 mg (has no administration in time range)     ____________________________________________   INITIAL IMPRESSION / ASSESSMENT AND PLAN / ED COURSE  Pertinent labs & imaging results that were available during my care of the patient were reviewed by me and considered in my medical decision making (see chart for  details).  Review of the [place] CSRS was performed in accordance of the [facility] prior to dispensing any controlled drugs.           Patient's diagnosis is consistent with tendinitis of the hip.  Patient presented to the emergency department complaining of left hip pain.  Patient has point specific tenderness over the iliac crest.  No palpable abnormality.  No trauma.  X-ray was reassuring.  At this time patient will be placed on anti-inflammatory and prednisone.  He is allergic to naproxen but can take other anti-inflammatories.  As such we will put him on meloxicam and a 12-day taper of prednisone.  Follow-up with orthopedics if symptoms or not improving..  Patient is given ED precautions to return to the ED for any worsening or new symptoms.     ____________________________________________  FINAL CLINICAL IMPRESSION(S) / ED DIAGNOSES  Final diagnoses:  Tendinitis of left hip  NEW MEDICATIONS STARTED DURING THIS VISIT:  ED Discharge Orders          Ordered    meloxicam (MOBIC) 15 MG tablet  Daily        02/19/21 1552    predniSONE (DELTASONE) 10 MG tablet  As directed       Note to Pharmacy: Take on a pattern of 6, 6, 5, 5, 4, 4, 3, 3, 2, 2, 1, 1   02/19/21 1552                This chart was dictated using voice recognition software/Dragon. Despite best efforts to proofread, errors can occur which can change the meaning. Any change was purely unintentional.    Racheal Patches, PA-C 02/19/21 1552    Sharman Cheek, MD 02/21/21 [phone]

## 2021-02-19 NOTE — ED Triage Notes (Signed)
Pt states that he has been having pain in the left leg for over a month ago, states that he was diagnosed with bursitis and states that it isn't any better

## 2021-02-19 NOTE — ED Provider Notes (Signed)
Emergency Medicine Provider Triage Evaluation Note  Philip Owen , a 45 y.o. male  was evaluated in triage.  Pt complains of left hip pain x 2 months. No known injury.  Review of Systems  Positive: Hip pain Negative: Fever, injury  Physical Exam  BP (!) 151/96 (BP Location: Left Arm)   Pulse 95   Temp 98.3 F (36.8 C) (Oral)   Resp 16   Ht 5\' 8"  (1.727 m)   Wt 70.3 kg   SpO2 97%   BMI 23.57 kg/m  Gen:   Awake, no distress   Resp:  Normal effort  MSK:   Moves extremities without difficulty  Other:    Medical Decision Making  Medically screening exam initiated at 1:56 PM.  Appropriate orders placed.  Philip Owen was informed that the remainder of the evaluation will be completed by another provider, this initial triage assessment does not replace that evaluation, and the importance of remaining in the ED until their evaluation is complete.   Nicole Kindred, FNP 02/19/21 1358    02/21/21, MD 02/19/21 1425

## 2021-02-23 ENCOUNTER — Emergency Department: Payer: Medicaid Other

## 2021-02-23 ENCOUNTER — Encounter: Payer: Self-pay | Admitting: Emergency Medicine

## 2021-02-23 ENCOUNTER — Other Ambulatory Visit: Payer: Self-pay

## 2021-02-23 DIAGNOSIS — F1910 Other psychoactive substance abuse, uncomplicated: Secondary | ICD-10-CM | POA: Insufficient documentation

## 2021-02-23 DIAGNOSIS — R002 Palpitations: Secondary | ICD-10-CM | POA: Insufficient documentation

## 2021-02-23 DIAGNOSIS — Y900 Blood alcohol level of less than 20 mg/100 ml: Secondary | ICD-10-CM | POA: Insufficient documentation

## 2021-02-23 DIAGNOSIS — K219 Gastro-esophageal reflux disease without esophagitis: Secondary | ICD-10-CM | POA: Insufficient documentation

## 2021-02-23 DIAGNOSIS — F17210 Nicotine dependence, cigarettes, uncomplicated: Secondary | ICD-10-CM | POA: Insufficient documentation

## 2021-02-23 DIAGNOSIS — Z20822 Contact with and (suspected) exposure to covid-19: Secondary | ICD-10-CM | POA: Insufficient documentation

## 2021-02-23 DIAGNOSIS — Z79899 Other long term (current) drug therapy: Secondary | ICD-10-CM | POA: Insufficient documentation

## 2021-02-23 LAB — CBC
HCT: 38.6 % — ABNORMAL LOW (ref 39.0–52.0)
Hemoglobin: 13.5 g/dL (ref 13.0–17.0)
MCH: 29.3 pg (ref 26.0–34.0)
MCHC: 35 g/dL (ref 30.0–36.0)
MCV: 83.7 fL (ref 80.0–100.0)
Platelets: 301 10*3/uL (ref 150–400)
RBC: 4.61 MIL/uL (ref 4.22–5.81)
RDW: 12.9 % (ref 11.5–15.5)
WBC: 7.1 10*3/uL (ref 4.0–10.5)
nRBC: 0 % (ref 0.0–0.2)

## 2021-02-23 LAB — BASIC METABOLIC PANEL
Anion gap: 4 — ABNORMAL LOW (ref 5–15)
BUN: 19 mg/dL (ref 6–20)
CO2: 28 mmol/L (ref 22–32)
Calcium: 9.2 mg/dL (ref 8.9–10.3)
Chloride: 107 mmol/L (ref 98–111)
Creatinine, Ser: 1.21 mg/dL (ref 0.61–1.24)
GFR, Estimated: >60 mL/min (ref >60)
Glucose, Bld: 109 mg/dL — ABNORMAL HIGH (ref 70–99)
Potassium: 3.7 mmol/L (ref 3.5–5.1)
Sodium: 139 mmol/L (ref 135–145)

## 2021-02-23 LAB — URINE DRUG SCREEN, QUALITATIVE (ARMC ONLY)
Amphetamines, Ur Screen: POSITIVE — AB
Barbiturates, Ur Screen: NOT DETECTED
Benzodiazepine, Ur Scrn: NOT DETECTED
Cannabinoid 50 Ng, Ur ~~LOC~~: POSITIVE — AB
Cocaine Metabolite,Ur ~~LOC~~: POSITIVE — AB
MDMA (Ecstasy)Ur Screen: NOT DETECTED
Methadone Scn, Ur: NOT DETECTED
Opiate, Ur Screen: NOT DETECTED
Phencyclidine (PCP) Ur S: NOT DETECTED
Tricyclic, Ur Screen: NOT DETECTED

## 2021-02-23 LAB — TROPONIN I (HIGH SENSITIVITY)
Troponin I (High Sensitivity): <2 ng/L (ref <18)
Troponin I (High Sensitivity): 3 ng/L (ref <18)

## 2021-02-23 LAB — ETHANOL: Alcohol, Ethyl (B): <10 mg/dL (ref <10)

## 2021-02-23 NOTE — ED Triage Notes (Signed)
Pt arrived via [place] police for medical clearance to jail due to arrival to jail and pt c/o chest pain with heart palpitations. Per pt only drug used today was mariajuana. Pt unable to keep still in triage but able to answer all questions and follow commands appropriately. Pt denies SOB.

## 2021-02-24 ENCOUNTER — Emergency Department
Admission: EM | Admit: 2021-02-24 | Discharge: 2021-02-24 | Disposition: A | Discharge status: Home / Self Care | Payer: Medicaid Other | Attending: Emergency Medicine | Admitting: Emergency Medicine

## 2021-02-24 DIAGNOSIS — F1910 Other psychoactive substance abuse, uncomplicated: Secondary | ICD-10-CM

## 2021-02-24 DIAGNOSIS — R002 Palpitations: Secondary | ICD-10-CM

## 2021-02-24 LAB — RESP PANEL BY RT-PCR (FLU A&B, COVID) ARPGX2
Influenza A by PCR: NEGATIVE
Influenza B by PCR: NEGATIVE
SARS Coronavirus 2 by RT PCR: NEGATIVE

## 2021-02-24 LAB — D-DIMER, QUANTITATIVE: D-Dimer, Quant: 0.27 ug/mL-FEU (ref 0.00–0.50)

## 2021-02-24 MED ORDER — ACETAMINOPHEN 500 MG PO TABS: 1000.0000 mg | ORAL_TABLET | Freq: Once | ORAL | Status: AC

## 2021-02-24 MED ORDER — ASPIRIN 81 MG PO CHEW
324.0000 mg | CHEWABLE_TABLET | Freq: Once | ORAL | Status: AC
  Administered 2021-02-24: 324 mg via ORAL
  Filled 2021-02-24: qty 4

## 2021-02-24 MED ORDER — ACETAMINOPHEN 500 MG PO TABS
ORAL_TABLET | ORAL | Status: AC
  Administered 2021-02-24: 1000 mg via ORAL
  Filled 2021-02-24: qty 2

## 2021-02-24 NOTE — ED Provider Notes (Signed)
[facility] Emergency Department Provider Note  ____________________________________________  Time seen: Approximately 4:50 AM  I have reviewed the triage vital signs and the nursing notes.   HISTORY  Chief Complaint Chest Pain   HPI Philip Owen is a 45 y.o. male with a history of GERD and anxiety who presents for evaluation of chest pain.  Patient is here under custody of police after being arrested for a outstanding warrant.  Patient reports that when he arrived in jail he started having palpitations.  He reports that he feels like his heart skipping beats.  That is associated with soreness in his chest and mild SOB.  Symptoms started this evening.  Patient denies any drug use today other than marijuana.  Denies any cardiac history, no personal family history.  DVT, no recent travel immobilization, no leg pain or swelling, no hemoptysis or exogenous hormones.  Has had a mild cough for the last few days.  No nausea or vomiting, no abdominal pain.   Past Medical History:  Diagnosis Date   Acid reflux    Anxiety    GERD (gastroesophageal reflux disease)     Patient Active Problem List   Diagnosis Date Noted   Sepsis (HCC) 09/13/2019   Facial cellulitis 09/13/2019   AKI (acute kidney injury) (HCC) 09/13/2019   Pneumonia 05/22/2017    Past Surgical History:  Procedure Laterality Date   none      Prior to Admission medications   Medication Sig Start Date End Date Taking? Authorizing Provider  esomeprazole (NEXIUM 24HR) 20 MG capsule Take 20 mg by mouth daily at 12 noon.    [provider]  meloxicam (MOBIC) 15 MG tablet Take 1 tablet (15 mg total) by mouth daily. 02/19/21   Cuthriell, Delorise Royals, PA-C  predniSONE (DELTASONE) 10 MG tablet Take 1 tablet (10 mg total) by mouth as directed. 02/19/21   Cuthriell, Delorise Royals, PA-C    Allergies Tramadol and Naproxen  Family History  Problem Relation Age of Onset   Brain cancer Father      Social History Social History   Tobacco Use   Smoking status: Every Day    Packs/day: 0.50    Years: 20.00    Pack years: 10.00    Types: Cigarettes   Smokeless tobacco: Never  Vaping Use   Vaping Use: Never used  Substance Use Topics   Alcohol use: Yes    Comment: occ   Drug use: Not Currently    Types: Cocaine    Review of Systems  Constitutional: Negative for fever. Eyes: Negative for visual changes. ENT: Negative for sore throat. Neck: No neck pain  Cardiovascular: + chest soreness and palpitations Respiratory: + shortness of breath, cough Gastrointestinal: Negative for abdominal pain, vomiting or diarrhea. Genitourinary: Negative for dysuria. Musculoskeletal: Negative for back pain. Skin: Negative for rash. Neurological: Negative for headaches, weakness or numbness. Psych: No SI or HI  ____________________________________________   PHYSICAL EXAM:  VITAL SIGNS: ED Triage Vitals [02/23/21 2021]  Enc Vitals Group     BP (!) 159/110     Pulse Rate 100     Resp 20     Temp 98.3 F (36.8 C)     Temp Source Oral     SpO2 95 %     Weight      Height      Head Circumference      Peak Flow      Pain Score      Pain  Loc      Pain Edu?      Excl. in GC?     Constitutional: Alert and oriented. Well appearing and in no apparent distress. HEENT:      Head: Normocephalic and atraumatic.         Eyes: Conjunctivae are normal. Sclera is non-icteric.       Mouth/Throat: Mucous membranes are moist.       Neck: Supple with no signs of meningismus. Cardiovascular: Regular rate and rhythm. No murmurs, gallops, or rubs. 2+ symmetrical distal pulses are present in all extremities. No JVD. Respiratory: Normal respiratory effort. Lungs are clear to auscultation bilaterally.  Gastrointestinal: Soft, non tender, and non distended with positive bowel sounds. No rebound or guarding. Genitourinary: No CVA tenderness. Musculoskeletal:  No edema, cyanosis, or erythema of  extremities. Neurologic: Normal speech and language. Face is symmetric. Moving all extremities. No gross focal neurologic deficits are appreciated. Skin: Skin is warm, dry and intact. No rash noted. Psychiatric: Mood and affect are normal. Speech and behavior are normal.  ____________________________________________   LABS (all labs ordered are listed, but only abnormal results are displayed)  Labs Reviewed  BASIC METABOLIC PANEL - Abnormal; Notable for the following components:      Result Value   Glucose, Bld 109 (*)    Anion gap 4 (*)    All other components within normal limits  CBC - Abnormal; Notable for the following components:   HCT 38.6 (*)    All other components within normal limits  URINE DRUG SCREEN, QUALITATIVE ([facility] ONLY) - Abnormal; Notable for the following components:   Amphetamines, Ur Screen POSITIVE (*)    Cocaine Metabolite,Ur [place] POSITIVE (*)    Cannabinoid 50 Ng, Ur [place] POSITIVE (*)    All other components within normal limits  RESP PANEL BY RT-PCR (FLU A&B, COVID) ARPGX2  ETHANOL  D-DIMER, QUANTITATIVE  TROPONIN I (HIGH SENSITIVITY)  TROPONIN I (HIGH SENSITIVITY)   ____________________________________________  EKG  ED ECG REPORT I, Nita Sickle, the attending physician, personally viewed and interpreted this ECG.  Sinus tachycardia at the rate of 108, normal intervals, no ST elevations or depressions ____________________________________________  RADIOLOGY  I have personally reviewed the images performed during this visit and I agree with the Radiologist's read.   Interpretation by Radiologist:  DG Chest 2 View  Result Date: 02/23/2021 CLINICAL DATA:  Chest pain. EXAM: CHEST - 2 VIEW COMPARISON:  April 12, 2018 FINDINGS: The lungs are hyperinflated. There is no evidence of acute infiltrate, pleural effusion or pneumothorax. The heart size and mediastinal contours are within normal limits. Chronic sixth and seventh left rib fractures are  seen. IMPRESSION: No active cardiopulmonary disease. Electronically Signed   By: Aram Candela M.D.   On: 02/23/2021 20:40     ____________________________________________   PROCEDURES  Procedure(s) performed:yes .1-3 Lead EKG Interpretation Performed by: Nita Sickle, MD Authorized by: Nita Sickle, MD     Interpretation: non-specific     ECG rate assessment: normal     Rhythm: sinus rhythm     Ectopy: none     Conduction: normal     Critical Care performed:  None ____________________________________________   INITIAL IMPRESSION / ASSESSMENT AND PLAN / ED COURSE  45 y.o. male with a history of GERD and anxiety who presents for evaluation of chest soreness, palpitations, shortness of breath that started this evening after patient was arrested.  Patient is well-appearing in no distress with normal vital signs, heart regular rate and rhythm  with no murmurs.  EKG shows sinus tachycardia with a rate of 108 with no signs of dysrhythmias.  Patient looks euvolemic, no asymmetric leg swelling  Ddx dysrhythmias versus PE versus GERD versus COVID versus flu versus pneumonia versus side effects from drugs.  Patient was placed on telemetry and close monitor by me.  His entire monitoring was reviewed by me with no signs of dysrhythmias tox screen is positive for cocaine, amphetamine and cannabinoids which is most likely the cause of patient's symptoms.  He has had some PVCs while here.  He has no electrolyte abnormalities.  2 high-sensitivity troponins were negative with no signs of demand ischemia.  COVID and flu negative.  D-dimer was done and that was negative as well.  Chest x-ray visualized by me with no infiltrates or edema, confirmed by radiology.  After prolonged monitoring and with normal work-up I believe patient is stable for discharge to custody of police officer.  At this time most likely his symptoms are caused by side effects of drug use.  Discussed close follow-up with  PCP and my standard return precautions      _____________________________________________ Please note:  Patient was evaluated in Emergency Department today for the symptoms described in the history of present illness. Patient was evaluated in the context of the global COVID-19 pandemic, which necessitated consideration that the patient might be at risk for infection with the SARS-CoV-2 virus that causes COVID-19. Institutional protocols and algorithms that pertain to the evaluation of patients at risk for COVID-19 are in a state of rapid change based on information released by regulatory bodies including the CDC and federal and state organizations. These policies and algorithms were followed during the patient's care in the ED.  Some ED evaluations and interventions may be delayed as a result of limited staffing during the pandemic.   [place] Controlled Substance Database was reviewed by me. ____________________________________________   FINAL CLINICAL IMPRESSION(S) / ED DIAGNOSES   Final diagnoses:  Palpitations  Polysubstance abuse (HCC)      NEW MEDICATIONS STARTED DURING THIS VISIT:  ED Discharge Orders     None        Note:  This document was prepared using Dragon voice recognition software and may include unintentional dictation errors.    Don Perking, [place], MD 02/24/21 0500

## 2021-04-03 ENCOUNTER — Other Ambulatory Visit: Payer: Self-pay

## 2021-04-03 ENCOUNTER — Emergency Department
Admission: EM | Admit: 2021-04-03 | Discharge: 2021-04-03 | Disposition: A | Discharge status: Home / Self Care | Payer: Self-pay | Attending: Emergency Medicine | Admitting: Emergency Medicine

## 2021-04-03 DIAGNOSIS — F17210 Nicotine dependence, cigarettes, uncomplicated: Secondary | ICD-10-CM | POA: Insufficient documentation

## 2021-04-03 DIAGNOSIS — Z20822 Contact with and (suspected) exposure to covid-19: Secondary | ICD-10-CM | POA: Insufficient documentation

## 2021-04-03 DIAGNOSIS — J029 Acute pharyngitis, unspecified: Secondary | ICD-10-CM | POA: Insufficient documentation

## 2021-04-03 LAB — GROUP A STREP BY PCR: Group A Strep by PCR: NOT DETECTED

## 2021-04-03 LAB — RESP PANEL BY RT-PCR (FLU A&B, COVID) ARPGX2
Influenza A by PCR: NEGATIVE
Influenza B by PCR: NEGATIVE
SARS Coronavirus 2 by RT PCR: NEGATIVE

## 2021-04-03 NOTE — ED Triage Notes (Signed)
Pt reports sore throat for 2 days, denies fevers or other sx's or exposures

## 2021-04-03 NOTE — ED Notes (Signed)
Pt declined DC vitals 

## 2021-04-03 NOTE — Discharge Instructions (Signed)
Your strep test is negative.  You may be experiencing some symptoms related to a viral pharyngitis or viral upper respiratory infection.  You may take over-the-counter Tylenol and Motrin as needed.  You may also take over-the-counter cough and cold symptoms if necessary.  Throat lozenges and Chloraseptic throat spray can help relieve sore throat pain relief.  You may follow your COVID/flu results which are still pending at this time, or call MyChart.

## 2021-04-03 NOTE — ED Provider Notes (Signed)
[facility] Emergency Department Provider Note ____________________________________________  Time seen: 23  I have reviewed the triage vital signs and the nursing notes.  HISTORY  Chief Complaint  Sore Throat  HPI Philip Owen is a 45 y.o. male presents to the ED 2-day complaint of intermittent sore throat.  Patient denies any other symptoms or fevers.   Past Medical History:  Diagnosis Date   Acid reflux    Anxiety    GERD (gastroesophageal reflux disease)     Patient Active Problem List   Diagnosis Date Noted   Sepsis (HCC) 09/13/2019   Facial cellulitis 09/13/2019   AKI (acute kidney injury) (HCC) 09/13/2019   Pneumonia 05/22/2017    Past Surgical History:  Procedure Laterality Date   none      Prior to Admission medications   Medication Sig Start Date End Date Taking? Authorizing Provider  esomeprazole (NEXIUM 24HR) 20 MG capsule Take 20 mg by mouth daily at 12 noon.    [provider]  meloxicam (MOBIC) 15 MG tablet Take 1 tablet (15 mg total) by mouth daily. 02/19/21   Cuthriell, Delorise Royals, PA-C  predniSONE (DELTASONE) 10 MG tablet Take 1 tablet (10 mg total) by mouth as directed. 02/19/21   Cuthriell, Delorise Royals, PA-C    Allergies Tramadol and Naproxen  Family History  Problem Relation Age of Onset   Brain cancer Father     Social History Social History   Tobacco Use   Smoking status: Every Day    Packs/day: 0.50    Years: 20.00    Pack years: 10.00    Types: Cigarettes   Smokeless tobacco: Never  Vaping Use   Vaping Use: Never used  Substance Use Topics   Alcohol use: Yes    Comment: occ   Drug use: Not Currently    Types: Cocaine    Review of Systems  Constitutional: Negative for fever. Eyes: Negative for visual changes. ENT: Positive for sore throat. Cardiovascular: Negative for chest pain. Respiratory: Negative for shortness of breath. Gastrointestinal: Negative for abdominal pain, vomiting  and diarrhea. Genitourinary: Negative for dysuria. Musculoskeletal: Negative for back pain. Skin: Negative for rash. Neurological: Negative for headaches, focal weakness or numbness. ____________________________________________  PHYSICAL EXAM:  VITAL SIGNS: ED Triage Vitals  Enc Vitals Group     BP 04/03/21 1757 (!) 141/98     Pulse Rate 04/03/21 1757 83     Resp 04/03/21 1757 20     Temp 04/03/21 1757 98 F (36.7 C)     Temp Source 04/03/21 1757 Oral     SpO2 04/03/21 1757 100 %     Weight 04/03/21 1756 165 lb (74.8 kg)     Height 04/03/21 1756 5\' 6"  (1.676 m)     Head Circumference --      Peak Flow --      Pain Score 04/03/21 1756 8     Pain Loc --      Pain Edu? --      Excl. in GC? --     Constitutional: Alert and oriented. Well appearing and in no distress. Head: Normocephalic and atraumatic. Eyes: Conjunctivae are normal. PERRL. Normal extraocular movements Ears: Canals clear. TMs intact bilaterally. Nose: No congestion/rhinorrhea/epistaxis. Mouth/Throat: Mucous membranes are moist.  Uvula is midline and tonsils are flat.  No oropharyngeal lesions are appreciated. Neck: Supple. No thyromegaly. Hematological/Lymphatic/Immunological: No cervical lymphadenopathy. Cardiovascular: Normal rate, regular rhythm. Normal distal pulses. Respiratory: Normal respiratory effort. No wheezes/rales/rhonchi. Musculoskeletal: Nontender with normal  range of motion in all extremities.  Neurologic:  Normal gait without ataxia. Normal speech and language. No gross focal neurologic deficits are appreciated. Skin:  Skin is warm, dry and intact. No rash noted. Psychiatric: Mood and affect are normal. Patient exhibits appropriate insight and judgment. ____________________________________________    {LABS (pertinent positives/negatives)  Labs Reviewed  GROUP A STREP BY PCR  RESP PANEL BY RT-PCR (FLU A&B, COVID) ARPGX2   ____________________________________________  {EKG  ____________________________________________   RADIOLOGY Official radiology report(s): No results found. ____________________________________________  PROCEDURES   Procedures ____________________________________________   INITIAL IMPRESSION / ASSESSMENT AND PLAN / ED COURSE  As part of my medical decision making, I reviewed the following data within the electronic MEDICAL RECORD NUMBER Labs reviewed as noted and Notes from prior ED visits    DDX: strep throat, covid, RSV, influenza, viral URI  Patient ED evaluation of 2 days of intermittent sore throat and fevers.  He is evaluated for his complaints in ED, found to be in no acute distress.  He presents afebrile without tachypnea or respiratory distress.  Patient was treated symptomatically with over-the-counter Tylenol, Motrin, and cough medicines as needed.  Strep test is negative, so no indication for any antibiotics at this time.  Patient will continue to monitor, MyChart for viral panel results.  He will treat symptoms accordingly.  [name] was evaluated in Emergency Department on 04/03/2021 for the symptoms described in the history of present illness. He was evaluated in the context of the global COVID-19 pandemic, which necessitated consideration that the patient might be at risk for infection with the SARS-CoV-2 virus that causes COVID-19. Institutional protocols and algorithms that pertain to the evaluation of patients at risk for COVID-19 are in a state of rapid change based on information released by regulatory bodies including the CDC and federal and state organizations. These policies and algorithms were followed during the patient's care in the ED. ____________________________________________  FINAL CLINICAL IMPRESSION(S) / ED DIAGNOSES  Final diagnoses:  Sore throat      Karmen Stabs, Charlesetta Ivory, PA-C 04/03/21 2001    Delton [place], MD 04/04/21 [phone]

## 2021-06-16 ENCOUNTER — Ambulatory Visit: Payer: Medicaid Other

## 2021-07-08 ENCOUNTER — Ambulatory Visit: Payer: Self-pay

## 2021-07-08 ENCOUNTER — Encounter: Payer: Self-pay | Admitting: Physician Assistant

## 2021-07-08 ENCOUNTER — Ambulatory Visit: Payer: Medicaid Other | Admitting: Physician Assistant

## 2021-07-08 ENCOUNTER — Other Ambulatory Visit: Payer: Self-pay

## 2021-07-08 DIAGNOSIS — Z113 Encounter for screening for infections with a predominantly sexual mode of transmission: Secondary | ICD-10-CM

## 2021-07-08 LAB — GRAM STAIN

## 2021-07-08 LAB — HM HIV SCREENING LAB: HM HIV Screening: NEGATIVE

## 2021-07-08 MED ORDER — CEFTRIAXONE SODIUM 500 MG IJ SOLR
500.0000 mg | Freq: Once | INTRAMUSCULAR | Status: AC
  Administered 2021-07-08: 17:00:00 500 mg via INTRAMUSCULAR

## 2021-07-08 MED ORDER — DOXYCYCLINE HYCLATE 100 MG PO TABS: 100.0000 mg | ORAL_TABLET | Freq: Two times a day (BID) | ORAL | 0 refills | Status: AC

## 2021-07-08 NOTE — Progress Notes (Signed)
Gram stain results reviewed by A. Streilein PA-C and client treated for gonorrhea and NGU per standing orders. Client tolerated injection with minimal complaints. Rich Number, RN ? ?

## 2021-07-08 NOTE — Progress Notes (Signed)
[facility] Department ?STI clinic/screening visit ? ?Subjective:  ?RAFFAEL FARRAGHER is a 46 y.o. male being seen today for an STI screening visit. The patient reports they do have symptoms.   ? ?Patient has the following medical conditions:   ?Patient Active Problem List  ? Diagnosis Date Noted  ? Sepsis ([place]) 09/13/2019  ? Facial cellulitis 09/13/2019  ? AKI (acute kidney injury) ([place]) 09/13/2019  ? Pneumonia 05/22/2017  ? ? ? ?Chief Complaint  ?Patient presents with  ? SEXUALLY TRANSMITTED DISEASE  ? ? ?Mr. Miramontes presents for eval of yellow urethral discharge and burning with urination for several days, has had only 1 male partner in last 2 mo. No condom use. No known STI exposure. No N/V/F/rash or other symptoms. Marijuana use in last several months.  ? ? ? ?Does the patient or their partner desires a pregnancy in the next year? No ? ?Screening for MPX risk: ?Does the patient have an unexplained rash? No ?Is the patient MSM? No ?Does the patient endorse multiple sex partners or anonymous sex partners? No ?Did the patient have close or sexual contact with a person diagnosed with MPX? No ?Has the patient traveled outside the [place] where MPX is endemic? No ?Is there a high clinical suspicion for MPX-- evidenced by one of the following No ? -Unlikely to be chickenpox ? -Lymphadenopathy ? -Rash that present in same phase of evolution on any given body part ? ? ?See flowsheet for further details and programmatic requirements.  ? ? ?The following portions of the patient's history were reviewed and updated as appropriate: allergies, current medications, past medical history, past social history, past surgical history and problem list. ? ?Objective:  ?There were no vitals filed for this visit. ? ?Physical Exam ?Constitutional:   ?   Appearance: Normal appearance.  ?HENT:  ?   Head: Normocephalic and atraumatic.  ?   Comments: No nits or hair loss ?   Mouth/Throat:  ?   Mouth: Mucous membranes are moist.  ?    Pharynx: Oropharynx is clear. No oropharyngeal exudate or posterior oropharyngeal erythema.  ?Pulmonary:  ?   Effort: Pulmonary effort is normal.  ?Abdominal:  ?   General: Abdomen is flat.  ?   Palpations: Abdomen is soft. There is no hepatomegaly or mass.  ?   Tenderness: There is no abdominal tenderness.  ?Genitourinary: ?   Pubic Area: No rash or pubic lice.   ?   Penis: Circumcised. Discharge present.   ?   Testes:     ?   Right: Mass present.  ?   Epididymis:  ?   Right: Normal.  ?   Left: Normal.  ?   Tanner stage (genital): 5.  ?   Rectum: Normal.  ?   Comments: Yellow purulent urethral discharge, copious. ?1cm mobile, nontender round smooth mass in scrotum above R testicle, pt states unchanged over many years. ?Lymphadenopathy:  ?   Head:  ?   Right side of head: No preauricular or posterior auricular adenopathy.  ?   Left side of head: No preauricular or posterior auricular adenopathy.  ?   Cervical: No cervical adenopathy.  ?   Upper Body:  ?   Right upper body: No supraclavicular or axillary adenopathy.  ?   Left upper body: No supraclavicular or axillary adenopathy.  ?   Lower Body: No right inguinal adenopathy. No left inguinal adenopathy.  ?Skin: ?   General: Skin is warm and dry.  ?  Findings: No rash.  ?Neurological:  ?   Mental Status: He is alert and oriented to person, place, and time.  ? ? ? ? ?Assessment and Plan:  ?CARLAS DOMOND is a 46 y.o. male presenting to the [facility] Department for STI screening ? ?1. Routine screening for STI (sexually transmitted infection) ?Treat Gram stain findings per S.O. Enc pt to have testing every 3-6 mo. Enc f/u with PCP for scrotal mass, as per prior instructions, though likely benign due to stable. ?- HIV  LAB ?- Syphilis Serology,  Lab ?- Gonococcus culture ?- Gram stain ?- cefTRIAXone (ROCEPHIN) injection 500 mg ?- doxycycline (VIBRA-TABS) 100 MG tablet; Take 1 tablet (100 mg total) by mouth 2 (two) times daily for 14  doses.  Dispense: 14 tablet; Refill: 0 ? ? ? ?Return in about 6 months (around 01/08/2022) for STI screening. ? ?No future appointments. ? ?Lora Havens, PA-C ?

## 2021-08-12 NOTE — Addendum Note (Signed)
Addended by: Veatrice Kells on: 08/12/2021 08:57 AM ? ? Modules accepted: Orders ? ?

## 2021-11-05 ENCOUNTER — Emergency Department
Admission: EM | Admit: 2021-11-05 | Discharge: 2021-11-05 | Discharge status: Against Medical Advice | Payer: Self-pay | Attending: Emergency Medicine | Admitting: Emergency Medicine

## 2021-11-05 ENCOUNTER — Other Ambulatory Visit: Payer: Self-pay

## 2021-11-05 DIAGNOSIS — Z5321 Procedure and treatment not carried out due to patient leaving prior to being seen by health care provider: Secondary | ICD-10-CM | POA: Insufficient documentation

## 2021-11-05 DIAGNOSIS — R0981 Nasal congestion: Secondary | ICD-10-CM | POA: Insufficient documentation

## 2021-11-05 DIAGNOSIS — R6884 Jaw pain: Secondary | ICD-10-CM | POA: Insufficient documentation

## 2021-11-05 DIAGNOSIS — H9201 Otalgia, right ear: Secondary | ICD-10-CM | POA: Insufficient documentation

## 2021-11-05 NOTE — ED Triage Notes (Signed)
Pt to ED via POV from home. Pt reports right ear pain and popping. Pt also states when he chews his right jaw pops. Pt denies hx TMJ. Pt also reports partner tested + for Hep C and wants to get tested as well.

## 2021-11-05 NOTE — ED Notes (Signed)
Pt seen by security walking with his girl friend to the bus stop.

## 2021-11-05 NOTE — ED Triage Notes (Signed)
Pt c/o right ear pain with sinus congestion for the past 2 days.

## 2021-11-05 NOTE — ED Notes (Signed)
Called pt several times no answer  

## 2021-11-05 NOTE — ED Provider Triage Note (Signed)
Emergency Medicine Provider Triage Evaluation Note  Philip Owen, a 46 y.o. male  was evaluated in triage.  Pt complains of right ear popping and pain.  Also endorses pain to his right jaw when he chews.  He denies any history of TMJ or any recent dental work.  An unrelated complaint, he reports that his partner tested positive for hepatitis C, is requesting testing at this time.  Review of Systems  Positive: Right otalgia and jaw pain Negative: Fever chills sweats  Physical Exam  BP (!) 151/102 (BP Location: Left Arm)   Pulse 96   Temp 98 F (36.7 C) (Oral)   Resp 16   Ht 5\' 8"  (1.727 m)   Wt 70.3 kg   SpO2 99%   BMI 23.57 kg/m  Gen:   Awake, no distress   Resp:  Normal effort  MSK:   Moves extremities without difficulty  Other:    Medical Decision Making  Medically screening exam initiated at 5:24 PM.  Appropriate orders placed.  BATES COLLINGTON was informed that the remainder of the evaluation will be completed by another provider, this initial triage assessment does not replace that evaluation, and the importance of remaining in the ED until their evaluation is complete.  Patient to the ED for evaluation of right otalgia and right TMJ discomfort.   Nicole Kindred, PA-C 11/05/21 1727

## 2021-11-05 NOTE — ED Notes (Signed)
Called x2

## 2021-11-08 ENCOUNTER — Telehealth: Payer: Self-pay

## 2021-11-08 NOTE — Telephone Encounter (Signed)
Pt had called inquiring on hep b testing.Stated his wife was positive for hep B and he needed to be tested. Attempted to contact number listed ,no answer/voicemail full unable to leave msg.Hiis mother /alternate contact is one who called back. Stated he did not having a working phone  and she would relay the message for him to call.

## 2021-12-06 ENCOUNTER — Other Ambulatory Visit: Payer: Self-pay

## 2021-12-06 ENCOUNTER — Emergency Department
Admission: EM | Admit: 2021-12-06 | Discharge: 2021-12-06 | Disposition: A | Discharge status: Home / Self Care | Payer: Self-pay | Attending: Emergency Medicine | Admitting: Emergency Medicine

## 2021-12-06 DIAGNOSIS — F1510 Other stimulant abuse, uncomplicated: Secondary | ICD-10-CM | POA: Insufficient documentation

## 2021-12-06 DIAGNOSIS — R7989 Other specified abnormal findings of blood chemistry: Secondary | ICD-10-CM | POA: Insufficient documentation

## 2021-12-06 DIAGNOSIS — M6282 Rhabdomyolysis: Secondary | ICD-10-CM | POA: Insufficient documentation

## 2021-12-06 LAB — BASIC METABOLIC PANEL
Anion gap: 5 (ref 5–15)
Anion gap: 5 (ref 5–15)
BUN: 39 mg/dL — ABNORMAL HIGH (ref 6–20)
BUN: 31 mg/dL — ABNORMAL HIGH (ref 6–20)
CO2: 26 mmol/L (ref 22–32)
CO2: 22 mmol/L (ref 22–32)
Calcium: 8.6 mg/dL — ABNORMAL LOW (ref 8.9–10.3)
Calcium: 8.2 mg/dL — ABNORMAL LOW (ref 8.9–10.3)
Chloride: 105 mmol/L (ref 98–111)
Chloride: 109 mmol/L (ref 98–111)
Creatinine, Ser: 1.24 mg/dL (ref 0.61–1.24)
Creatinine, Ser: 1.03 mg/dL (ref 0.61–1.24)
GFR, Estimated: >60 mL/min (ref >60)
GFR, Estimated: >60 mL/min (ref >60)
Glucose, Bld: 90 mg/dL (ref 70–99)
Glucose, Bld: 94 mg/dL (ref 70–99)
Potassium: 3.9 mmol/L (ref 3.5–5.1)
Potassium: 3.8 mmol/L (ref 3.5–5.1)
Sodium: 136 mmol/L (ref 135–145)
Sodium: 136 mmol/L (ref 135–145)

## 2021-12-06 LAB — CK
Total CK: 1846 U/L — ABNORMAL HIGH (ref 49–397)
Total CK: 1532 U/L — ABNORMAL HIGH (ref 49–397)

## 2021-12-06 MED ORDER — SODIUM CHLORIDE 0.9 % IV BOLUS
2000.0000 mL | Freq: Once | INTRAVENOUS | Status: AC
  Administered 2021-12-06: 2000 mL via INTRAVENOUS

## 2021-12-06 NOTE — ED Provider Notes (Signed)
[facility] Provider Note    Event Date/Time   First MD Initiated Contact with Patient 12/06/21 1637     (approximate)   History   Claudication   HPI  Philip Owen is a 46 y.o. male reports no major past medical history but does report a history of artery drug abuse about 2 years ago  For the last 2 days been having cramping in his calves when he walks.  He reports it started after being out in the heat, has been trying to hydrate but feels like he is getting a lot of cramps in the back of his legs when he walks.  No fevers or chills.  No nausea or vomiting.  No rash.  No weakness   Patient reports he has shared needles with someone that is known to have hepatitis about 2 years ago, he gets HIV testing through the county, but like to know if he could be tested for hepatitis as well today  No nausea or vomiting no yellowing of the skin.  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  Physical Exam   Triage Vital Signs: ED Triage Vitals  Enc Vitals Group     BP 12/06/21 1601 103/65     Pulse Rate 12/06/21 1601 68     Resp 12/06/21 1601 16     Temp 12/06/21 1601 99 F (37.2 C)     Temp Source 12/06/21 1601 Oral     SpO2 12/06/21 1601 100 %     Weight 12/06/21 1600 155 lb (70.3 kg)     Height 12/06/21 1600 5\' 8"  (1.727 m)     Head Circumference --      Peak Flow --      Pain Score 12/06/21 1600 6     Pain Loc --      Pain Edu? --      Excl. in GC? --     Most recent vital signs: Vitals:   12/06/21 1601  BP: 103/65  Pulse: 68  Resp: 16  Temp: 99 F (37.2 C)  SpO2: 100%     General: Awake, no distress.  CV:  Good peripheral perfusion.  Strong palpable dorsalis pedis and posterior tibial pulses with well-perfused feet bilateral Resp:  Normal effort.  Abd:  No distention.  Other:  Stands ambulates without difficulty.  No noted acute muscle cramps at this time.  Reports slight soreness to palpation of the  gastrocnemius bilaterally.  No tenderness over the Achilles tendon.  He walks without difficulty.  No pulse deficits in the lower extremities.  Though the calf and gastrocnemius regions are sore, his compartments are soft throughout.  No tenderness to palpation of the quads or hamstrings/upper legs or upper extremities bilateral  5 out of 5 strength with normal sensation lower extremities bilaterally.  ED Results / Procedures / Treatments   Labs (all labs ordered are listed, but only abnormal results are displayed) Labs Reviewed  CK - Abnormal; Notable for the following components:      Result Value   Total CK 1,846 (*)    All other components within normal limits  BASIC METABOLIC PANEL - Abnormal; Notable for the following components:   BUN 39 (*)    Calcium 8.6 (*)    All other components within normal limits  HEPATITIS PANEL, ACUTE  CK  BASIC METABOLIC PANEL     EKG     RADIOLOGY  PROCEDURES:  Critical Care performed: No  Procedures   MEDICATIONS ORDERED IN ED: Medications  sodium chloride 0.9 % bolus 2,000 mL (has no administration in time range)     IMPRESSION / MDM / ASSESSMENT AND PLAN / ED COURSE  I reviewed the triage vital signs and the nursing notes.                              Differential diagnosis includes, but is not limited to, possible heat cramps, possible rhabdomyolysis though he only takes a PPI for medication which makes this unlikely and denies any workout regimen, electrolyte abnormality, muscular deconditioning, etc.  Strong palpable pulses and well-perfused lower extremity making PID or acute claudication unlikely.  We will send hepatitis panel, patient understands results will not return today and he would receive a call if positive.  Patient's presentation is most consistent with acute complicated illness / injury requiring diagnostic workup.   Clinical Course as of 12/06/21 1938  Mon Dec 06, 2021  1517 Discussed with patient  his labs concerning for muscle breakdown, rhabdomyolysis.  Renal function preserved at this time BUN elevated.  On further discussion, patient does report that he did use methamphetamine this week and I suspect that is likely the driving factor here.  We will plan to hydrate him generously with 2 L of fluid, recheck CK and metabolic panel thereafter.  If improvement patient likely be able to be discharged with strict instructions which I advised him to avoid drug use including methamphetamine cocaine etc. and to hydrate well. [MQ]    Clinical Course User Index [MQ] Sharyn Creamer, MD   ----------------------------------------- 11:25 PM on 12/06/2021 ----------------------------------------- CK downtrending, renal function stable to slightly improving.  Patient resting comfortably without distress at this time.  Reexamination his calf muscles remain slightly sore, but no firmness or evidence of suggest a compartment syndrome.  His CK is relatively low without evidence of severe rhabdomyolysis at this time.  Patient agreeable to careful return precautions, staying hydrated, avoidance of illicit drugs such as methamphetamine that may have exacerbated his illness today.  Return precautions and treatment recommendations and follow-up discussed with the patient who is agreeable with the plan.  Patient will return to the ER if any new or worsening concerns    FINAL CLINICAL IMPRESSION(S) / ED DIAGNOSES   Final diagnoses:  Non-traumatic rhabdomyolysis  Methamphetamine abuse (HCC)     Rx / DC Orders   ED Discharge Orders     None        Note:  This document was prepared using Dragon voice recognition software and may include unintentional dictation errors.   Sharyn Creamer, MD 12/06/21 2326

## 2021-12-06 NOTE — ED Triage Notes (Signed)
Pt states he has bilateral calf pain when he walks for about 3 days- pt denies any recent injury

## 2021-12-06 NOTE — ED Notes (Signed)
Pt Dc to home. Dc instructions reviewed with a ll questions answered. Pt verbalizes understanding. Iv removed cath intact, pressure dressing applied. No bleeding noted at site.Pt ambulatory out of dept with steady gait with family member. Unable to obtain DC vitals

## 2021-12-06 NOTE — Discharge Instructions (Addendum)
Please avoid use of illicit substances including amphetamines.  Suspect the use of methamphetamine may have resulted in your muscle pain and cramps today  Return to the ER right away if you notice issues such as you not able to urinate, you are not producing urine regularly, you begin having worsening muscle aches, severe calf or muscle pain, fevers vomiting weakness or other concerns arise.

## 2021-12-06 NOTE — ED Notes (Signed)
Assumed care pt reports intermittent ble cramping over the last few days. States has been working outside a lot.

## 2021-12-07 LAB — HEPATITIS PANEL, ACUTE
HCV Ab: REACTIVE — AB
Hep A IgM: NONREACTIVE
Hep B C IgM: NONREACTIVE
Hepatitis B Surface Ag: NONREACTIVE

## 2022-03-17 ENCOUNTER — Other Ambulatory Visit: Payer: Self-pay

## 2022-03-17 ENCOUNTER — Encounter: Payer: Self-pay | Admitting: Emergency Medicine

## 2022-03-17 ENCOUNTER — Emergency Department: Payer: Self-pay

## 2022-03-17 ENCOUNTER — Emergency Department
Admission: EM | Admit: 2022-03-17 | Discharge: 2022-03-17 | Disposition: A | Discharge status: Home / Self Care | Payer: Self-pay | Attending: Emergency Medicine | Admitting: Emergency Medicine

## 2022-03-17 DIAGNOSIS — Z20822 Contact with and (suspected) exposure to covid-19: Secondary | ICD-10-CM | POA: Insufficient documentation

## 2022-03-17 DIAGNOSIS — B349 Viral infection, unspecified: Secondary | ICD-10-CM | POA: Insufficient documentation

## 2022-03-17 LAB — RESP PANEL BY RT-PCR (FLU A&B, COVID) ARPGX2
Influenza A by PCR: NEGATIVE
Influenza B by PCR: NEGATIVE
SARS Coronavirus 2 by RT PCR: NEGATIVE

## 2022-03-17 MED ORDER — BENZONATATE 100 MG PO CAPS: 100.0000 mg | ORAL_CAPSULE | Freq: Three times a day (TID) | ORAL | 0 refills | Status: DC | PRN

## 2022-03-17 MED ORDER — PSEUDOEPH-BROMPHEN-DM 30-2-10 MG/5ML PO SYRP: 10.0000 mL | ORAL_SOLUTION | Freq: Four times a day (QID) | ORAL | 0 refills | Status: DC | PRN

## 2022-03-17 NOTE — ED Triage Notes (Signed)
Pt here with a fever. Pt states he was exposed to someone who tested postive for covid and wants to be tested.

## 2022-03-17 NOTE — ED Provider Notes (Signed)
[facility] Provider Note  Patient Contact: 3:33 PM (approximate)   History   Fever   HPI  Philip Owen is a 46 y.o. male who presents to the emergency department complaining of fever, chills, cough.  Patient was exposed to COVID and would like a COVID test.  He is also concerned he may have pneumonia.  No significant nasal congestion or sore throat.  No other complaints at this time.     Physical Exam   Triage Vital Signs: ED Triage Vitals  Enc Vitals Group     BP 03/17/22 1506 (!) 140/96     Pulse Rate 03/17/22 1506 96     Resp 03/17/22 1506 18     Temp 03/17/22 1506 98.6 F (37 C)     Temp Source 03/17/22 1506 Oral     SpO2 03/17/22 1508 97 %     Weight 03/17/22 1505 154 lb 15.7 oz (70.3 kg)     Height 03/17/22 1505 5\' 8"  (1.727 m)     Head Circumference --      Peak Flow --      Pain Score 03/17/22 1505 0     Pain Loc --      Pain Edu? --      Excl. in GC? --     Most recent vital signs: Vitals:   03/17/22 1506 03/17/22 1508  BP: (!) 140/96   Pulse: 96   Resp: 18   Temp: 98.6 F (37 C)   SpO2:  97%     General: Alert and in no acute distress. ENT:      Ears:       Nose: No congestion/rhinnorhea.      Mouth/Throat: Mucous membranes are moist. Neck: No stridor.  Hematological/Lymphatic/Immunilogical: No cervical lymphadenopathy. Cardiovascular:  Good peripheral perfusion Respiratory: Normal respiratory effort without tachypnea or retractions. Lungs CTAB. Good air entry to the bases with no decreased or absent breath sounds Musculoskeletal: Full range of motion to all extremities.  Neurologic:  No gross focal neurologic deficits are appreciated.  Skin:   No rash noted Other:   ED Results / Procedures / Treatments   Labs (all labs ordered are listed, but only abnormal results are displayed) Labs Reviewed  RESP PANEL BY RT-PCR (FLU A&B, COVID) [id number]     EKG     RADIOLOGY  I personally viewed, evaluated,  and interpreted these images as part of my medical decision making, as well as reviewing the written report by the radiologist.  ED Provider Interpretation: No acute findings on chest x-ray.  Specifically no consolidation concerning for pneumonia.  DG Chest 2 View  Result Date: 03/17/2022 CLINICAL DATA:  Cough.  Fever.  Chills. EXAM: CHEST - 2 VIEW COMPARISON:  02/23/21 CXR FINDINGS: No pleural effusion. No pneumothorax. No focal airspace opacity. Normal cardiac and mediastinal contours. No displaced rib fractures. Visualized upper abdomen is unremarkable. Vertebral body heights are maintained. IMPRESSION: No focal airspace opacity. Electronically Signed   By: Lorenza Cambridge M.D.   On: 03/17/2022 16:02    PROCEDURES:  Critical Care performed: No  Procedures   MEDICATIONS ORDERED IN ED: Medications - No data to display   IMPRESSION / MDM / ASSESSMENT AND PLAN / ED COURSE  I reviewed the triage vital signs and the nursing notes.                              Differential diagnosis  includes, but is not limited to, covid, flu, pneumonia  Patient's presentation is most consistent with acute presentation with potential threat to life or bodily function.   Patient's diagnosis is consistent with viral illness.  Patient presents emergency department with cough, fevers and chills.  Patient states that he has been exposed to somebody with COVID.  COVID and flu test were negative.  Chest x-ray reveals no consolidation concerning for pneumonia.  Overall physical exam was reassuring.  Suspect viral illness.  Symptom control medications will be prescribed for the patient.  Tylenol, Motrin, fluids at home.  Return precautions discussed with patient.  Follow-up with primary care as needed.. Patient is given ED precautions to return to the ED for any worsening or new symptoms.        FINAL CLINICAL IMPRESSION(S) / ED DIAGNOSES   Final diagnoses:  Viral illness     Rx / DC Orders   ED  Discharge Orders     None        Note:  This document was prepared using Dragon voice recognition software and may include unintentional dictation errors.   Racheal Patches, PA-C 03/17/22 1707    Corena Herter, MD 03/17/22 1720

## 2022-03-18 ENCOUNTER — Emergency Department
Admission: EM | Admit: 2022-03-18 | Discharge: 2022-03-18 | Disposition: A | Discharge status: Home / Self Care | Payer: Medicaid Other | Attending: Emergency Medicine | Admitting: Emergency Medicine

## 2022-03-18 ENCOUNTER — Other Ambulatory Visit: Payer: Self-pay

## 2022-03-18 DIAGNOSIS — W228XXA Striking against or struck by other objects, initial encounter: Secondary | ICD-10-CM | POA: Insufficient documentation

## 2022-03-18 DIAGNOSIS — S81011A Laceration without foreign body, right knee, initial encounter: Secondary | ICD-10-CM | POA: Insufficient documentation

## 2022-03-18 MED ORDER — HYDROCODONE-ACETAMINOPHEN 5-325 MG PO TABS
1.0000 | ORAL_TABLET | Freq: Once | ORAL | Status: AC
  Administered 2022-03-18: 1 via ORAL
  Filled 2022-03-18: qty 1

## 2022-03-18 MED ORDER — LIDOCAINE HCL (PF) 1 % IJ SOLN
5.0000 mL | Freq: Once | INTRAMUSCULAR | Status: AC
  Administered 2022-03-18: 5 mL via INTRADERMAL
  Filled 2022-03-18: qty 5

## 2022-03-18 NOTE — ED Triage Notes (Signed)
Pt states he fell this am at 0530 resulting in laceration to right knee. Pt has pants over laceration, but reports linear lac with controlled bleeding noted. Ambulatory without difficulty.

## 2022-03-18 NOTE — ED Provider Notes (Signed)
[facility] Provider Note    Event Date/Time   First MD Initiated Contact with Patient 03/18/22 2055     (approximate)   History   Laceration   HPI  Philip Owen is a 46 y.o. male with no significant past medical history presents to the emergency department for treatment and evaluation of laceration to the right knee that occurred this morning.  He states that he hit his knee on a brick step.  Tetanus is up-to-date.  Bleeding well controlled.    Physical Exam   Triage Vital Signs: ED Triage Vitals [03/18/22 1904]  Enc Vitals Group     BP (!) 159/103     Pulse Rate 100     Resp 16     Temp 98.8 F (37.1 C)     Temp Source Oral     SpO2 98 %     Weight 160 lb (72.6 kg)     Height 5\' 8"  (1.727 m)     Head Circumference      Peak Flow      Pain Score 6     Pain Loc      Pain Edu?      Excl. in GC?     Most recent vital signs: Vitals:   03/18/22 1904  BP: (!) 159/103  Pulse: 100  Resp: 16  Temp: 98.8 F (37.1 C)  SpO2: 98%     General: Awake, no distress.  CV:  Good peripheral perfusion.  Resp:  Normal effort.  Abd:  No distention.  Other:  2 cm laceration to the prepatellar surface of the right knee   ED Results / Procedures / Treatments   Labs (all labs ordered are listed, but only abnormal results are displayed) Labs Reviewed - No data to display   EKG     RADIOLOGY  Not indicated   PROCEDURES:  Critical Care performed: No  ..Laceration Repair  Date/Time: 03/18/2022 9:31 PM  Performed by: 03/20/2022, FNP Authorized by: Chinita Pester, FNP   Consent:    Consent obtained:  Verbal   Consent given by:  Patient   Risks discussed:  Pain and infection Anesthesia:    Anesthesia method:  Local infiltration   Local anesthetic:  Lidocaine 1% w/o epi Laceration details:    Location:  Leg   Leg location:  R knee   Length (cm):  2 Pre-procedure details:    Preparation:  Patient was prepped and  draped in usual sterile fashion Exploration:    Wound exploration: wound explored through full range of motion and entire depth of wound visualized   Treatment:    Area cleansed with:  Povidone-iodine and saline   Amount of cleaning:  Standard   Irrigation method:  Syringe   Debridement:  None Skin repair:    Repair method:  Sutures   Suture size:  4-0   Suture material:  Nylon   Suture technique:  Simple interrupted   Number of sutures:  3 Approximation:    Approximation:  Close Repair type:    Repair type:  Simple Post-procedure details:    Dressing:  Sterile dressing   Procedure completion:  Tolerated well, no immediate complications    MEDICATIONS ORDERED IN ED: Medications  lidocaine (PF) (XYLOCAINE) 1 % injection 5 mL (has no administration in time range)  HYDROcodone-acetaminophen (NORCO/VICODIN) 5-325 MG per tablet 1 tablet (has no administration in time range)     IMPRESSION / MDM / ASSESSMENT AND  PLAN / ED COURSE  I reviewed the triage vital signs and the nursing notes.                              Differential diagnosis includes, but is not limited to, skin laceration, vascular injury  Patient's presentation is most consistent with acute, uncomplicated illness.  46 year old male presenting to the emergency department for treatment and evaluation after sustaining a laceration to the right knee earlier today.  See HPI for further details.  Wound was cleaned and repaired as described above.  Patient tolerated procedure well.  Wound care instructions were discussed.  He will have the sutures removed in 10 days at urgent care or return here as he does not currently have a primary care provider.  He was encouraged to return sooner for any sign or concern of infection.  Tdap is within 5 years and was not updated today.     FINAL CLINICAL IMPRESSION(S) / ED DIAGNOSES   Final diagnoses:  Knee laceration, right, initial encounter     Rx / DC Orders   ED  Discharge Orders     None        Note:  This document was prepared using Dragon voice recognition software and may include unintentional dictation errors.   Chinita Pester, FNP 03/18/22 2132    Phineas Semen, MD 03/18/22 2322

## 2022-03-18 NOTE — Discharge Instructions (Signed)
Do not get the sutured area wet for 24 hours. After 24 hours, shower/bathe as usual and pat the area dry. °Change the bandage 2 times per day and apply antibiotic ointment. °Leave open to air when at no risk of getting the area dirty, but cover at night before bed. °See your PCP or go to Urgent Care in 10 days for suture removal or sooner for signs or concern of infection. ° °

## 2022-03-28 ENCOUNTER — Encounter: Payer: Self-pay | Admitting: Emergency Medicine

## 2022-03-28 ENCOUNTER — Emergency Department
Admission: EM | Admit: 2022-03-28 | Discharge: 2022-03-28 | Disposition: A | Discharge status: Home / Self Care | Payer: Self-pay | Attending: Emergency Medicine | Admitting: Emergency Medicine

## 2022-03-28 ENCOUNTER — Other Ambulatory Visit: Payer: Self-pay

## 2022-03-28 DIAGNOSIS — Z4802 Encounter for removal of sutures: Secondary | ICD-10-CM | POA: Insufficient documentation

## 2022-03-28 NOTE — ED Triage Notes (Signed)
Patient to ED for suture removal. Been approx 10 days on right knee.

## 2022-03-28 NOTE — ED Provider Notes (Signed)
[facility] Provider Note  Patient Contact: 4:38 PM (approximate)   History   Suture / Staple Removal   HPI  Philip Owen is a 46 y.o. male emergency department to have sutures removed from the right knee.  Sutures were placed 10 days ago.  Patient denies any complications with the laceration.  2 sutures still in place.     Physical Exam   Triage Vital Signs: ED Triage Vitals  Enc Vitals Group     BP 03/28/22 1517 119/84     Pulse Rate 03/28/22 1517 (!) 109     Resp 03/28/22 1517 18     Temp 03/28/22 1517 98 F (36.7 C)     Temp Source 03/28/22 1517 Oral     SpO2 03/28/22 1517 97 %     Weight --      Height --      Head Circumference --      Peak Flow --      Pain Score 03/28/22 1515 0     Pain Loc --      Pain Edu? --      Excl. in [place]? --     Most recent vital signs: Vitals:   03/28/22 1517  BP: 119/84  Pulse: (!) 109  Resp: 18  Temp: 98 F (36.7 C)  SpO2: 97%     General: Alert and in no acute distress. Cardiovascular:  Good peripheral perfusion Respiratory: Normal respiratory effort without tachypnea or retractions. Lungs CTAB.  Musculoskeletal: Full range of motion to all extremities.  Visualization of the right hand reveals laceration with 2 monthly sutures.  No signs of infection or dehiscence Neurologic:  No gross focal neurologic deficits are appreciated.  Skin:   No rash noted Other:   ED Results / Procedures / Treatments   Labs (all labs ordered are listed, but only abnormal results are displayed) Labs Reviewed - No data to display   EKG     RADIOLOGY    No results found.  PROCEDURES:  Critical Care performed: No  .Suture Removal  Date/Time: 03/28/2022 4:42 PM  Performed by: Darletta Moll, PA-C Authorized by: Darletta Moll, PA-C   Consent:    Consent obtained:  Verbal   Consent given by:  Patient   Risks discussed:  Pain, wound separation and bleeding Universal protocol:     Procedure explained and questions answered to patient or proxy's satisfaction: yes     Immediately prior to procedure, a time out was called: yes     Patient identity confirmed:  Verbally with patient Location:    Location:  Lower extremity   Lower extremity location:  Knee   Knee location:  R knee Procedure details:    Wound appearance:  No signs of infection and good wound healing   Number of sutures removed:  2 Post-procedure details:    Post-removal:  No dressing applied   Procedure completion:  Tolerated well, no immediate complications    MEDICATIONS ORDERED IN ED: Medications - No data to display   IMPRESSION / MDM / [place] / ED COURSE  I reviewed the triage vital signs and the nursing notes.                              Differential diagnosis includes, but is not limited to, suture removal, wound complication, infection  Patient's presentation is most consistent with acute presentation with potential threat  to life or bodily function.   Patient's diagnosis is consistent with suture removal.  Patient presents to the emergency department for removal of 2 sutures.  These were removed without complication.  Patient is discharged at this time.  Follow-up primary care as needed..  Patient is given ED precautions to return to the ED for any worsening or new symptoms.        FINAL CLINICAL IMPRESSION(S) / ED DIAGNOSES   Final diagnoses:  Visit for suture removal     Rx / DC Orders   ED Discharge Orders     None        Note:  This document was prepared using Dragon voice recognition software and may include unintentional dictation errors.   Racheal Patches, PA-C 03/28/22 1647    Sharman Cheek, MD 03/30/22 [phone]

## 2022-05-03 ENCOUNTER — Emergency Department
Admission: EM | Admit: 2022-05-03 | Discharge: 2022-05-04 | Disposition: A | Discharge status: Home / Self Care | Payer: Medicaid Other | Attending: Emergency Medicine | Admitting: Emergency Medicine

## 2022-05-03 DIAGNOSIS — J020 Streptococcal pharyngitis: Secondary | ICD-10-CM

## 2022-05-03 DIAGNOSIS — H9203 Otalgia, bilateral: Secondary | ICD-10-CM | POA: Insufficient documentation

## 2022-05-03 DIAGNOSIS — J029 Acute pharyngitis, unspecified: Secondary | ICD-10-CM | POA: Diagnosis present

## 2022-05-03 DIAGNOSIS — Z1152 Encounter for screening for COVID-19: Secondary | ICD-10-CM | POA: Diagnosis not present

## 2022-05-03 LAB — GROUP A STREP BY PCR: Group A Strep by PCR: DETECTED — AB

## 2022-05-03 MED ORDER — DEXTROSE 5 % IV SOLN: 1.2000 10*6.[IU] | Freq: Once | INTRAVENOUS | Status: DC

## 2022-05-03 MED ORDER — LIDOCAINE HCL (PF) 1 % IJ SOLN
5.0000 mL | Freq: Once | INTRAMUSCULAR | Status: DC
  Filled 2022-05-03: qty 5

## 2022-05-03 MED ORDER — PENICILLIN G BENZATHINE 1200000 UNIT/2ML IM SUSY
1.2000 10*6.[IU] | PREFILLED_SYRINGE | Freq: Once | INTRAMUSCULAR | Status: AC
Start: 2022-05-03 — End: 2022-05-04
  Administered 2022-05-04: 1.2 10*6.[IU] via INTRAMUSCULAR
  Filled 2022-05-03: qty 2

## 2022-05-03 NOTE — ED Provider Notes (Signed)
[facility] Emergency Department Provider Note     Event Date/Time   First MD Initiated Contact with Patient 05/03/22 2316     (approximate)   History   Sore Throat   HPI  Philip Owen is a 47 y.o. male presents to the ED with complaints of right ear pain and sore throat for the last several days.  Denies any associated cough, fever, or nausea vomiting.     Physical Exam   Triage Vital Signs: ED Triage Vitals  Enc Vitals Group     BP 05/03/22 2154 (!) 147/82     Pulse Rate 05/03/22 2154 95     Resp 05/03/22 2154 18     Temp 05/03/22 2154 98.8 F (37.1 C)     Temp Source 05/03/22 2154 Oral     SpO2 05/03/22 2154 100 %     Weight 05/03/22 2155 160 lb (72.6 kg)     Height 05/03/22 2155 5\' 8"  (1.727 m)     Head Circumference --      Peak Flow --      Pain Score 05/03/22 2155 5     Pain Loc --      Pain Edu? --      Excl. in GC? --     Most recent vital signs: Vitals:   05/03/22 2154  BP: (!) 147/82  Pulse: 95  Resp: 18  Temp: 98.8 F (37.1 C)  SpO2: 100%    General Awake, no distress.  HEENT NCAT. PERRL. EOMI. No rhinorrhea. Mucous membranes are moist.  Uvula is midline and tonsils are flat without significant erythema, edema, or exudates. CV:  Good peripheral perfusion.  RESP:  Normal effort.  ABD:  No distention.    ED Results / Procedures / Treatments   Labs (all labs ordered are listed, but only abnormal results are displayed) Labs Reviewed  GROUP A STREP BY PCR - Abnormal; Notable for the following components:      Result Value   Group A Strep by PCR DETECTED (*)    All other components within normal limits  RESP PANEL BY RT-PCR (RSV, FLU A&B, COVID)  RVPGX2    EKG   RADIOLOGY   PROCEDURES:  Critical Care performed: No  Procedures   MEDICATIONS ORDERED IN ED: Medications  penicillin g benzathine (BICILLIN LA) 1200000 UNIT/2ML injection 1.2 Million Units (1.2 Million Units Intramuscular Given  05/04/22 0000)     IMPRESSION / MDM / ASSESSMENT AND PLAN / ED COURSE  I reviewed the triage vital signs and the nursing notes.                              Differential diagnosis includes, but is not limited to, COVID, flu, RSV, AOM, strep pharyngitis, viral URI  Patient's presentation is most consistent with acute complicated illness / injury requiring diagnostic workup.  Patient to the ED for evaluation of weeks worth of complaint of bilateral ear pain as well as some intermittent sore throat pain.  He is evaluated for his complaints in ED, plan of reassuring exam and workup overall.  He does have a positive strep POC.  Patient's diagnosis is consistent with pharyngitis. Patient will be discharged home after agreeing to single dose of pen G in the ED. Patient is to follow up with primary provider or local urgent care as needed or otherwise directed. Patient is given ED precautions to return to the  ED for any worsening or new symptoms.  FINAL CLINICAL IMPRESSION(S) / ED DIAGNOSES   Final diagnoses:  Strep throat     Rx / DC Orders   ED Discharge Orders     None        Note:  This document was prepared using Dragon voice recognition software and may include unintentional dictation errors.    Lissa Hoard, PA-C 05/04/22 2349    Minna Antis, MD 05/05/22 2255

## 2022-05-03 NOTE — Discharge Instructions (Signed)
You have been treated for strep pharyngitis with a single dose of penicillin given as an injection in the ED.  Continue with Tylenol Motrin for any fevers.

## 2022-05-03 NOTE — ED Triage Notes (Signed)
Ambulatory to triage with steady with c/o right ear pain and sore throat for several days. Denies cough. Fever, or other sx.

## 2022-05-04 LAB — RESP PANEL BY RT-PCR (RSV, FLU A&B, COVID)  RVPGX2
Influenza A by PCR: NEGATIVE
Influenza B by PCR: NEGATIVE
Resp Syncytial Virus by PCR: NEGATIVE
SARS Coronavirus 2 by RT PCR: NEGATIVE

## 2022-05-10 ENCOUNTER — Encounter: Payer: Self-pay | Admitting: Emergency Medicine

## 2022-05-10 DIAGNOSIS — Z1152 Encounter for screening for COVID-19: Secondary | ICD-10-CM | POA: Insufficient documentation

## 2022-05-10 DIAGNOSIS — B349 Viral infection, unspecified: Secondary | ICD-10-CM | POA: Insufficient documentation

## 2022-05-10 DIAGNOSIS — M791 Myalgia, unspecified site: Secondary | ICD-10-CM | POA: Diagnosis present

## 2022-05-10 LAB — RESP PANEL BY RT-PCR (RSV, FLU A&B, COVID)  RVPGX2
Influenza A by PCR: NEGATIVE
Influenza B by PCR: NEGATIVE
Resp Syncytial Virus by PCR: NEGATIVE
SARS Coronavirus 2 by RT PCR: NEGATIVE

## 2022-05-10 NOTE — ED Triage Notes (Signed)
Pt presents via POV with complaints of generalized body aches that started ~1-2 weeks ago with associated cough. Pt was recently treated for strep ~7 days ago. Denies CP or SOB.

## 2022-05-10 NOTE — ED Provider Triage Note (Signed)
Emergency Medicine Provider Triage Evaluation Note  DIAGO HAIK , a 47 y.o. male  was evaluated in triage.  Pt complains of cough and generalized body aches. Was positive for strep last week and treated with IM antibiotics. No fever, nausea, vomiting, diarrhea.  Physical Exam  BP (!) 146/97 (BP Location: Right Arm)   Pulse (!) 112   Temp 98.8 F (37.1 C) (Oral)   Resp 17   Ht 5\' 8"  (1.727 m)   Wt 73 kg   SpO2 98%   BMI 24.47 kg/m  Gen:   Awake, no distress   Resp:  Normal effort  MSK:   Moves extremities without difficulty  Other:    Medical Decision Making  Medically screening exam initiated at 10:55 PM.  Appropriate orders placed.  BARUCH LEWERS was informed that the remainder of the evaluation will be completed by another provider, this initial triage assessment does not replace that evaluation, and the importance of remaining in the ED until their evaluation is complete.    Victorino Dike, FNP 05/10/22 2256

## 2022-05-11 ENCOUNTER — Emergency Department: Payer: Medicaid Other

## 2022-05-11 ENCOUNTER — Emergency Department
Admission: EM | Admit: 2022-05-11 | Discharge: 2022-05-11 | Disposition: A | Discharge status: Home / Self Care | Payer: Medicaid Other | Attending: Emergency Medicine | Admitting: Emergency Medicine

## 2022-05-11 DIAGNOSIS — B349 Viral infection, unspecified: Secondary | ICD-10-CM

## 2022-05-11 MED ORDER — ACETAMINOPHEN 500 MG PO TABS
1000.0000 mg | ORAL_TABLET | Freq: Once | ORAL | Status: AC
  Administered 2022-05-11: 1000 mg via ORAL
  Filled 2022-05-11: qty 2

## 2022-05-11 MED ORDER — KETOROLAC TROMETHAMINE 30 MG/ML IJ SOLN
30.0000 mg | Freq: Once | INTRAMUSCULAR | Status: AC
  Administered 2022-05-11: 30 mg via INTRAMUSCULAR
  Filled 2022-05-11: qty 1

## 2022-05-11 NOTE — Discharge Instructions (Addendum)
Please take Tylenol and ibuprofen/Advil for your pain.  It is safe to take them together, or to alternate them every few hours.  Take up to 1000mg of Tylenol at a time, up to 4 times per day.  Do not take more than 4000 mg of Tylenol in 24 hours.  For ibuprofen, take 400-600 mg, 3 - 4 times per day.  

## 2022-05-11 NOTE — ED Provider Notes (Signed)
[facility] Provider Note    Event Date/Time   First MD Initiated Contact with Patient 05/11/22 0110     (approximate)   History   Generalized Body Aches   HPI  PSALM SCHAPPELL is a 47 y.o. male who presents to the ED for evaluation of Generalized Body Aches   Patient presents to the ED for evaluation of 1 week of diffuse myalgias, headache and nonproductive cough.  No chest or abdominal pain, no syncope   Physical Exam   Triage Vital Signs: ED Triage Vitals  Enc Vitals Group     BP 05/10/22 2236 (!) 146/97     Pulse Rate 05/10/22 2236 (!) 112     Resp 05/10/22 2236 17     Temp 05/10/22 2236 98.8 F (37.1 C)     Temp Source 05/10/22 2236 Oral     SpO2 05/10/22 2236 98 %     Weight 05/10/22 2247 160 lb 15 oz (73 kg)     Height 05/10/22 2247 5\' 8"  (1.727 m)     Head Circumference --      Peak Flow --      Pain Score 05/10/22 2247 6     Pain Loc --      Pain Edu? --      Excl. in [place]? --     Most recent vital signs: Vitals:   05/11/22 0114 05/11/22 0134  BP:  (!) 154/97  Pulse:  90  Resp:  16  Temp: (!) 100.5 F (38.1 C)   SpO2:  100%    General: Awake, no distress.  CV:  Good peripheral perfusion.  Resp:  Normal effort.  Abd:  No distention.  MSK:  No deformity noted.  Neuro:  No focal deficits appreciated. Other:     ED Results / Procedures / Treatments   Labs (all labs ordered are listed, but only abnormal results are displayed) Labs Reviewed  RESP PANEL BY RT-PCR (RSV, FLU A&B, COVID)  [id number]    EKG   RADIOLOGY CXR interpreted by me without evidence of acute cardiopulmonary pathology.  Official radiology report(s): DG Chest 2 View  Result Date: 05/11/2022 CLINICAL DATA:  Cough EXAM: CHEST - 2 VIEW COMPARISON:  03/17/2022 FINDINGS: The lungs are symmetrically mildly hyperinflated suggesting changes of underlying COPD. Scattered bibasilar benign calcified granuloma are present. The lungs are otherwise clear. No  pneumothorax or pleural effusion. Cardiac size within normal limits. Pulmonary vascularity is normal. No acute bone abnormality. IMPRESSION: 1. No active cardiopulmonary disease. COPD. Electronically Signed   By: Fidela Salisbury M.D.   On: 05/11/2022 01:06    PROCEDURES and INTERVENTIONS:  Procedures  Medications  ketorolac (TORADOL) 30 MG/ML injection 30 mg (30 mg Intramuscular Given 05/11/22 0119)  acetaminophen (TYLENOL) tablet 1,000 mg (1,000 mg Oral Given 05/11/22 0119)     IMPRESSION / MDM / ASSESSMENT AND PLAN / ED COURSE  I reviewed the triage vital signs and the nursing notes.  Differential diagnosis includes, but is not limited to, viral syndrome, sepsis, pneumonia, flu  {Patient presents with symptoms of an acute illness or injury that is potentially life-threatening.  47 year old presents with a week of myalgias, low-grade fever and I suspect a viral syndrome.  Look systemically well and has reassuring examination.  CXR is clear without evidence of CAP or pneumothorax.  I see no indications to deploy antibiotics at this point.  We discussed OTC medications and return precautions.      FINAL CLINICAL IMPRESSION(S) /  ED DIAGNOSES   Final diagnoses:  Viral syndrome     Rx / DC Orders   ED Discharge Orders     None        Note:  This document was prepared using Dragon voice recognition software and may include unintentional dictation errors.   Vladimir Crofts, MD 05/11/22 [phone]

## 2022-05-12 ENCOUNTER — Emergency Department
Admission: EM | Admit: 2022-05-12 | Discharge: 2022-05-12 | Disposition: A | Discharge status: Home / Self Care | Payer: Medicaid Other | Attending: Emergency Medicine | Admitting: Emergency Medicine

## 2022-05-12 DIAGNOSIS — R21 Rash and other nonspecific skin eruption: Secondary | ICD-10-CM

## 2022-05-12 MED ORDER — CEPHALEXIN 500 MG PO CAPS: 1000.0000 mg | ORAL_CAPSULE | Freq: Two times a day (BID) | ORAL | 0 refills | Status: AC

## 2022-05-12 NOTE — ED Triage Notes (Signed)
Pt sts that he has been having lower right let pain. Pt sts that the last time he had this pain he had cellulitis in the leg. Pt leg appears to be pt normal skin tone.

## 2022-05-12 NOTE — Discharge Instructions (Signed)
-  I suspect that your rash is likely likely due to to an irritant and will go away on its own, however if it continues to spread, you may take the cephalexin as prescribed to treat for possible cellulitis.  -Return to the emergency department anytime if you begin to experience any new or worsening symptoms.

## 2022-05-12 NOTE — ED Provider Notes (Signed)
[facility] Provider Note    Event Date/Time   First MD Initiated Contact with Patient 05/12/22 1259     (approximate)   History   Chief Complaint Leg Pain   HPI Philip Owen is a 47 y.o. male, history of anxiety, GERD, presents to the emergency department for evaluation of rash to the right lower extremity.  He states that he first noticed it yesterday.  Reports a red rash on the distal end of his tibia with mild tenderness.  He states that he does have fever and myalgias, but is also currently battling a respiratory infection at this time.  He states that he has had cellulitis in his legs before and believes this may be the same thing.  Denies chest pain, shortness of breath, abdominal pain, flank pain, nausea/vomiting, diarrhea, claudication, urinary symptoms, headache, paresthesias, cold sensation, weakness, or dizziness/lightheadedness.  History Limitations: No limitations.        Physical Exam  Triage Vital Signs: ED Triage Vitals [05/12/22 1238]  Enc Vitals Group     BP (!) 141/90     Pulse Rate 99     Resp 17     Temp 98.5 F (36.9 C)     Temp Source Oral     SpO2 96 %     Weight 160 lb (72.6 kg)     Height      Head Circumference      Peak Flow      Pain Score 6     Pain Loc      Pain Edu?      Excl. in [place]?     Most recent vital signs: Vitals:   05/12/22 1238  BP: (!) 141/90  Pulse: 99  Resp: 17  Temp: 98.5 F (36.9 C)  SpO2: 96%    General: Awake, NAD.  Eyes: PERRL. Conjunctivae normal.  CV: Good peripheral perfusion.  Resp: Normal effort.  Abd: Soft, non-tender. No distention.  Neuro: At baseline. No gross neurological deficits.  Musculoskeletal: Normal ROM of all extremities.  Focused Exam: Very mild erythematous rash along the distal end of his right tibia.  No significant warmth or tenderness.  No pain with flexion of the foot.  PMS intact distally.  No tenderness in the popliteal or calf region.  Physical  Exam    ED Results / Procedures / Treatments  Labs (all labs ordered are listed, but only abnormal results are displayed) Labs Reviewed - No data to display   EKG N/A.    RADIOLOGY  ED Provider Interpretation: N/A.  No results found.  PROCEDURES:  Critical Care performed: N/A.  Procedures    MEDICATIONS ORDERED IN ED: Medications - No data to display   IMPRESSION / MDM / [place] / ED COURSE  I reviewed the triage vital signs and the nursing notes.                              Differential diagnosis includes, but is not limited to, cellulitis, contact dermatitis, allergic dermatitis, venous insufficiency.  Assessment/Plan Patient presents with rash along the distal end of his right tibia x 1 day.  He appears well clinically.  No signs of any systemic infection.  The rash does not appear to be obviously cellulitic.  I suspect that it is likely a contact/irritant dermatitis.  However, given his history of cellulitis in the past, will provide him with a prescription for  cephalexin to be taken if he begins to develop any systemic symptoms or if the rash extends beyond the borders that I have marked.  Advised him to avoid any irritants that may be causing this rash.  Encouraged him to follow with his primary care provider as needed.  He expressed understanding and agreed.  Will discharge.  Provided the patient with anticipatory guidance, return precautions, and educational material. Encouraged the patient to return to the emergency department at any time if they begin to experience any new or worsening symptoms. Patient expressed understanding and agreed with the plan.   Patient's presentation is most consistent with acute, uncomplicated illness.       FINAL CLINICAL IMPRESSION(S) / ED DIAGNOSES   Final diagnoses:  Rash     Rx / DC Orders   ED Discharge Orders          Ordered    cephALEXin (KEFLEX) 500 MG capsule  2 times daily        05/12/22  1342             Note:  This document was prepared using Dragon voice recognition software and may include unintentional dictation errors.   Teodoro Spray, [place] 05/12/22 1715    Blake Divine, MD 05/13/22 [phone]

## 2022-05-16 ENCOUNTER — Other Ambulatory Visit: Payer: Self-pay

## 2022-05-16 ENCOUNTER — Emergency Department
Admission: EM | Admit: 2022-05-16 | Discharge: 2022-05-17 | Disposition: A | Discharge status: Home / Self Care | Payer: Medicaid Other | Attending: Student in an Organized Health Care Education/Training Program | Admitting: Student in an Organized Health Care Education/Training Program

## 2022-05-16 ENCOUNTER — Encounter: Payer: Self-pay | Admitting: *Deleted

## 2022-05-16 DIAGNOSIS — L03115 Cellulitis of right lower limb: Secondary | ICD-10-CM | POA: Diagnosis not present

## 2022-05-16 DIAGNOSIS — R2241 Localized swelling, mass and lump, right lower limb: Secondary | ICD-10-CM | POA: Diagnosis present

## 2022-05-16 LAB — CBC WITH DIFFERENTIAL/PLATELET
Abs Immature Granulocytes: 0.03 10*3/uL (ref 0.00–0.07)
Basophils Absolute: 0 10*3/uL (ref 0.0–0.1)
Basophils Relative: 0 %
Eosinophils Absolute: 0.1 10*3/uL (ref 0.0–0.5)
Eosinophils Relative: 1 %
HCT: 40.9 % (ref 39.0–52.0)
Hemoglobin: 13.6 g/dL (ref 13.0–17.0)
Immature Granulocytes: 0 %
Lymphocytes Relative: 32 %
Lymphs Abs: 3.4 10*3/uL (ref 0.7–4.0)
MCH: 28.1 pg (ref 26.0–34.0)
MCHC: 33.3 g/dL (ref 30.0–36.0)
MCV: 84.5 fL (ref 80.0–100.0)
Monocytes Absolute: 0.9 10*3/uL (ref 0.1–1.0)
Monocytes Relative: 9 %
Neutro Abs: 6.2 10*3/uL (ref 1.7–7.7)
Neutrophils Relative %: 58 %
Platelets: 276 10*3/uL (ref 150–400)
RBC: 4.84 MIL/uL (ref 4.22–5.81)
RDW: 12.4 % (ref 11.5–15.5)
WBC: 10.7 10*3/uL — ABNORMAL HIGH (ref 4.0–10.5)
nRBC: 0 % (ref 0.0–0.2)

## 2022-05-16 LAB — COMPREHENSIVE METABOLIC PANEL
ALT: 61 U/L — ABNORMAL HIGH (ref 0–44)
AST: 46 U/L — ABNORMAL HIGH (ref 15–41)
Albumin: 4.2 g/dL (ref 3.5–5.0)
Alkaline Phosphatase: 66 U/L (ref 38–126)
Anion gap: 8 (ref 5–15)
BUN: 18 mg/dL (ref 6–20)
CO2: 24 mmol/L (ref 22–32)
Calcium: 9 mg/dL (ref 8.9–10.3)
Chloride: 106 mmol/L (ref 98–111)
Creatinine, Ser: 0.92 mg/dL (ref 0.61–1.24)
GFR, Estimated: >60 mL/min (ref >60)
Glucose, Bld: 77 mg/dL (ref 70–99)
Potassium: 3.6 mmol/L (ref 3.5–5.1)
Sodium: 138 mmol/L (ref 135–145)
Total Bilirubin: 0.7 mg/dL (ref 0.3–1.2)
Total Protein: 7.4 g/dL (ref 6.5–8.1)

## 2022-05-16 LAB — LACTIC ACID, PLASMA: Lactic Acid, Venous: 1.6 mmol/L (ref 0.5–1.9)

## 2022-05-16 MED ORDER — ONDANSETRON HCL 4 MG/2ML IJ SOLN
4.0000 mg | Freq: Once | INTRAMUSCULAR | Status: AC
  Administered 2022-05-16: 4 mg via INTRAVENOUS
  Filled 2022-05-16: qty 2

## 2022-05-16 MED ORDER — VANCOMYCIN HCL IN DEXTROSE 1-5 GM/200ML-% IV SOLN
1000.0000 mg | Freq: Once | INTRAVENOUS | Status: AC
  Administered 2022-05-16: 1000 mg via INTRAVENOUS
  Filled 2022-05-16: qty 200

## 2022-05-16 MED ORDER — HYDROCODONE-ACETAMINOPHEN 5-325 MG PO TABS: 1.0000 | ORAL_TABLET | ORAL | 0 refills | Status: DC | PRN

## 2022-05-16 MED ORDER — SODIUM CHLORIDE 0.9 % IV BOLUS
1000.0000 mL | Freq: Once | INTRAVENOUS | Status: AC
  Administered 2022-05-16: 1000 mL via INTRAVENOUS

## 2022-05-16 MED ORDER — CLINDAMYCIN HCL 300 MG PO CAPS: 300.0000 mg | ORAL_CAPSULE | Freq: Four times a day (QID) | ORAL | 0 refills | Status: AC

## 2022-05-16 MED ORDER — MORPHINE SULFATE (PF) 4 MG/ML IV SOLN
4.0000 mg | Freq: Once | INTRAVENOUS | Status: AC
  Administered 2022-05-16: 4 mg via INTRAVENOUS
  Filled 2022-05-16: qty 1

## 2022-05-16 MED ORDER — HYDROCODONE-ACETAMINOPHEN 5-325 MG PO TABS
1.0000 | ORAL_TABLET | Freq: Once | ORAL | Status: AC
  Administered 2022-05-17: 1 via ORAL
  Filled 2022-05-16: qty 1

## 2022-05-16 NOTE — ED Provider Notes (Signed)
[facility] Provider Note  Patient Contact: 10:41 PM (approximate)   History   Leg Swelling   HPI  Philip Owen is a 47 y.o. male who presents the emergency department complaining of worsening cellulitis to right lower extremity.  Patient states that he has had cellulitis 2 or 3 times in the past.  States that it always starts off small, typically worsens after the first round of oral antibiotics and he needs IV antibiotics.  Patient states that the pain is increasing, erythema and edema is increased into the right lower extremity.  He denies any purulent drainage.  Does not appear that he has had a history of recurring abscesses just recurrent cellulitis.  Patient denies any fevers, chills, chest pain, shortness of breath, GI symptoms.     Physical Exam   Triage Vital Signs: ED Triage Vitals  Enc Vitals Group     BP 05/16/22 2042 (!) 152/102     Pulse Rate 05/16/22 2042 (!) 108     Resp 05/16/22 2042 20     Temp 05/16/22 2042 98.4 F (36.9 C)     Temp Source 05/16/22 2042 Oral     SpO2 05/16/22 2042 98 %     Weight 05/16/22 2043 160 lb (72.6 kg)     Height 05/16/22 2043 5\' 8"  (1.727 m)     Head Circumference --      Peak Flow --      Pain Score 05/16/22 2058 10     Pain Loc --      Pain Edu? --      Excl. in [place]? --     Most recent vital signs: Vitals:   05/16/22 2042  BP: (!) 152/102  Pulse: (!) 108  Resp: 20  Temp: 98.4 F (36.9 C)  SpO2: 98%     General: Alert and in no acute distress. Cardiovascular:  Good peripheral perfusion Respiratory: Normal respiratory effort without tachypnea or retractions. Lungs CTAB.  Musculoskeletal: Full range of motion to all extremities.  Visualization of the right lower extremity reveals erythema, slight edema of the anterior shin.  This is cellulitic in nature.  There is no fluctuance.  No purulent drainage.  Does not involve the ankle joint or knee itself.  Does not appear that this is fully  circumferential with no calf involvement.  Area is warm to touch and very tender. Neurologic:  No gross focal neurologic deficits are appreciated.  Skin:   No rash noted Other:   ED Results / Procedures / Treatments   Labs (all labs ordered are listed, but only abnormal results are displayed) Labs Reviewed  COMPREHENSIVE METABOLIC PANEL - Abnormal; Notable for the following components:      Result Value   AST 46 (*)    ALT 61 (*)    All other components within normal limits  CBC WITH DIFFERENTIAL/PLATELET - Abnormal; Notable for the following components:   WBC 10.7 (*)    All other components within normal limits  LACTIC ACID, PLASMA     EKG     RADIOLOGY    No results found.  PROCEDURES:  Critical Care performed: No  Procedures   MEDICATIONS ORDERED IN ED: Medications  vancomycin (VANCOCIN) IVPB 1000 mg/200 mL premix (1,000 mg Intravenous New Bag/Given 05/16/22 2302)  HYDROcodone-acetaminophen (NORCO/VICODIN) 5-325 MG per tablet 1 tablet (has no administration in time range)  sodium chloride 0.9 % bolus 1,000 mL (1,000 mLs Intravenous New Bag/Given 05/16/22 2300)  morphine (PF) 4  MG/ML injection 4 mg (4 mg Intravenous Given 05/16/22 2300)  ondansetron (ZOFRAN) injection 4 mg (4 mg Intravenous Given 05/16/22 2300)     IMPRESSION / MDM / ASSESSMENT AND PLAN / ED COURSE  I reviewed the triage vital signs and the nursing notes.                                 Differential diagnosis includes, but is not limited to, cellulitis, abscess, dermatitis, eczema, psoriasis   Patient's presentation is most consistent with acute presentation with potential threat to life or bodily function.   Patient's diagnosis is consistent with cellulitis of the right lower extremity.  Patient presents the emergency department with worsening cellulitis of right lower extremity.  History of same in the past.  Never had MRSA according to the patient.  Patient had been on Keflex with  worsening.  No evidence of abscess.  No concern for septic arthritis.  Patient's labs are reassuring with a slight bump in his white blood cell count, lactic is reassuring.  Vitals are reassuring.  Patient has received fluids and IV vancomycin.  Patient will be discharged with clindamycin and limited pain medication.  Return precautions discussed with the patient.  Follow-up primary care as needed..  Patient is given ED precautions to return to the ED for any worsening or new symptoms.     FINAL CLINICAL IMPRESSION(S) / ED DIAGNOSES   Final diagnoses:  Cellulitis of right lower extremity     Rx / DC Orders   ED Discharge Orders          Ordered    clindamycin (CLEOCIN) 300 MG capsule  4 times daily        05/16/22 2330    HYDROcodone-acetaminophen (NORCO/VICODIN) 5-325 MG tablet  Every 4 hours PRN        05/16/22 2330             Note:  This document was prepared using Dragon voice recognition software and may include unintentional dictation errors.   Brynda Peon 05/16/22 2333    Merlyn Lot, MD 05/17/22 [phone]

## 2022-05-16 NOTE — ED Triage Notes (Signed)
Pt has swelling to right lower leg.  Pt taking abx for cellulitis.  Pt states leg isn't any better.  Pt alert.

## 2022-05-17 NOTE — ED Notes (Signed)
IV not charted, Probation officer took out IV after ATB completed. Catheter intact, dressing clean/dry/intact.

## 2022-08-24 ENCOUNTER — Emergency Department
Admission: EM | Admit: 2022-08-24 | Discharge: 2022-08-24 | Disposition: A | Discharge status: Home / Self Care | Payer: Medicaid Other | Attending: Emergency Medicine | Admitting: Emergency Medicine

## 2022-08-24 ENCOUNTER — Other Ambulatory Visit: Payer: Self-pay

## 2022-08-24 DIAGNOSIS — W57XXXA Bitten or stung by nonvenomous insect and other nonvenomous arthropods, initial encounter: Secondary | ICD-10-CM | POA: Diagnosis not present

## 2022-08-24 DIAGNOSIS — S60561A Insect bite (nonvenomous) of right hand, initial encounter: Secondary | ICD-10-CM | POA: Diagnosis present

## 2022-08-24 MED ORDER — DIPHENHYDRAMINE HCL 50 MG/ML IJ SOLN
50.0000 mg | Freq: Once | INTRAMUSCULAR | Status: DC
  Filled 2022-08-24: qty 1

## 2022-08-24 MED ORDER — DEXAMETHASONE SODIUM PHOSPHATE 10 MG/ML IJ SOLN
10.0000 mg | Freq: Once | INTRAMUSCULAR | Status: AC
  Administered 2022-08-24: 10 mg via INTRAMUSCULAR
  Filled 2022-08-24: qty 1

## 2022-08-24 MED ORDER — DIPHENHYDRAMINE HCL 25 MG PO CAPS
50.0000 mg | ORAL_CAPSULE | Freq: Once | ORAL | Status: AC
  Administered 2022-08-24: 50 mg via ORAL
  Filled 2022-08-24: qty 2

## 2022-08-24 MED ORDER — PREDNISONE 20 MG PO TABS: ORAL_TABLET | ORAL | 0 refills | Status: DC

## 2022-08-24 NOTE — Discharge Instructions (Signed)
Follow-up with your primary care provider if any continued problems or urgent care as needed.  It appears that you are having a localized reaction to an insect bite of some description.  You may use over-the-counter cortisone cream to rub into the area and also a prescription for prednisone was sent to the pharmacy.  You may also obtain Benadryl over-the-counter and take 1 or 2 capsules every 6 hours as needed for itching.  Return to the emergency department if any severe worsening of your symptoms or urgent concerns.

## 2022-08-24 NOTE — ED Triage Notes (Signed)
Pt to ED for possible insect bite to right hand. States has had some pain and redness to right hand in past hour.

## 2022-08-24 NOTE — ED Provider Notes (Signed)
[facility] Provider Note    Event Date/Time   First MD Initiated Contact with Patient 08/24/22 1431     (approximate)   History   Hand Pain   HPI  Philip Owen is a 47 y.o. male   presents to the ED with complaint of possible insect bite and reaction on his right hand.  Patient states that he was at a ball game couple of hours ago and was possibly bitten by something.  He did not see an insect but states that his hand started getting red and slightly swollen.  He denies any difficulty breathing or shortness of breath.  Patient denies any previous allergies to bees stings.  Patient has a history of smoking, reflux, anxiety.      Physical Exam   Triage Vital Signs: ED Triage Vitals  Enc Vitals Group     BP 08/24/22 1427 133/87     Pulse Rate 08/24/22 1427 89     Resp 08/24/22 1427 16     Temp 08/24/22 1427 98.8 F (37.1 C)     Temp src --      SpO2 08/24/22 1427 98 %     Weight 08/24/22 1426 165 lb (74.8 kg)     Height 08/24/22 1426  (1.727 m)     Head Circumference --      Peak Flow --      Pain Score 08/24/22 1426 6     Pain Loc --      Pain Edu? --      Excl. in [place]? --     Most recent vital signs: Vitals:   08/24/22 1427  BP: 133/87  Pulse: 89  Resp: 16  Temp: 98.8 F (37.1 C)  SpO2: 98%     General: Awake, no distress.  Patient is able to talk in complete sentences without any difficulty. CV:  Good peripheral perfusion.  Resp:  Normal effort.  Lungs are clear bilaterally. Abd:  No distention.  Other:  Right hand dorsal aspect base of the index finger there is a small erythematous papule that could resemble an insect bite or sting.  There is some mild warmth associated to this area.  Patient is able to fully extend and flex his digits without any difficulty.  Radial pulse present.  Capillary refills less than 3 seconds.   ED Results / Procedures / Treatments   Labs (all labs ordered are listed, but only abnormal  results are displayed) Labs Reviewed - No data to display     PROCEDURES:  Critical Care performed:   Procedures   MEDICATIONS ORDERED IN ED: Medications  dexamethasone (DECADRON) injection 10 mg (10 mg Intramuscular Given 08/24/22 1454)  diphenhydrAMINE (BENADRYL) capsule 50 mg (50 mg Oral Given 08/24/22 1459)     IMPRESSION / MDM / ASSESSMENT AND PLAN / ED COURSE  I reviewed the triage vital signs and the nursing notes.   Differential diagnosis includes, but is not limited to, insect bite, bee sting, spider bite, localized reaction.  ----------------------------------------- 3:28 PM on 08/24/2022 ----------------------------------------- Reevaluation of patient's right hand does not show any worsening of his symptoms and patient remains the same.  No difficulty breathing and patient is able to talk in complete sentences without any difficulty.  47 year old male presents to the ED complaining of insect bite to his right hand.  He denies any difficulty breathing or previous anaphylactic reactions.  Patient was given Benadryl 50 mg p.o. and Decadron 10 mg IM.  No worsening of his symptoms while in the emergency department.  Prescription for prednisone was sent to the pharmacy to begin taking tomorrow for the next 4 days.  He is instructed to obtain over-the-counter cortisone cream to rub into the area locally and Benadryl as needed for itching.  He is to return to the emergency department if any severe worsening of his symptoms.      Patient's presentation is most consistent with acute, uncomplicated illness.  FINAL CLINICAL IMPRESSION(S) / ED DIAGNOSES   Final diagnoses:  Insect bite hand, right, initial encounter     Rx / DC Orders   ED Discharge Orders          Ordered    predniSONE (DELTASONE) 20 MG tablet        08/24/22 1526             Note:  This document was prepared using Dragon voice recognition software and may include unintentional dictation  errors.   Tommi Rumps, PA-C 08/24/22 1537    Georga Hacking, MD 08/26/22 [phone]

## 2022-08-28 ENCOUNTER — Other Ambulatory Visit: Payer: Self-pay

## 2022-08-28 ENCOUNTER — Encounter: Payer: Self-pay | Admitting: Emergency Medicine

## 2022-08-28 ENCOUNTER — Emergency Department
Admission: EM | Admit: 2022-08-28 | Discharge: 2022-08-28 | Discharge status: Against Medical Advice | Payer: Medicaid Other | Attending: Emergency Medicine | Admitting: Emergency Medicine

## 2022-08-28 DIAGNOSIS — X58XXXA Exposure to other specified factors, initial encounter: Secondary | ICD-10-CM | POA: Insufficient documentation

## 2022-08-28 DIAGNOSIS — S90822A Blister (nonthermal), left foot, initial encounter: Secondary | ICD-10-CM | POA: Insufficient documentation

## 2022-08-28 DIAGNOSIS — Z5321 Procedure and treatment not carried out due to patient leaving prior to being seen by health care provider: Secondary | ICD-10-CM | POA: Insufficient documentation

## 2022-08-28 NOTE — ED Triage Notes (Signed)
Patient reports blister on the bottom of his left foot x 2 days.  Patient reports he's been walking a lot.

## 2022-08-28 NOTE — ED Notes (Signed)
Pt called multiple times for room. Pt not visualized in lobby or outside.

## 2022-09-01 ENCOUNTER — Emergency Department
Admission: EM | Admit: 2022-09-01 | Discharge: 2022-09-01 | Disposition: A | Discharge status: Home / Self Care | Payer: Medicaid Other | Attending: Emergency Medicine | Admitting: Emergency Medicine

## 2022-09-01 ENCOUNTER — Other Ambulatory Visit: Payer: Self-pay

## 2022-09-01 DIAGNOSIS — S90822A Blister (nonthermal), left foot, initial encounter: Secondary | ICD-10-CM

## 2022-09-01 DIAGNOSIS — S90821A Blister (nonthermal), right foot, initial encounter: Secondary | ICD-10-CM

## 2022-09-01 DIAGNOSIS — X58XXXA Exposure to other specified factors, initial encounter: Secondary | ICD-10-CM | POA: Insufficient documentation

## 2022-09-01 MED ORDER — CEPHALEXIN 500 MG PO CAPS: 500.0000 mg | ORAL_CAPSULE | Freq: Three times a day (TID) | ORAL | 0 refills | Status: AC

## 2022-09-01 NOTE — ED Triage Notes (Signed)
Pt presents to Ed with /co of bilateral foot pain, pt states blisters. NAD noted. Pt walking with no issues.

## 2022-09-01 NOTE — ED Provider Notes (Signed)
[facility] Emergency Department Provider Note     Event Date/Time   First MD Initiated Contact with Patient 09/01/22 1235     (approximate)   History   Foot Pain   HPI  Philip Owen is a 47 y.o. male with a history of anxiety and GERD presents to the ED for evaluation of bilateral foot pain due to blisters.  Patient presents with concern for some skin breakdown to his feet and toes secondary to her high doses of his feet.  He denies any fevers, chills, or sweats.  He does not endorse any new shoes oriole fitting shoes at this time.   Physical Exam   Triage Vital Signs: ED Triage Vitals  Enc Vitals Group     BP 09/01/22 1222 (!) 149/98     Pulse --      Resp 09/01/22 1222 18     Temp 09/01/22 1222 98.4 F (36.9 C)     Temp Source 09/01/22 1222 Oral     SpO2 09/01/22 1222 95 %     Weight --      Height --      Head Circumference --      Peak Flow --      Pain Score 09/01/22 1223 6     Pain Loc --      Pain Edu? --      Excl. in [place]? --     Most recent vital signs: Vitals:   09/01/22 1222  BP: (!) 149/98  Resp: 18  Temp: 98.4 F (36.9 C)  SpO2: 95%    General Awake, no distress. NAD CV:  Good peripheral perfusion.  RESP:  Normal effort.  ABD:  No distention.  SKIN:  Shorts several macerated but chronic appearing areas of blistering skin breakdown primarily to the pinky toes bilaterally.  Some additional callus formation noted to the lateral heels.  No lymphangitis, induration, warmth, or fluctuance is noted.   ED Results / Procedures / Treatments   Labs (all labs ordered are listed, but only abnormal results are displayed) Labs Reviewed - No data to display   EKG   RADIOLOGY  No results found.   PROCEDURES:  Critical Care performed: No  Procedures   MEDICATIONS ORDERED IN ED: Medications - No data to display   IMPRESSION / MDM / ASSESSMENT AND PLAN / ED COURSE  I reviewed the triage vital signs and the  nursing notes.                              Differential diagnosis includes, but is not limited to, hammertoe deformity, blister formation, heel spurs, pes planus  Patient's presentation is most consistent with acute, uncomplicated illness.  Patient's diagnosis is consistent with friction blisters to the feet.  No evidence of any acute cellulitis noted.  Patient will be treated however, empirically with prescription for Keflex.  Patient is encouraged to keep the feet clean and dry.  Also advised to apply dry sterile dressings to the feet and toes to help prevent further friction and skin breakdown.  Patient is to follow up with podiatry as needed or otherwise directed. Patient is given ED precautions to return to the ED for any worsening or new symptoms.    FINAL CLINICAL IMPRESSION(S) / ED DIAGNOSES   Final diagnoses:  Friction blisters of sole of left foot, initial encounter  Friction blisters of sole of right foot, initial  encounter     Rx / DC Orders   ED Discharge Orders          Ordered    cephALEXin (KEFLEX) 500 MG capsule  3 times daily        09/01/22 1259             Note:  This document was prepared using Dragon voice recognition software and may include unintentional dictation errors.    Lissa Hoard, PA-C 09/01/22 2147    Jene Every, MD 09/06/22 [phone]

## 2022-09-01 NOTE — Discharge Instructions (Addendum)
Keep your feet clean and dry using OTC antifungal powders or sprays.  Keep the blisters covered and wear well-fitted shoes.  Take the antibiotic as directed.  Follow-up with podiatry for ongoing concerns.

## 2022-09-17 ENCOUNTER — Other Ambulatory Visit: Payer: Self-pay

## 2022-09-17 ENCOUNTER — Emergency Department: Payer: Medicaid Other

## 2022-09-17 ENCOUNTER — Emergency Department
Admission: EM | Admit: 2022-09-17 | Discharge: 2022-09-17 | Disposition: A | Discharge status: Home / Self Care | Payer: Medicaid Other | Attending: Emergency Medicine | Admitting: Emergency Medicine

## 2022-09-17 DIAGNOSIS — Z1152 Encounter for screening for COVID-19: Secondary | ICD-10-CM | POA: Insufficient documentation

## 2022-09-17 DIAGNOSIS — F17210 Nicotine dependence, cigarettes, uncomplicated: Secondary | ICD-10-CM | POA: Diagnosis not present

## 2022-09-17 DIAGNOSIS — J209 Acute bronchitis, unspecified: Secondary | ICD-10-CM | POA: Diagnosis not present

## 2022-09-17 DIAGNOSIS — J40 Bronchitis, not specified as acute or chronic: Secondary | ICD-10-CM

## 2022-09-17 DIAGNOSIS — R911 Solitary pulmonary nodule: Secondary | ICD-10-CM | POA: Insufficient documentation

## 2022-09-17 DIAGNOSIS — R059 Cough, unspecified: Secondary | ICD-10-CM | POA: Diagnosis present

## 2022-09-17 LAB — GROUP A STREP BY PCR: Group A Strep by PCR: NOT DETECTED

## 2022-09-17 LAB — RESP PANEL BY RT-PCR (RSV, FLU A&B, COVID)  RVPGX2
Influenza A by PCR: NEGATIVE
Influenza B by PCR: NEGATIVE
Resp Syncytial Virus by PCR: NEGATIVE
SARS Coronavirus 2 by RT PCR: NEGATIVE

## 2022-09-17 MED ORDER — ALBUTEROL SULFATE HFA 108 (90 BASE) MCG/ACT IN AERS: 2.0000 | INHALATION_SPRAY | Freq: Four times a day (QID) | RESPIRATORY_TRACT | 2 refills | Status: AC | PRN

## 2022-09-17 MED ORDER — BENZONATATE 100 MG PO CAPS: 100.0000 mg | ORAL_CAPSULE | Freq: Three times a day (TID) | ORAL | 0 refills | Status: AC | PRN

## 2022-09-17 MED ORDER — DOXYCYCLINE MONOHYDRATE 100 MG PO TABS: 100.0000 mg | ORAL_TABLET | Freq: Two times a day (BID) | ORAL | 0 refills | Status: AC

## 2022-09-17 NOTE — ED Provider Notes (Signed)
[facility] Provider Note    Event Date/Time   First MD Initiated Contact with Patient 09/17/22 1519     (approximate)   History   Cough (X3 days)   HPI  Philip Owen is a 47 y.o. male who comes in with a cough for the past 2 days.  He denies any fevers chills shortness of breath.  He does report smoking a half a pack daily.  Patient reports that he has been coughing up some yellow sputum, had some scratchiness in his throat.  He denies anyone else being sick at home.  He states that he has not had any chest pain or shortness of breath.  He denies any risk factors for pulmonary embolism.  Denies any blood in his sputum, leg swelling, recent surgery or recent travel.  No history of blood clots.  Denies any history of cancer.     Physical Exam   Triage Vital Signs: ED Triage Vitals  Enc Vitals Group     BP 09/17/22 1501 131/77     Pulse Rate 09/17/22 1501 87     Resp 09/17/22 1501 18     Temp 09/17/22 1501 98.6 F (37 C)     Temp Source 09/17/22 1501 Oral     SpO2 09/17/22 1501 96 %     Weight 09/17/22 1504 165 lb (74.8 kg)     Height 09/17/22 1504 5\' 8"  (1.727 m)     Head Circumference --      Peak Flow --      Pain Score 09/17/22 1507 0     Pain Loc --      Pain Edu? --      Excl. in GC? --     Most recent vital signs: Vitals:   09/17/22 1501  BP: 131/77  Pulse: 87  Resp: 18  Temp: 98.6 F (37 C)  SpO2: 96%     General: Awake, no distress.  CV:  Good peripheral perfusion.  Resp:  Normal effort.  Clear lungs, no wheezing Abd:  No distention.  Other:  No swelling in legs.  No calf tenderness OP is clear- no swelling uvula midline   ED Results / Procedures / Treatments   Labs (all labs ordered are listed, but only abnormal results are displayed) Labs Reviewed  GROUP A STREP BY PCR  RESP PANEL BY RT-PCR (RSV, FLU A&B, COVID)  [id number]       RADIOLOGY I have reviewed the xray personally and interpreted and looks  consistent with COPD.  PROCEDURES:  Critical Care performed: No  Procedures   MEDICATIONS ORDERED IN ED: Medications - No data to display   IMPRESSION / MDM / ASSESSMENT AND PLAN / ED COURSE  I reviewed the triage vital signs and the nursing notes.   Patient's presentation is most consistent with acute presentation with potential threat to life or bodily function.   Patient comes in with cough in the setting of smoking history.  X-ray ordered to evaluate for any pneumonia, COVID, flu ordered.  Given a scratchy throat strep test ordered.  Strep test was negative.  COVID test was negative.  Chest x-ray as below  Hyperinflation and interstitial thickening, consistent with COPD/chronic bronchitis.  Apparent left upper lobe nodular density. If there are leads overlying the chest, recommend removal and repeat frontal radiograph. If no overlying leads, pulmonary nodule is a concern and chest CT suggested.   Patient did not have any leads noted so I discussed the  case with the radiologist Dr.Talbot-and a CT without contrast would be adequate to evaluate for nodule.  I considered doing a CT PE but patient's vitals are stable he is PERC negative he has no shortness of breath no pleuritic chest pain so my suspicion for PE was very low.  His only concern was a productive cough for the past 2 days.  He was willing to undergo the CT without contrast to evaluate for possible nodule.   IMPRESSION: 1. Irregularly marginated 1.4 cm pulmonary nodule within the right lower lobe. Consider one of the following in 3 months for both low-risk and high-risk individuals: (a) repeat chest CT, (b) follow-up PET-CT, or (c) tissue sampling. This recommendation follows the consensus statement: Guidelines for Management of Incidental Pulmonary Nodules Detected on CT Images: From the Fleischner Society 2017; Radiology 2017; 284:228-243. 2. No additional sites of airspace consolidation or  nodularity. Specifically, no left upper lobe pulmonary nodule. Previously described radiographic abnormality at this location was artifactual. 3. Mild centrilobular emphysema. 4. Small hiatal hernia.  Will refer patient to the nodule clinic team and also put in a referral for oncology.  I did discuss needing close follow-up for this and he expressed understanding.  Given the potential of potential tissue sampling we will hold off on any steroids at this time as I do not want to affect tissue sampling.  Will start off with albuterol inhaler, Tessalon Perles, course of doxycycline for potential bronchitis.  We discussed smoking cessation.  Patient expressed understanding felt comfortable with discharge home   FINAL CLINICAL IMPRESSION(S) / ED DIAGNOSES   Final diagnoses:  Bronchitis  Pulmonary nodule     Rx / DC Orders   ED Discharge Orders          Ordered    AMB  Referral to Pulmonary Nodule Clinic        09/17/22 1733    Ambulatory referral to Hematology / Oncology        09/17/22 1738    albuterol (VENTOLIN HFA) 108 (90 Base) MCG/ACT inhaler  Every 6 hours PRN        09/17/22 1739    doxycycline (ADOXA) 100 MG tablet  2 times daily        09/17/22 1739    benzonatate (TESSALON PERLES) 100 MG capsule  3 times daily PRN        09/17/22 1739             Note:  This document was prepared using Dragon voice recognition software and may include unintentional dictation errors.   Concha Se, MD 09/17/22 1739

## 2022-09-17 NOTE — Discharge Instructions (Addendum)
Use the inhaler if you develop any shortness of breath, cough drops and the antibiotics in case there could be a forming bacterial but I suspect this is most likely a viral illness.  You need to follow-up for the nodule as we discussed and return to the ER if you develop shortness of breath, coughing up blood, fevers or any other concerns    1. Irregularly marginated 1.4 cm pulmonary nodule within the right  lower lobe. Consider one of the following in 3 months for both  low-risk and high-risk individuals: (a) repeat chest CT, (b)  follow-up PET-CT, or (c) tissue sampling. This recommendation  follows the consensus statement: Guidelines for Management of  Incidental Pulmonary Nodules Detected on CT Images: From the  Fleischner Society 2017; Radiology 2017; 284:228-243.  2. No additional sites of airspace consolidation or nodularity.  Specifically, no left upper lobe pulmonary nodule. Previously  described radiographic abnormality at this location was artifactual.  3. Mild centrilobular emphysema.  4. Small hiatal hernia.

## 2022-09-17 NOTE — ED Triage Notes (Signed)
Pt to ED from home for cough x 2 days. Pt denies any fever, chills or SOB. Pt is CAOx4, and in no acute distress, and ambulatory in triage. Pt is a current 1/2 pack a day smoker. Pt has not taken any OTC meds at home to alleviate the cough.

## 2022-11-10 ENCOUNTER — Other Ambulatory Visit: Payer: Self-pay

## 2022-11-10 ENCOUNTER — Emergency Department
Admission: EM | Admit: 2022-11-10 | Discharge: 2022-11-10 | Disposition: A | Discharge status: Home / Self Care | Payer: MEDICAID | Attending: Emergency Medicine | Admitting: Emergency Medicine

## 2022-11-10 ENCOUNTER — Encounter: Payer: Self-pay | Admitting: Emergency Medicine

## 2022-11-10 DIAGNOSIS — W57XXXA Bitten or stung by nonvenomous insect and other nonvenomous arthropods, initial encounter: Secondary | ICD-10-CM | POA: Insufficient documentation

## 2022-11-10 DIAGNOSIS — R21 Rash and other nonspecific skin eruption: Secondary | ICD-10-CM | POA: Insufficient documentation

## 2022-11-10 MED ORDER — BACITRACIN 500 UNIT/GM EX OINT: 1.0000 | TOPICAL_OINTMENT | Freq: Two times a day (BID) | CUTANEOUS | 0 refills | Status: DC

## 2022-11-10 MED ORDER — CEPHALEXIN 500 MG PO CAPS: 500.0000 mg | ORAL_CAPSULE | Freq: Two times a day (BID) | ORAL | 0 refills | Status: AC

## 2022-11-10 MED ORDER — DIPHENHYDRAMINE HCL 2 % EX CREA: TOPICAL_CREAM | Freq: Three times a day (TID) | CUTANEOUS | 0 refills | Status: AC | PRN

## 2022-11-10 NOTE — Discharge Instructions (Addendum)
Take over-the-counter Benadryl up to 50 mg every 6 hours for itching.  Apply antibiotic topical ointment over affected area.  Follow-up with your primary care.  Closely monitor surrounding skin for increased redness or pain.  Please go to the following website to schedule new (and existing) patient appointments:   [email or web address]   The following is a list of primary care offices in the area who are accepting new patients at this time.  Please reach out to one of them directly and let them know you would like to schedule an appointment to follow up on an Emergency Department visit, and/or to establish a new primary care provider (PCP).  There are likely other primary care clinics in the are who are accepting new patients, but this is an excellent place to start:  [facility] Lead physician: Dr Shirlee Latch [address] [place], [place] [address] [phone]  [facility] Lead Physician: Dr Alba Cory [address], [place], [place] [address] [phone]  [facility]  Lead Physician: Dr Olevia Perches [address] [place], [place] [address] [phone]  [facility] Lead Physician: Dr Sofie Hartigan [address] [place], [place] [address] [phone]  [facility] Lead Physician: Dr Bari Edward [address] [place], [place], [place] [address] [phone]

## 2022-11-10 NOTE — ED Provider Notes (Signed)
[facility] Emergency Department Provider Note     Event Date/Time   First MD Initiated Contact with Patient 11/10/22 1726     (approximate)   History   Insect Bite   HPI  Philip Owen is a 47 y.o. male with a history of anxiety and GERD presents to the ED for possible insect bite x 2 days.  Patient believes he was bitten by a insect while he was barefooted in the woods for extended amount of time.  Pruritic in nature. unknown insect per patient.  Patient believes possible ants or spider, cannot recall. Patient has cleaning area with hydrogen peroxide.  Denies rash or lesion anywhere else on body.  Denies systemic symptoms.  Denies shortness of breath.   Physical Exam   Triage Vital Signs: ED Triage Vitals  Encounter Vitals Group     BP 11/10/22 1704 130/71     Systolic BP Percentile --      Diastolic BP Percentile --      Pulse Rate 11/10/22 1704 77     Resp 11/10/22 1704 18     Temp 11/10/22 1704 98.2 F (36.8 C)     Temp Source 11/10/22 1704 Oral     SpO2 11/10/22 1704 100 %     Weight --      Height --      Head Circumference --      Peak Flow --      Pain Score 11/10/22 1703 5     Pain Loc --      Pain Education --      Exclude from Growth Chart --     Most recent vital signs: Vitals:   11/10/22 1704  BP: 130/71  Pulse: 77  Resp: 18  Temp: 98.2 F (36.8 C)  SpO2: 100%    General Awake, no distress.  Nontoxic. HEENT NCAT. PERRL. EOMI. No rhinorrhea.  CV:  Good peripheral perfusion.  RESP:  Normal effort.  Airway patent. ABD:  No distention.  Other:  Left anterior ankle reveals small linear and superficial abrasion. There is another superficial abrasion over antero-lateral aspect of left ankle. No stinger or pinpoint bite noted.    ED Results / Procedures / Treatments   Labs (all labs ordered are listed, but only abnormal results are displayed) Labs Reviewed - No data to display  History and physical examination do  not warrant a lab work up or imaging at this time.   No results found.  PROCEDURES:  Critical Care performed: No  Procedures  MEDICATIONS ORDERED IN ED: Medications - No data to display  IMPRESSION / MDM / ASSESSMENT AND PLAN / ED COURSE  I reviewed the triage vital signs and the nursing notes.                               47 y.o. male presents to the emergency department for evaluation and treatment of possible insect bite. See HPI for further details.   Differential diagnosis includes, but is not limited to insect bite, cellulitis, poison ivy, contact dermatitis.  Physical exam findings are overall benign.  There are no signs of systemic reaction or symptoms at this time.  Patient reports insect bite however he is unable to name specific insect.  Skin lesion appears raw and pink but no signs of cellulitis.  Patient reports he has been scratching extensively.  Given this I have decided to prescribe  oral antibiotics.  I encouraged patient to continue hydrogen peroxide use and clean lesion with warm soap and water and dressed properly.  He is also prescribed antiitch ointment.  He is in stable condition for discharge and outpatient follow-up with his primary care.  I encouraged Benadryl up to 50 mg every 6 hours for itching and to closely monitor surrounding skin for increased redness or pain. Patient is given ED precautions to return to the ED for any worsening or new symptoms. Patient verbalizes understanding. All questions and concerns were addressed during ED visit.    Patient's presentation is most consistent with acute, uncomplicated illness.  FINAL CLINICAL IMPRESSION(S) / ED DIAGNOSES   Final diagnoses:  Rash   Rx / DC Orders   ED Discharge Orders          Ordered    bacitracin 500 UNIT/GM ointment  2 times daily,   Status:  Discontinued        11/10/22 1739    cephALEXin (KEFLEX) 500 MG capsule  2 times daily        11/10/22 1741    diphenhydrAMINE (BENADRYL) 2 %  cream  3 times daily PRN        11/10/22 1746             Note:  This document was prepared using Dragon voice recognition software and may include unintentional dictation errors.    Romeo Apple, [place] Maiorino A, PA-C 11/10/22 1813    Pilar Jarvis, MD 11/10/22 1945

## 2022-11-10 NOTE — ED Triage Notes (Signed)
Patient to ED via POV for insect bite on left ankle. Pt states he has been scratching at area.
